# Patient Record
Sex: Male | Born: 1955 | Race: White | Hispanic: No | Marital: Married | Smoking: Current every day smoker
Health system: Southern US, Community
[De-identification: ages and names within clinical notes are randomized; demographics above are authoritative.]

## PROBLEM LIST (undated history)

## (undated) DIAGNOSIS — F418 Other specified anxiety disorders: Secondary | ICD-10-CM

## (undated) DIAGNOSIS — G4730 Sleep apnea, unspecified: Secondary | ICD-10-CM

## (undated) DIAGNOSIS — F410 Panic disorder [episodic paroxysmal anxiety] without agoraphobia: Secondary | ICD-10-CM

## (undated) DIAGNOSIS — Z8669 Personal history of other diseases of the nervous system and sense organs: Secondary | ICD-10-CM

## (undated) DIAGNOSIS — I10 Essential (primary) hypertension: Secondary | ICD-10-CM

## (undated) DIAGNOSIS — E785 Hyperlipidemia, unspecified: Secondary | ICD-10-CM

## (undated) DIAGNOSIS — E6609 Other obesity due to excess calories: Secondary | ICD-10-CM

## (undated) DIAGNOSIS — C801 Malignant (primary) neoplasm, unspecified: Secondary | ICD-10-CM

## (undated) DIAGNOSIS — M1710 Unilateral primary osteoarthritis, unspecified knee: Secondary | ICD-10-CM

## (undated) DIAGNOSIS — I8390 Asymptomatic varicose veins of unspecified lower extremity: Secondary | ICD-10-CM

## (undated) DIAGNOSIS — F17200 Nicotine dependence, unspecified, uncomplicated: Secondary | ICD-10-CM

## (undated) DIAGNOSIS — I2510 Atherosclerotic heart disease of native coronary artery without angina pectoris: Secondary | ICD-10-CM

## (undated) HISTORY — DX: Essential (primary) hypertension: I10

## (undated) HISTORY — PX: TONSILLECTOMY: SUR1361

## (undated) HISTORY — DX: Panic disorder (episodic paroxysmal anxiety): F41.0

## (undated) HISTORY — DX: Nicotine dependence, unspecified, uncomplicated: F17.200

## (undated) HISTORY — DX: Other specified anxiety disorders: F41.8

## (undated) HISTORY — DX: Atherosclerotic heart disease of native coronary artery without angina pectoris: I25.10

## (undated) HISTORY — DX: Other obesity due to excess calories: E66.09

## (undated) HISTORY — DX: Hyperlipidemia, unspecified: E78.5

## (undated) HISTORY — DX: Personal history of other diseases of the nervous system and sense organs: Z86.69

## (undated) HISTORY — DX: Unilateral primary osteoarthritis, unspecified knee: M17.10

## (undated) HISTORY — DX: Asymptomatic varicose veins of unspecified lower extremity: I83.90

---

## 1998-10-11 ENCOUNTER — Ambulatory Visit (HOSPITAL_COMMUNITY): Admission: RE | Admit: 1998-10-11 | Discharge: 1998-10-11 | Payer: Self-pay | Admitting: Family Medicine

## 1998-10-15 ENCOUNTER — Ambulatory Visit: Admission: RE | Admit: 1998-10-15 | Discharge: 1998-10-15 | Payer: Self-pay | Admitting: Family Medicine

## 1998-11-17 ENCOUNTER — Ambulatory Visit: Admission: RE | Admit: 1998-11-17 | Discharge: 1998-11-17 | Payer: Self-pay | Admitting: Family Medicine

## 1999-07-02 ENCOUNTER — Emergency Department (HOSPITAL_COMMUNITY): Admission: EM | Admit: 1999-07-02 | Discharge: 1999-07-02 | Payer: Self-pay | Admitting: Emergency Medicine

## 2002-02-18 ENCOUNTER — Observation Stay (HOSPITAL_COMMUNITY): Admission: EM | Admit: 2002-02-18 | Discharge: 2002-02-19 | Payer: Self-pay | Admitting: *Deleted

## 2002-02-18 ENCOUNTER — Encounter: Payer: Self-pay | Admitting: *Deleted

## 2003-06-03 ENCOUNTER — Encounter: Admission: RE | Admit: 2003-06-03 | Discharge: 2003-06-03 | Payer: Self-pay | Admitting: Family Medicine

## 2003-06-03 ENCOUNTER — Encounter: Payer: Self-pay | Admitting: Family Medicine

## 2004-08-27 HISTORY — PX: EYE SURGERY: SHX253

## 2006-08-27 HISTORY — PX: OTHER SURGICAL HISTORY: SHX169

## 2006-12-08 ENCOUNTER — Inpatient Hospital Stay (HOSPITAL_COMMUNITY): Admission: EM | Admit: 2006-12-08 | Discharge: 2006-12-12 | Payer: Self-pay | Admitting: Emergency Medicine

## 2006-12-08 ENCOUNTER — Ambulatory Visit: Payer: Self-pay | Admitting: Critical Care Medicine

## 2006-12-09 ENCOUNTER — Ambulatory Visit: Payer: Self-pay | Admitting: Thoracic Surgery

## 2006-12-09 ENCOUNTER — Encounter (INDEPENDENT_AMBULATORY_CARE_PROVIDER_SITE_OTHER): Payer: Self-pay | Admitting: *Deleted

## 2006-12-11 ENCOUNTER — Encounter (INDEPENDENT_AMBULATORY_CARE_PROVIDER_SITE_OTHER): Payer: Self-pay | Admitting: Specialist

## 2006-12-19 ENCOUNTER — Ambulatory Visit: Payer: Self-pay | Admitting: Critical Care Medicine

## 2006-12-19 ENCOUNTER — Encounter: Payer: Self-pay | Admitting: Vascular Surgery

## 2006-12-19 ENCOUNTER — Ambulatory Visit (HOSPITAL_COMMUNITY): Admission: RE | Admit: 2006-12-19 | Discharge: 2006-12-19 | Payer: Self-pay | Admitting: Family Medicine

## 2007-12-30 ENCOUNTER — Encounter: Admission: RE | Admit: 2007-12-30 | Discharge: 2007-12-30 | Payer: Self-pay | Admitting: Family Medicine

## 2008-08-24 ENCOUNTER — Inpatient Hospital Stay (HOSPITAL_COMMUNITY): Admission: EM | Admit: 2008-08-24 | Discharge: 2008-08-26 | Payer: Self-pay | Admitting: Emergency Medicine

## 2008-09-06 ENCOUNTER — Observation Stay (HOSPITAL_COMMUNITY): Admission: AD | Admit: 2008-09-06 | Discharge: 2008-09-07 | Payer: Self-pay | Admitting: Cardiology

## 2008-09-06 HISTORY — PX: CARDIAC CATHETERIZATION: SHX172

## 2010-05-25 ENCOUNTER — Ambulatory Visit: Payer: Self-pay | Admitting: Cardiology

## 2010-11-01 ENCOUNTER — Ambulatory Visit (INDEPENDENT_AMBULATORY_CARE_PROVIDER_SITE_OTHER): Payer: 59 | Admitting: Cardiology

## 2010-11-01 DIAGNOSIS — Z9861 Coronary angioplasty status: Secondary | ICD-10-CM

## 2010-11-01 DIAGNOSIS — E785 Hyperlipidemia, unspecified: Secondary | ICD-10-CM

## 2010-11-01 DIAGNOSIS — Z79899 Other long term (current) drug therapy: Secondary | ICD-10-CM

## 2010-12-11 LAB — BASIC METABOLIC PANEL
BUN: 7 mg/dL (ref 6–23)
CO2: 26 mEq/L (ref 19–32)
Calcium: 8.9 mg/dL (ref 8.4–10.5)
Creatinine, Ser: 0.59 mg/dL (ref 0.4–1.5)
Glucose, Bld: 99 mg/dL (ref 70–99)

## 2010-12-11 LAB — CBC
Hemoglobin: 15.4 g/dL (ref 13.0–17.0)
MCHC: 34.5 g/dL (ref 30.0–36.0)
RDW: 12.1 % (ref 11.5–15.5)

## 2011-01-09 NOTE — Consult Note (Signed)
[name], [name] NO.:  [id number]   MEDICAL RECORD NO.:  [id number]          PATIENT TYPE:  INP   LOCATION:  3711                         FACILITY:  [facility]   PHYSICIAN:  Cassell Clement, M.D. DATE OF BIRTH:  05-May-1956   DATE OF CONSULTATION:  08/25/2008  DATE OF DISCHARGE:                                 CONSULTATION   REQUESTING PHYSICIAN:  Eduard Clos, MD, of [facility].   HISTORY:  This is a 55 year old married Caucasian gentleman admitted on  August 24, 2008, with substernal chest pain.  He does not have any  history of prior known coronary artery disease.  He does have a past  history of hyperlipidemia and has been on Lipitor 20 mg daily.  He has  had a past history of low HDL levels.  He also has a past history of  sleep apnea and is using a CPAP machine.  He has had a history of  anxiety and depression and has been on generic Paxil 40 mg a day.  He  also takes daily aspirin.  Eight days ago on Tuesday, he had an episode  of anxiety and chest pain.  They called 911 and the EMTs came to his  house and checked him.  Found that his blood pressure was markedly  elevated at 168/111.  He was given oxygen and the pain resolved after  about 15 minutes and his blood pressure returned to normal and he did  not have to come to the emergency room.  He had another episode of  similar chest discomfort associated with anxiety.  Yesterday, he came to  the emergency room where he was evaluated and admitted on [facility].  His initial cardiac enzymes in the emergency room were normal and  his EKG on admission was normal.  Since admission, the patient has been  pain free and blood pressures have been in the normal range.   The family history reveals that the patient's father had coronary  disease and had bypass surgery, but died of esophageal cancer at age 45.   Social history reveals that the patient is married.  He used to work for  [employer], but  was laid off in July 2009 and has been under increased stress  from being out of work.  He smokes more than a pack of cigarettes a day.  He rarely drinks any beer.  He does not do any regular exercise, which  he attributes to knee pain.  He has had chronic exogenous obesity and  presently weighs in the neighborhood of 320 pounds.   REVIEW OF SYSTEMS:  Reveals that he has sleep apnea, also had a history  of polyps being removed from his lung a year ago by [facility].  All other systems are negative in detail.   The chest pain was described as a pressure, extended across his chest  and into his back.  It extended a little bit down the left arm but  stopped above the elbow.  The patient describes the pain is different  from the previous typical anxiety attack  discomfort that he had  previously on Paxil.   PHYSICAL EXAMINATION:  VITAL SIGNS:  This evening blood pressure is  110/70, pulse is 80 regular, respirations are normal weight is  approximately 320 pounds.  GENERAL APPEARANCE:  A well-developed, well-nourished large gentleman in  no acute distress.  SKIN:  Warm and dry.  HEENT:  Pupils equal and reactive.  Extraocular movements are full.  Sclerae nonicteric.  Mouth and pharynx normal.  Carotids normal.  Jugular venous pressure normal.  Thyroid normal.  No lymphadenopathy.  Chest is clear.  HEART:  A regular rhythm without murmur, gallop, rub, or click.  ABDOMEN:  Obese and nontender.  There is no hepatosplenomegaly.  EXTREMITIES:  Varicose veins in the right.  He does have trace edema on  the right.  The patient has good pedal pulses.   His EKG on admission is within normal limits and shows no ischemic  changes.  Blood work shows normal electrolytes.  LDL was 120, HDL 23,  triglycerides 192, D-dimer is normal.  CBC normal.   IMPRESSION:  1. Hyperlipidemia.  2. Chest pain, myocardial infarction ruled out.  3. Labile hypertension, resolved.  4. History of anxiety and  depression.   RECOMMENDATIONS:  With his large body habitus, he will need a 2-day  protocol for myocardial imaging to get adequate pictures for  interpretation.  With this in mind, we will plan to do this as an  outpatient in our office next week using a 2-day protocol.  He has  problems with his knees and will not be able to use the standard  treadmill, but may be able to use a walking adenosine protocol.  We will  anticipate discharge home on August 26, 2008, with resting over the  weekend and stress testing next week anticipating at home on Crestor,  aspirin, nitrates, and we will add a beta-blocker.   Many thanks for the opportunity to see this pleasant gentleman.           ______________________________  Cassell Clement, M.D.     TB/MEDQ  D:  08/25/2008  T:  08/26/2008  Job:  [id number]   cc:   [facility]

## 2011-01-09 NOTE — Discharge Summary (Signed)
NAMEJADRIAN, [name] NO.:  [id number]   MEDICAL RECORD NO.:  [id number]          PATIENT TYPE:  INP   LOCATION:  2501                         FACILITY:  [facility]   PHYSICIAN:  Peter M. Swaziland, M.D.  DATE OF BIRTH:  11-30-1955   DATE OF ADMISSION:  09/06/2008  DATE OF DISCHARGE:  09/07/2008                               DISCHARGE SUMMARY   HISTORY OF PRESENT ILLNESS:  [name] is a 55 year old male with  history of hypertension, hypercholesterolemia, sleep apnea, and tobacco  abuse who presented with symptoms of increasing angina.  Stress  Cardiolite study was positive showing evidence of anterior septal and  apical ischemia and an ejection fraction of 39%.  The patient was  admitted to undergo cardiac catheterization and potential intervention.  For details of his past medical history, social history, family history,  and physical exam, please see admission history and physical.   HOSPITAL COURSE:  The patient underwent cardiac catheterization on  September 06, 2008.  This demonstrated a focal 90% proximal LAD stenosis  followed by a diffuse 50% disease in the proximal vessel.  There was a  focal 70% stenosis in the midvessel.  The left circumflex artery had 50-  70% stenosis proximally.  The right coronary was a large dominant vessel  without significant obstructive disease.  Left ventricular function  appeared normal.  We proceeded at this time with stenting of the  proximal and mid LAD.  The mid LAD was stented using a 2.5 x 12-mm  Xience stent with an excellent result.  The entire proximal LAD was  stented with 2 overlapping 3.0 x 23 and 3.0 x 18-mm Xience stents.  This  was postdilated up to 3.25 mm distally and 3.5 mm proximally.  This  yielded an excellent angiographic result with no residual stenosis and  TIMI grade 3 flow.  Postprocedure, the patient had no groin  complications.  His groin was sealed with Angio-Seal device.  His CBC  and chemistries  remained normal.  His ECG was normal, and he was  ambulated next morning without difficulty.  He noticed a significant  improvement in his chest symptoms post-procedure.  He was discharged  home on September 07, 2008, in stable condition.   DISCHARGE DIAGNOSIS:  1. New onset of angina.  2. Successful stenting of the proximal and mid left anterior      descending.  3. Hypertension.  4. Hypercholesterolemia.  5. Obstructive sleep apnea.  6. History of tobacco abuse.  7. Family history of coronary artery disease.   DISCHARGE MEDICATIONS:  1. Crestor 10 mg per day.  2. Imdur 60 mg per day.  3. Toprol-XL 25 mg per day.  4. Paxil 40 mg per day.  5. Multivitamin daily.  6. Aspirin 325 mg daily.  7. Plavix 75 mg daily.   The patient is instructed on a heart-healthy diet.  He will increase his  activity slowly.  No lifting for 1 week.  He was given smoking cessation  counseling.  He will follow up with Dr. Patty Sermons in approximately 1  week.   DISCHARGE STATUS:  Improved.  ______________________________  Peter M. Swaziland, M.D.     PMJ/MEDQ  D:  09/07/2008  T:  09/07/2008  Job:  [id number]   cc:   Cassell Clement, M.D.

## 2011-01-09 NOTE — H&P (Signed)
[name], [name] NO.:  [id number]   MEDICAL RECORD NO.:  [id number]          PATIENT TYPE:  EMS   LOCATION:  MAJO                         FACILITY:  [facility]   PHYSICIAN:  Eduard Clos, MDDATE OF BIRTH:  [birth year]   DATE OF ADMISSION:  08/24/2008  DATE OF DISCHARGE:                              HISTORY & PHYSICAL   PRIMARY CARE PHYSICIAN:  Quita Skye. Kindl, M.D.   CHIEF COMPLAINT:  Chest pain.   HISTORY OF PRESENT ILLNESS:  A 55 year old male with known history of  tobacco abuse, hyperlipidemia, depression, who presented with a  complaint of chest pain.  Patient states that he has been having chest  pain over the last one week, which is retrosternal, radiating to back.  Increased with exertion.  Lasts probably around 15-20 minutes.  More  like a pressure-like.  He has been having 3-4 episodes like this over  the week.  He decided to come to the ER today.  Presently is chest  painfree.  Patient also has had some associated shortness of breath.  He  denies any palpitations, nausea, vomiting, diaphoresis, dizziness, loss  of consciousness, weakness of the limbs, or any diarrhea, dysuria, or  discharge.  Patient has had his first set of cardiac enzymes and EKGs,  which were within acceptable limits.  He has been admitted for further  evaluation.   PAST MEDICAL HISTORY:  1. Hyperlipidemia.  2. Depression.   PAST SURGICAL HISTORY:  He has had polyps removed from the right lung,  which the patient stated was benign.   SOCIAL HISTORY:  Patient smoked cigarettes.  He has been advised to quit  smoking.  He denies any alcohol or drug abuse.   FAMILY HISTORY:  Significant for father having CA of the esophagus.   REVIEW OF SYSTEMS:  As per the history of present illness, nothing else  significant.   PHYSICAL EXAMINATION:  Patient examined at bedside.  Not in acute  distress.  Denies any chest pain now.  VITAL SIGNS:  Blood pressure is 119/60, pulse 60 per  minute, temperature  97.2, respirations 18 per minute, O2 sat 96%.  HEENT:  Anicteric.  No pallor.  CHEST:  Bilateral air entry present.  No rhonchi, no crepitation.  HEART:  S1 and S2 heard.  ABDOMEN:  Soft.  Nontender.  Bowel sounds heard.  CNS:  Awake, alert and oriented to time, place, and person.  Moves upper  and lower extremities 5/5.  EXTREMITIES:  Peripheral pulses felt.  No edema.   LABS:  CBC:  WBC 7.2, hemoglobin 17, hematocrit 50, platelets 185,  neutrophils 64%.  Basic metabolic panel:  Sodium 141, potassium 4.3,  chloride 102, glucose 97, BUN 9, creatinine 1.  Creatinine kinase 106,  CK-MB 1.5.  Troponin I is 0.01.  Myoglobin 94.2.  Second set of troponin  is less than 0.05.   Chest x-ray:  Nothing acute.   EKG:  Normal sinus rhythm.  No acute ST-T wave changes.   ASSESSMENT:  1. Chest pain to rule out acute coronary syndrome.  2. Hyperlipidemia.  3. Ongoing tobacco abuse.  4. Depression.   PLAN:  Will admit patient to telemetry.  Check D-dimer.  If D-dimer is  positive, we will need a CT angio to rule out PE.  Will cycle cardiac  markers.  Advise patient to quit smoking.  Patient probably will need a  stress test and further recommendation as condition evolves.      Eduard Clos, MD  Electronically Signed     ANK/MEDQ  D:  08/24/2008  T:  08/24/2008  Job:  [id number]

## 2011-01-09 NOTE — H&P (Signed)
NAME:  TARL, CEPHAS NO.:  [id number]   MEDICAL RECORD NO.:  [id number]          PATIENT TYPE:  OIB   LOCATION:  NA                           FACILITY:  [facility]   PHYSICIAN:  Peter M. Swaziland, M.D.  DATE OF BIRTH:  1955-12-02   DATE OF ADMISSION:  09/06/2008  DATE OF DISCHARGE:                              HISTORY & PHYSICAL   HISTORY OF PRESENT ILLNESS:  Mr. Voorhis is a 55 year old white male who  is admitted to undergo cardiac catheterization.  The patient has a  history of hypertension, hypercholesterolemia, sleep apnea, and tobacco  abuse.  He also has a family history of coronary artery disease.  He  presented recently to the hospital on August 25, 2008, with symptoms  of chest pain.  He states he has been having chest pain off and on for  the past 6 months to 1.  He describes as a mid substernal chest pressure  and he becomes short of breath.  He was admitted on August 25, 2008,  and ruled out for myocardial infarction.  His blood pressure was  treated.  He was on statin therapy.  He subsequently was seen in  followup by Dr. Ronny Flurry on September 02, 2008, where he underwent an  adenosine Cardiolite study.  This showed significant anterior septal and  apical ischemia with ejection fraction of 39%.  This was felt to be a  high-risk and based on these findings, he is now admitted for cardiac  catheterization.   PAST MEDICAL HISTORY:  1. Hypertension.  2. Hypercholesterolemia.  3. Tobacco abuse.  4. Previous removal of a polyp in his bronchus.  5. History of obstructive sleep apnea.  6. Morbid obesity.   CURRENT MEDICATIONS:  1. Nitroglycerin sublingual p.r.n.  2. Aspirin 325 mg per day.  3. Crestor 10 mg per day.  4. Imdur 60 mg per day.  5. Toprol-XL 25 mg one-half tablet daily.  6. Paxil 40 mg per day.  7. Multivitamin daily.   SOCIAL HISTORY:  The patient is married.  He used to work for [employer], but  is currently unemployed.  He has been a  smoker greater than one pack per  day.  He rarely drinks alcohol.  He does not do any regular exercise.   FAMILY HISTORY:  Father had coronary artery disease and bypass surgery  and died of esophageal cancer at age 56.   REVIEW OF SYSTEMS:  He denies any recent hemoptysis, cough, fever, or  chills.  He has had no change in bowel or bladder habits.  Denies any  edema, orthopnea, or PND.  He has had no dizziness or syncope.  Denies  any bleeding disorders, TIA, or stroke.  All other systems are negative  and are reviewed.   PHYSICAL EXAMINATION:  GENERAL:  The patient is a an obese white male in  no apparent distress.  He is well-developed, well-nourished gentleman.  VITAL SIGNS:  Weight is 323-1/2, blood pressures 128/72, pulse 68 and  regular, and respirations were normal.  SKIN:  Warm and dry.  HEENT:  Normocephalic and atraumatic.  Pupils  equal, round, and reactive  to light and accommodation.  Extraocular movements are full.  Sclerae  are clear.  Oropharynx is clear.  NECK:  Supple without JVD, adenopathy, thyromegaly, or bruits.  LUNGS:  Clear to auscultation and percussion.  CARDIAC:  Regular rate and rhythm without gallop, murmur, rub, or click.  ABDOMEN:  Obese, soft, and nontender without mass or hepatosplenomegaly.  EXTREMITIES:  Without edema.  Pedal pulses are 2+ and symmetric.  NEUROLOGIC:  He is alert and oriented x3.  His mood is appropriate.  Cranial nerves II through XII are intact.   LABORATORY DATA:  His resting ECG shows normal sinus rhythm with a  normal ECG.  Chest x-ray showed no active disease.   IMPRESSION:  1. Chest pain with abnormal stress Cardiolite study consistent with      significant atherosclerotic coronary artery disease.  2. Moderate left ventricular dysfunction.  3. Hypertension.  4. Hypercholesterolemia.  5. Obstructive sleep apnea.  6. Morbid obesity.  7. Tobacco abuse.  8. Family history of coronary artery disease.   PLAN:  We will  proceed with diagnostic cardiac catheterization with  potential intervention if indicated.           ______________________________  Peter M. [place], M.D.     PMJ/MEDQ  D:  09/03/2008  T:  09/04/2008  Job:  [id number]   cc:   Cassell Clement, M.D.  Quita Skye Artis Flock, M.D.

## 2011-01-09 NOTE — Cardiovascular Report (Signed)
[name], DUBE NO.:  [id number]   MEDICAL RECORD NO.:  [id number]          PATIENT TYPE:  INP   LOCATION:  2501                         FACILITY:  [facility]   PHYSICIAN:  Peter M. Swaziland, M.D.  DATE OF BIRTH:  02-07-1956   DATE OF PROCEDURE:  DATE OF DISCHARGE:                            CARDIAC CATHETERIZATION   INDICATIONS FOR PROCEDURE:  The patient is a 55 year old white male with  history of hypertension, hypercholesterolemia, morbid obesity, and sleep  apnea who presents with symptoms of increasing chest pain.  He had a  stress Cardiolite study, which was significant for anterior apical  ischemia and depressed ejection fraction of 39%.   PROCEDURES:  Left heart catheterization, coronary and left ventricular  angiography, and intracoronary stenting of the proximal and mid left  anterior descending.  Access via the right femoral artery using the  standard Seldinger technique.   EQUIPMENT:  A 6-French 4-cm right and left Judkins catheter, 6-French  pigtail catheter, 6-French arterial sheath, and 6-French left Voda 4  guide, a Prowater wire, and a 2.5 x 12-mm Voyager balloon, a 2.5 x 12 mm  XIENCE stent, a 3.0 x 23 mm XIENCE stent, a 3.0 x 18 mm  XIENCE stent,  3.25 x 20 mm Delta Voyager balloon, and a 3.5 x 12-mm [place] Voyager balloon.   MEDICATIONS:  Local anesthesia with 1% Xylocaine, nitroglycerin  intracoronary 200 mcg x4, Versed a total of 3 mg IV, fentanyl 50 mcg IV,  Plavix 600 mg p.o., Angiomax bolus at 0.75 mg/kg followed by continuous  use infusion of 1.75 mg/kg per hour.  Subsequent ACT was 350.  Total  contrast 290 mL of Omnipaque.   HEMODYNAMIC DATA:  Aortic pressure was 131/71 with a mean of 95 mmHg,  left ventricular pressures was 129 with EDP of 16 mmHg.   ANGIOGRAPHIC DATA:  The left coronary artery arises and distributes  normally.  The left main coronary artery is normal.   The left anterior descending artery is calcified from the  proximal to  mid vessel.  There is a severe focal 90% stenosis in the very proximal  vessel followed by a long segmental stenosis of 50% throughout the  proximal vessel.  In the midvessel, there is a focal 70% stenosis at an  acute angle.  The mid-to-distal LAD is without significant disease.  The  diagonal branches are very small in caliber.  The left circumflex  coronary is a nondominant vessel giving rise to a single marginal  branch.  There is 50-70% disease in the proximal left circumflex.   The right coronary artery is a large dominant vessel.  Has somewhat  diffuse 10-20% disease in the midvessel and 20% disease involving the  posterolateral branch.   Left ventricular angiography performed in the RAO view demonstrates  normal left ventricular size and contractility with normal systolic  function.  Ejection fraction is estimated at 55%.  There is no  significant mitral insufficiency.   Coronary intervention.  We proceeded at this point with stenting of the  proximal and mid LAD.  After the patient was anticoagulated and guide  shots were obtained, we were able to cross the lesions without  difficulty using a Prowater wire.  We initially predilated the entire  proximal LAD and more focal mid-LAD stenosis.  The mid-LAD was dilated  up to 6 atmospheres and the proximal segment up to 8 atmospheres  diffuse.  We next stented the mid LAD stenosis using 2.5 x 12-mm XIENCE  stent deploying this at 8 and then 12 atmospheres.  This yielded an  excellent angiographic result with 0% residual stenosis and TIMI grade 3  flow.  In the midvessel, there was mild spasm noted proximal to the  stent.  Next, we addressed the entire proximal LAD.  We stented the more  distal segment using a 3.0 x 23-mm XIENCE stent deploying the stent at 9  and then 12 atmospheres with a stent balloon.  The more proximal portion  of the lesion including the more focal 90% stenosis was covered using a  3.0 x 18 mm  XIENCE stent in overlapping fashion with the other stent.  This was also deployed at 9 and then 12 atmospheres with a stent  balloon.  We then postdilated the proximal LAD using a 3.25 x 20 mm [place]  Voyager balloon to 12 atmospheres more distally and 16 atmospheres  proximally.  This still showed underexpansion of the more proximal  segment of the stent, which we then dilated using a 3.5 x 12 mm [place]  Voyager taking this up to 16 atmospheres.  This yielded good stent  expansion throughout with 0% residual stenosis and TIMI grade 3 flow.  The patient was pain free and hemodynamically stable.  At the end of  procedure, his groin was closed using Angio-Seal device with excellent  hemostasis.   FINAL INTERPRETATION:  1. Two-vessel obstructive atherosclerotic coronary artery disease with      severe disease involving the proximal LAD and moderate disease in      the left circumflex coronary artery.  2. Successful intracoronary stenting of the mid-LAD and diffusely the      proximal LAD.  3. Normal left ventricular function.           ______________________________  Peter M. [place], M.D.     PMJ/MEDQ  D:  09/06/2008  T:  09/07/2008  Job:  [id number]   cc:   Cassell Clement, M.D.  Quita Skye Artis Flock, M.D.

## 2011-01-09 NOTE — Discharge Summary (Signed)
NAMEALBIE, ARIZPE NO.:  [id number]   MEDICAL RECORD NO.:  [id number]          PATIENT TYPE:  INP   LOCATION:  3711                         FACILITY:  [facility]   PHYSICIAN:  Richarda Overlie, MD       DATE OF BIRTH:  1956/07/01   DATE OF ADMISSION:  08/24/2008  DATE OF DISCHARGE:  08/26/2008                               DISCHARGE SUMMARY   DISCHARGE DIAGNOSES:  1. Hyperlipidemia.  2. Chest pain, acute coronary syndrome ruled out.  3. Labile hypertension.  4. Resting bradycardia.  5. Anxiety and depression.   SUBJECTIVE:  This is a 55 year old Caucasian male admitted on December  29 with substernal chest pain without any known history of coronary  artery disease.  He does have a history of hyperlipidemia and morbid  obesity.  The patient also has a history of sleep apnea.  He presented  with an episode of acute onset of chest pain in 2 separate episodes  lasting 15-20 minutes, nonexertional in nature.  At presentation his  blood pressure was 160/111, resolved with oxygen and nitro paste.  He  had another episode of chest discomfort which was associated with some  anxiety.  He was admitted to rule out acute coronary syndrome.  Serial  EKGs remained normal.  Serial troponins remained normal.  The patient's  CK was found to be 106 and troponin 0.01.  Chest x-ray remained  negative.  EKG showed normal sinus rhythm.  In the hospital the patient  was found to have sinus bradycardia on telemetry with heart rate in the  40s and 50s.  However, he remained chest pain free.  His D-dimer was  negative.  Cardiology was consulted for risk stratification and to see  if the patient needed a stress test and Dr. Cassell Clement did the  consult.  Because of the patient's big body habitus, the patient would  need an outpatient stress test with a 2-day protocol.  Therefore the  patient is being discharged on cardiology recommendation and cardiology  will be arranging for the stress  test next week.   DISCHARGE MEDICATIONS:  1. Aspirin 325 mg p.o. daily.  2. Imdur 60 mg p.o. daily.  3. Crestor 10 mg p.o. daily.  4. Toprol XL 25 mg half a tablet p.o. daily.  5. Nitroglycerin 0.4 mg sublingual q. 5 minutes x2.  6. Paxil 40 mg p.o. daily.  7. Multivitamin 1 tablet p.o. daily.   FOLLOWUP CONCERNS:  Low sodium diet.  Follow up with primary care  Aubrianna Orchard in 5-7 days.      Richarda Overlie, MD  Electronically Signed    NA/MEDQ  D:  08/26/2008  T:  08/26/2008  Job:  [id number]

## 2011-01-12 NOTE — Op Note (Signed)
NAMEJB, [name] NO.:  [name]   MEDICAL RECORD NO.:  [id number]          PATIENT TYPE:  INP   LOCATION:  5039                         FACILITY:  [facility]   PHYSICIAN:  Charlcie Cradle. Delford Field, MD, FCCPDATE OF BIRTH:  November 06, 1955   DATE OF PROCEDURE:  12/09/2006  DATE OF DISCHARGE:                               OPERATIVE REPORT   SURGEON:  Charlcie Cradle. Delford Field, MD, FCCP.   ANESTHESIA:  1% Xylocaine local.   PREOPERATIVE MEDICATION:  Versed 3 mg, Fentanyl 20 mcg IV push.   INDICATIONS FOR PROCEDURE:  Hemoptysis.  Evaluate for cause.   DESCRIPTION OF PROCEDURE:  The Pentax video bronchoscope was introduced  through the right naris.  The upper airways were visualized and were  unremarkable.  The entire tracheobronchial tree was visualized and was  normal, with the exception of a blood clot seen occluding the right  upper lobe anterior segment.  This was removed; and then beyond this an  adenoid-like polyp seen on the end of a vascular stalk was seen in the  anterior subsegment of the right upper lobe.  This was brushed and bled  quite vigorously.  Therefore no further biopsies or attempts to remove  it were made.   COMPLICATIONS:  Mildly bleeding controlled with topical thrombin and  epinephrine.   IMPRESSION:  Right upper lobe anterior subsegment adenomatous polyp on a  stalk versus carcinoid, with hemoptysis in a smoker.   RECOMMENDATIONS:  Refer to Dr. Karle Plumber and Dr. Danice Goltz for  laser photocoagulation and resection of this lesion.      Charlcie Cradle Delford Field, MD, [facility]  Electronically Signed     PEW/MEDQ  D:  12/09/2006  T:  12/09/2006  Job:  [id number]   cc:   [facility]  Ines Bloomer, M.D.  Gailen Shelter, MD

## 2011-01-12 NOTE — Op Note (Signed)
[name], [name] NO.:  [id number]   MEDICAL RECORD NO.:  [id number]          PATIENT TYPE:  INP   LOCATION:  5039                         FACILITY:  [facility]   PHYSICIAN:  Gailen Shelter, MD  DATE OF BIRTH:  April 02, 1956   DATE OF PROCEDURE:  12/11/2006  DATE OF DISCHARGE:                               OPERATIVE REPORT   PROCEDURE:  Video bronchoscopy.   INDICATIONS FOR PROCEDURE:  Right upper lobe polypoid lesion.  Purpose  of the procedure is for excisional biopsy and potential laser bronch.   SURGEONS:  Dr. Jayme Cloud and Dr. Edwyna Shell.   DESCRIPTION:  The patient is a 55 year old gentleman who has been  admitted on April 13 with a diagnosis of hemoptysis.  He was treated for  pneumonia and bronchitis.  Bronchoscopy by Dr. Shan Levans on April  14 revealed the patient had a polypoid lesion on the right upper lobe  described initially as anterior subsegment.   The patient had options presented to him including potential excision  with electrocautery and following with laser bronch.   DESCRIPTION OF PROCEDURE:  The patient was taken to the operating room  where he was placed on general anesthesia, he underwent diagnostic  bronchoscopy to evaluate the airways.  This showed diffuse chronic  bronchitic changes throughout but no endobronchial lesions on the left.  The right middle lobe and lower lobes were again seen with no  endobronchial lesions noted but diffuse chronic bronchitic changes.  On  the upper lobe all of the subsegments were examined and the right upper  lobe most posterior subsegment a lesion that corresponded to the  previously noted lesion was finally identified.  However it did not  appear to be polypoid but arising actually from the submucosal area.  It  appeared that the lesion had regressed somewhat from the prior  description and prior photos.  At this point, the hot biopsy forceps  were used to take samples as attempts to snare the  lesion failed due to  the large size of the snare compared to the lesion.  The patient had the  biopsies taken x2 with hot biopsy forceps at 20 watts.  The material was  scant.  Examination after the procedure revealed the lesion had totally  been obliterated by the hot forceps.  Laser bronchoscopy at this point  was not indicated.  At this point the bronchoscope was retrieved and the  procedure was terminated.  The patient was taken to the PACU in  satisfactory condition.  Note is made that fluoroscopy was used during  the procedure to ensure that proper positioning of the instruments was  done.  The patient tolerated procedure well.   IMPRESSION:  1. Right upper lobe lesion which could be very well inflammatory.  2. Chronic bronchitic changes.   PLAN:  Will be to follow up pathology.  The patient will continue  therapy with antibiotics for his pneumonia/bronchitis.      Gailen Shelter, MD  Electronically Signed     CLG/MEDQ  D:  12/11/2006  T:  12/11/2006  Job:  [id number]

## 2011-01-12 NOTE — Consult Note (Signed)
NAMECHRSITOPHER, WIK NO.:  [id number]   MEDICAL RECORD NO.:  [id number]          PATIENT TYPE:  INP   LOCATION:  5039                         FACILITY:  [facility]   PHYSICIAN:  Ines Bloomer, M.D. DATE OF BIRTH:  02-Aug-1956   DATE OF CONSULTATION:  DATE OF DISCHARGE:                                 CONSULTATION   CHIEF COMPLAINT:  Hemoptysis x3 days.   HISTORY OF PRESENT ILLNESS:  This 55 year old smoker started having  fairly significant hemoptysis x2 to 3 days.  He was sent to the  emergency room and was admitted by the hospitalist service and had a CT  scan which was negative for pulmonary embolus but showed an infiltrate  in the right upper lobe.  A bronchoscopy done by Dr. Shan Levans  revealed in the right upper lobe anterior segmental polypoid lesion.  There was __________ .  Brushings were taken from this area.  He has a  history of having CPAP for sleep apnea because of his size and is  referred for evaluation.  He smokes one pack per day.  He has had no  history of recent pneumonia, excessive sputum, or wheezing.   PAST MEDICAL HISTORY:  Hypercholesterolemia.   MEDICATIONS:  1. He is on Paxil 40 mg a day.  2. Lipitor 20 mg a day.  3. Sleep apnea with CPAP.   ALLERGIES:  He has no allergies.   FAMILY HISTORY:  Positive for esophageal cancer.   SOCIAL HISTORY:  Is married.  Employed by [employer].  Smoked one  pack a day for 20 years.  He used to drink a lot of alcohol but quit  five or six years ago.   REVIEW OF SYSTEMS:  His weight has been stable.  No angina or atrial  fibrillation.  PULMONARY:  See history of present illness.  GASTROINTESTINAL:  No dysuria, frequent urination, or kidney disease.  VASCULAR:  No claudication, DVT, or TIAs.  NEUROLOGICAL:  no headaches,  blackouts, or seizures.  MUSCULOSKELETAL:  No joint pains or arthritis.  PSYCHIATRIC:  Treated for questionable depression.  HEMATOLOGIC:  No  positive bleeding or  anemia.   PHYSICAL EXAMINATION:  GENERAL:  He is a well-developed, slightly obese  Caucasian male in no acute distress.  HEENT:  Unremarkable.  CHEST:  Clear to auscultation and percussion.  HEART:  Regular sinus rhythm, no murmurs.  ABDOMEN:  Obese.  There is no hepatosplenomegaly.  Bowel sounds are  normal.  EXTREMITIES:  Pulses 2+.  There is no clubbing or edema.  No calf  tenderness.  NEUROLOGIC:  Intact.  He is oriented x3.   I have discussed the situation with him and his family and would  recommend he get a laser bronchoscopy.  We will do this in conjunction  with Dr. Jayme Cloud since this is in the anterior segment of the right  upper lobe.  We hope to remove this with a snare and then do laser  bronchoscopy at the base.  If it is carcinoid, then I recommended that  we have followup.  However, if it turns out to be  a non-small-cell lung  cancer, then I would recommend a right upper lobectomy.      Ines Bloomer, M.D.  Electronically Signed     DPB/MEDQ  D:  12/09/2006  T:  12/09/2006  Job:  [id number]

## 2011-01-12 NOTE — H&P (Signed)
[name], [name] NO.:  [id number]   MEDICAL RECORD NO.:  [id number]          PATIENT TYPE:  EMS   LOCATION:  MAJO                         FACILITY:  [facility]   PHYSICIAN:  Marcellus Scott, MD     DATE OF BIRTH:  Nov 19, 1955   DATE OF ADMISSION:  12/08/2006  DATE OF DISCHARGE:                              HISTORY & PHYSICAL   PRIMARY CARE PHYSICIAN:  Dr. Artis Flock.   CHIEF COMPLAINT:  Hemoptysis since this morning.   HISTORY OF PRESENT ILLNESS:  Mr. [name] is a pleasant young 55 year old  Caucasian male patient with history of dyslipidemia and obstructive  sleep apnea. He was in his usual state of health until approximately 6  a.m. today when after waking up he coughed up a small amount of bright  red liquid blood.  Following this the patient did not have any further  episodes until 9 a.m. when while in the shower he again had episodes of  coughing with hemoptysis which has continued, warranting his arrival to  the emergency room.  He has not coughed up any clots or any sputum.  The  patient said overall he might have coughed up anywhere between 1/2 to  one cup of bright red blood.  Patient denies any preceding complaints  before today.  There is no history of chest pain, dyspnea, wheezing,  chest tightness, fever, chills, rigors or extensive post nasal drip.   The patient gives a history of lifting something very heavy yesterday  with no symptoms after that. There is no history of dizziness, light  headedness, weakness.   PAST MEDICAL HISTORY:  1. Dyslipidemia.  2. Obstructive sleep apnea syndrome.   PSYCHIATRIC HISTORY:  History of panic attacks.   PAST SURGICAL HISTORY:  None.   ALLERGIES:  No known drug allergies.   MEDICATIONS:  1. Lipitor 20 mg p.o. daily.  2. Paxil 40 mg p.o. daily.  3. CPAP.   FAMILY HISTORY:  Patient's father died of metastatic esophageal cancer  in 4.   SOCIAL HISTORY:  The patient is married. 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  He has a 33 pack year smoking history if not more. He  was a heavy drinker, quit 5-10 years ago and drinks socially now.  There  is no history of any drug abuse.   REVIEW OF SYSTEMS:  Over 10 systems reviewed and is noncontributory  except for asymmetrical swelling of the right lower extremity towards  the end of the day for years, and he has had sonography done on them and  DVT ruled out in the past.  Patient does not have any history of new  onset of swelling or pain in that lower extremity. There is no history  of any recent travel either.   PHYSICAL EXAMINATION:  GENERAL:  [name] is a well built and  nourished male patient in no obvious cardiopulmonary or painful  distress.  The patient has a plastic bag next to his bed with tissue  paper and some hemoptysis in it.  VITAL SIGNS:  Blood pressure is 144/85, heart rate  of 60 per minute,  regular. Respirations 16 per minute, saturating at 95% on room air.  Temperature is 98.3.  HEENT EXAM:  Normocephalic, atraumatic. Pupils are equal, round and  reactive to light and accommodation. Oral mucosa with no carious teeth,  no oropharyngeal erythema &no blood at this time.  NECK: No JVD, carotid bruit, lymphadenopathy or goitre. Supple.  RESPIRATORY SYSTEM:  Occasional rhonchi bilaterally, otherwise clear to  auscultation bilaterally.  CARDIOVASCULAR SYSTEM:  First and second heart sounds are heard, no  third or fourth heart sounds, no murmurs, rubs, gallops or clicks.  ABDOMEN:  Obese, there is no tenderness, organomegaly, mass. Bowel  sounds are preserved.  CENTRAL NERVOUS SYSTEM:  The patient is awake, alert, oriented x3 with  no focal deficits.  EXTREMITIES:  With right lower extremity marginally asymmetrically  swollen compared to the left. There is no tenderness, warmth or redness.  Peripheral pulses symmetrtic and normal.  SKIN:  Is without any rashes.   LABORATORY DATA:  CBC with a hemoglobin  of 16.3, hematocrit 47.2, WBC of  6900, platelets 196,000.  PT of 12.9, INR of 1.  D-dimer is negative at  less than 0.22.  Chemistry with sodium of 140, potassium 4.2, chloride 107, BUN 11,  creatinine 1, glucose of 98.   Chest x-ray which has been reported and reviewed by me has no acute  cardiopulmonary findings.   ASSESSMENT AND PLAN:  1. Hemoptysis of unclear etiology, rule out benign causes like an AV      malformation, bronchitis versus malignancy. Will admit patient to      the hospital, type and screen.  Will place two wide bore IV      accesses. Will obtain a CT chest with contrast.  I have consulted      pulmonary.  2. Hyperlipidemia. Will check fasting lipids and continue patient's      home Lipitor.  3. Obstructive sleep apnea. To continue patient's CPAP.  4. Tobacco abuse, for tobacco cessation counseling and nicotine patch.  5. Asymmetrical right lower extremity weakness.  D-dimer is negative.      Will check Doppler though unlikely to be deep venous thrombosis.      Marcellus Scott, MD  Electronically Signed     AH/MEDQ  D:  12/08/2006  T:  12/08/2006  Job:  [id number]   cc:   Quita Skye. Artis Flock, M.D.  Charlcie Cradle Delford Field, MD, FCCP

## 2011-01-12 NOTE — Assessment & Plan Note (Signed)
[facility]                             PULMONARY OFFICE NOTE   IZAAH, WESTMAN                     MRN:          [id number]  DATE:12/19/2006                            DOB:          [birth year]    Mr. Tillett returns today in follow up.  He is a 55 year old male who  had hemoptysis right upper lobe infiltrate, bronchitis. Bronchoscopy  during the hospitalization revealed a polyp type lesion in the right  upper lobe anterior segment with bleeding.  This was biopsied and  removed with laser photocoagulation per Dr. Edwyna Shell and Dr. Jayme Cloud  during this hospitalization and pathology from this just showed atypical  cells.  The patient has had no further hemoptysis since his discharge.  He could not complete the course of Avelox because of a rash.  He  maintains Lipitor 20 mg daily, vitamins daily.  He has held his aspirin.  He is on Paxil 40 mg daily.   EXAMINATION:  Temperature 97, blood pressure 118/80, pulse 65,  saturation 95% room air.  CHEST:  Showed to be clear without evidence of wheeze, rale or rhonchi.  CARDIAC:  Showed a regular rate and rhythm without S3, normal S1, S2.  ABDOMEN:  Soft, nontender.  EXTREMITIES:  Showed no edema or clubbing.  SKIN:  Clear.   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   RECOMMENDATIONS:  Repeat bronchoscopy in 4-6 months for surveillance  purposes.  He is continuing to pursue his smoking cessation and his  currently successful in this regard.  He can resume his aspirin at 81 mg  daily.  No inhaled medicines are necessary and we will return to see  this patient back in follow up in 4 months.     Charlcie Cradle Delford Field, MD, [facility]  Electronically Signed    PEW/MedQ  DD: 12/19/2006  DT: 12/19/2006  Job #: [id number]   cc:   Quita Skye. Artis Flock, M.D.
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
## 2011-01-12 NOTE — H&P (Signed)
[place]  Patient:    [name], [name] Visit Number: [id number] MRN: [id number]          Service Type: MED Location: [phone] Attending Physician:  Junious Silk Dictated by:   Madolyn Frieze. Jens Som, M.D. [facility] Admit Date:  02/18/2002 Discharge Date: 02/19/2002                           History and Physical  The patient is a 55 year old male with a past medical history of anxiety/panic attacks, sleep apnea, and hyperlipidemia who we are asked to evaluate for chest pain and palpitations.  Of note, the patient was scheduled to see Dr. Graciela Husbands in July secondary to palpitations.  He has had chest pain intermittently since age 24.  Over the past five days he has had several episodes.  He describes it as a pressure sensation in his chest or "ton of bricks."  The pain is substernal in location and does not radiate.  There is associated shortness of breath, nausea, vomiting, and mild diaphoresis.  Pain is not pleuritic or positional, not related to food.  It is not related to position. It typically lasts 30 minutes to two hours and resolves spontaneously.  He had a more severe episode this morning and came to the emergency room and is presently pain-free.  We were asked to further evaluate.  Also of note, the patient has had palpitations intermittently for approximately one year.  He describes it as sudden onset with heart "racing."  It lasts for 15-30 seconds and resolves spontaneously.  There is mild dizziness, but no frank syncope.  PRESENT MEDICATIONS:  Lipitor, Paxil, ibuprofen p.r.n.  ALLERGIES:  He has no known drug allergies.  SOCIAL HISTORY:  He is married and lives in [place].  He smokes one and a half packs of cigarettes per day.  He is a former heavy alcohol user.  He has used cocaine previously, but none in the past six years by report.  FAMILY HISTORY:  Positive for coronary artery disease in his father.  PAST MEDICAL  HISTORY:  There is no diabetes mellitus, hypertension.  There is hyperlipidemia.  He has had a tonsillectomy in the past as well as surgery for varicose veins.  He has history of obstructive sleep apnea.  There is no other significant past medical history.  REVIEW OF SYSTEMS:  He denies any headaches, fevers, or chills.  There is no productive cough or hemoptysis.  There is no dysphagia, odynophagia, melena, or hematochezia.  There is no dysuria or hematuria.  There is no rash or seizure activity.  There is no orthopnea, PND, or pedal edema.  There is no claudication noted.  Remaining systems are negative.  PHYSICAL EXAMINATION  VITAL SIGNS:  Blood pressure 147/76, pulse 67.  He is afebrile.  His respiratory rate is 20.  GENERAL:  He is well-developed and well-nourished, in no acute distress.  He is obese.  SKIN:  Warm and dry.  HEENT:  Unremarkable with normal eyelids.  NECK:  Supple with normal upstroke bilaterally and there are no bruits noted. There is no jugular venous distention, no thyromegaly noted.  CHEST:  Clear to auscultation.  Normal expansion.  CARDIOVASCULAR:  Regular rate and rhythm.  Normal S1 and S2.  There were no murmurs, rubs, or gallops noted.  ABDOMEN:  Significant for obesity.  There is no tenderness to palpation or hepatosplenomegaly noted.  I can appreciate no masses  or bruits.  He has 2+ femoral pulses bilaterally and no bruits.  EXTREMITIES:  No edema.  I can palpate no cords.  There is evidence of varicosities.  He has 2+ dorsalis pedis pulses bilaterally.  NEUROLOGIC:  Grossly intact.  LABORATORIES:  His electrocardiogram shows a normal sinus rhythm at a rate of 63.  The axis is normal.  There are no significant ST changes.  There is no delta wave noted and QT is normal.  Chest x-ray shows cardiac enlargement but there is no edema noted.  The remaining laboratories are pending.  DIAGNOSES: 1. Atypical chest pain. 2. Palpitations. 3.  Hyperlipidemia. 4. History of sleep apnea.  PLAN:  Mr. Bruins presents for evaluation of chest pain and palpitations. His symptoms are atypical and his electrocardiogram is normal.  We will admit and rule out myocardial infarction with serial enzymes.  If they are negative then we will plan to risk stratify with an outpatient Cardiolite.  He also has significant palpitations and we will plan an outpatient event monitor for further evaluation and I will see him following this to review the result.  We will treat him with aspirin and add low dose beta blockade until he rules out. Will make further recommendations once we have the above studies. Dictated by:   Madolyn Frieze. Jens Som, M.D. [facility] Attending Physician:  Junious Silk DD:  02/18/02 TD:  02/19/02 Job: [id number]

## 2011-01-12 NOTE — Consult Note (Signed)
[name], [name] NO.:  [id number]   MEDICAL RECORD NO.:  [id number]          PATIENT TYPE:  INP   LOCATION:  1825                         FACILITY:  [facility]   PHYSICIAN:  Charlcie Cradle. Delford Field, MD, FCCPDATE OF BIRTH:  1955/09/25   DATE OF CONSULTATION:  DATE OF DISCHARGE:                                 CONSULTATION   PULMONARY CONSULTATION:   CHIEF COMPLAINT:  Evaluate hemoptysis.   HISTORY OF PRESENT ILLNESS:  A 55 year old white male who awoke this  morning at 6:30 a.m. with coughing up pure blood.  This stopped after a  while and then it started again when he was in the shower at 9:30 then  has been intermittent throughout the day.  He denies any shortness of  breath, chest pain, fever, chills, or sweats.  He had pneumonia years  ago.  He smokes a pack a day for 30 years.  His only medical problems  are hypercholesterolemia, for which he is on Lipitor.  He has also got a  history of depression, on Paxil.  Otherwise, no significant medical  history.  We are asked by the hospitalist to evaluate.  He does have  varicose veins.   PAST MEDICAL HISTORY:  Medical:  1. A history of hypercholesterolemia.  2. Depression.  3. Varicose veins.  4. Smoking.  5. Sleep apnea for which he is on a CPAP.   MEDICATIONS PRIOR TO ADMISSION:  1. Lipitor 20 mg daily.  2. Paxil 40 mg daily.  3. Aspirin 81 mg daily.   SOCIAL HISTORY:  Married, quit heavy drinking many years ago, does a  pack a day for 33 years, works as an Personnel officer, is married.   FAMILY HISTORY:  Noncontributory except for esophageal cancer in a  father.   PAST SURGICAL HISTORY:  None.   MEDICATION/ALLERGIES:  None.   PHYSICAL EXAM:  GENERAL:  A well-developed, well-nourished male in no  acute distress.  VITAL SIGNS:  Stable.  CHEST:  Showed to be clear except for rhonchi in the right upper lung  zone.  CARDIAC EXAM:  Regular rate and rhythm without S3, normal S1 and S2.  ABDOMEN:  Soft,  nontender.  EXTREMITIES:  No edema or clubbing.  SKIN:  Clear.   LABORATORY DATA:  Chest x-ray showed no active infiltrates.  Other lab  data are pending at the time of this dictation.   IMPRESSION:  1. Hemoptysis in a patient with essentially negative x-ray.  2. Varicose veins.  3. Smoking.   RECOMMENDATIONS:  Obtain a CT scan of the chest with pulmonary embolism  protocol and pursue bronchoscopy at 10 a.m. on December 09, 2006.  Further  recommendations will follow.      Charlcie Cradle Delford Field, MD, [facility]  Electronically Signed     PEW/MEDQ  D:  12/08/2006  T:  12/08/2006  Job:  [id number]   cc:   Marcellus Scott, MD

## 2011-01-12 NOTE — Discharge Summary (Signed)
[name], [name] NO.:  [id number]   MEDICAL RECORD NO.:  [id number]          PATIENT TYPE:  INP   LOCATION:  5039                         FACILITY:  [facility]   PHYSICIAN:  Hettie Holstein, D.O.    DATE OF BIRTH:  Dec 29, 1955   DATE OF ADMISSION:  12/08/2006  DATE OF DISCHARGE:  12/12/2006                         DISCHARGE SUMMARY - REFERRING   PRIMARY CARE PHYSICIAN:  Quita Skye. Artis Flock, M.D.   PULMONOLOGIST:  Charlcie Cradle. Delford Field, MD, FCCP.   CVTS SURGEON:  Ines Bloomer, M.D.   FINAL DIAGNOSIS:  Community acquired pneumonia.   SECONDARY DIAGNOSIS:  Hemoptysis with discovery of a right upper lobe  anterior subsegmental polypoid lesion pending further evaluation for  etiology.  He has undergone bronchoscopic sampling.  His hemoptysis has  resolved.  He has, in addition, in evaluation of this lesion, the  patient underwent initial bronchoscopy on December 09, 2006, where he  underwent evaluation as well as removal of blood clot occluding the  right upper lobe anterior segment.  This is where the polypoid lesion  was seen on  the anterior subsegment of the right upper lobe, no further  biopsies or attempts to remove the lesion were made on this first pass.  Subsequently, he underwent consultation with CVTS surgeon, Dr. Edwyna Shell  and underwent a video bronchoscopy on December 11, 2006, with  considerations for laser bronchoscopy, however, this was not indicated  after findings determined in earlier part of the evaluation by Dr.  Jayme Cloud.  Apparently the polypoid lesion had progressed in size and  patient had two biopsies taken with biopsy forceps and these results are  currently pending, to be followed up by Dr. Shan Levans on December 19, 2006.   ADDITIONAL DIAGNOSES:  1. Hyperlipidemia.  2. Obstructive sleep apnea.  3. Tobacco abuse.  4. Venous insufficiency.   DISCHARGE MEDICATIONS:  1. Avelox 400 mg daily x3 more days.  2. He can resume his Paxil 40 mg daily.  3. Lipitor 20 mg daily.  4. Please place aspirin on hold until follow-up with Dr. Delford Field on      December 19, 2006.  5. Multivitamin daily as before.   DISPOSITION:  He should follow with Dr. Shan Levans as recommended as  well as Dr. Artis Flock within one week of discharge.   HISTORY OF PRESENT ILLNESS:  Please refer to the H&P as dictated by Dr.  Marcellus Scott.  However, briefly, [name] is a pleasant 55 year old  male with a history significant for dyslipidemia, obstructive sleep  apnea.  He was in his usual state of health until 6 a.m. on the day of  presentation when he coughed up a small amount of bright red blood.  He  did not have any other episodes until 9 a.m. when, while in the shower  he began to have hemoptysis and subsequently brought himself to the  emergency department.  He denies any preceding complaints, no history of  chest pain, dyspnea, wheezing, shortness of breath.  Patient gives a  history of exerting himself the day previously.   HOSPITAL COURSE:  Patient was admitted.  His chest x-ray showed no acute  pulmonary findings.  He was admitted for evaluation of hemoptysis  without a clear etiology.  His initial CBC revealed wbc of 6900,  hemoglobin 16.3.  CT scan of his chest revealed no evidence of pulmonary  emboli, however, there was a focal right upper lobe bronchitis and early  right upper lobe infiltrate, no other findings were described with the  exception of dense coronary artery calcifications.  Pulmonary was  consulted for further recommendations and evaluation for hemoptysis.  He  underwent bronchoscopy as noted above with findings of a blood clot that  was removed.  No biopsies were performed in that time.  He underwent  subsequent bronchoscopy where biopsies were taken.  He underwent  consultation with Dr. Edwyna Shell.  In addition, he underwent formal  pulmonary function studies, however, the results have not been  confirmed.  His FEV1 was 97% predicted  and his FVC was 90% predicted and  his DLCO was 103% predicted.  His laboratory data and pathology results  are pending at the time of discharge to be followed up by Dr. Shan Levans on December 19, 2006.   He had reported some chronic right lower extremity swelling for many  years now, unchanged from previously.  This can be followed in the  outpatient setting by his primary care physician.      Hettie Holstein, D.O.  Electronically Signed     ESS/MEDQ  D:  12/12/2006  T:  12/12/2006  Job:  [id number]   cc:   Quita Skye. Artis Flock, M.D.  Charlcie Cradle Delford Field, MD, FCCP

## 2011-01-18 ENCOUNTER — Other Ambulatory Visit: Payer: Self-pay | Admitting: *Deleted

## 2011-01-18 DIAGNOSIS — E785 Hyperlipidemia, unspecified: Secondary | ICD-10-CM

## 2011-01-18 MED ORDER — ROSUVASTATIN CALCIUM 20 MG PO TABS: 20.0000 mg | ORAL_TABLET | Freq: Every day | ORAL | Status: DC

## 2011-01-18 NOTE — Telephone Encounter (Signed)
Refilled meds per fax request.  

## 2011-01-31 ENCOUNTER — Telehealth: Payer: Self-pay | Admitting: Cardiology

## 2011-01-31 DIAGNOSIS — R0989 Other specified symptoms and signs involving the circulatory and respiratory systems: Secondary | ICD-10-CM

## 2011-01-31 MED ORDER — AZITHROMYCIN 250 MG PO TABS: 250.0000 mg | ORAL_TABLET | Freq: Every day | ORAL | Status: AC

## 2011-01-31 NOTE — Telephone Encounter (Signed)
Continue Mucinex and start a Z-Pak as directed

## 2011-01-31 NOTE — Telephone Encounter (Signed)
PT SAID CHEST CONGESTED, BEEN TAKING MUSINEX BUT NOT HELPING THIS. CAN SOMETHING BE CALLED IN FOR HIM? USES [facility] [address] CALL PT AND LET HIM KNOW.

## 2011-01-31 NOTE — Telephone Encounter (Signed)
Please advise 

## 2011-01-31 NOTE — Telephone Encounter (Signed)
Advised patient

## 2011-04-06 ENCOUNTER — Other Ambulatory Visit: Payer: Self-pay | Admitting: *Deleted

## 2011-04-06 DIAGNOSIS — I519 Heart disease, unspecified: Secondary | ICD-10-CM

## 2011-04-06 MED ORDER — CLOPIDOGREL BISULFATE 75 MG PO TABS: 75.0000 mg | ORAL_TABLET | Freq: Every day | ORAL | Status: DC

## 2011-04-06 NOTE — Telephone Encounter (Signed)
Refilled generic plavix 

## 2011-05-02 ENCOUNTER — Other Ambulatory Visit: Payer: Self-pay | Admitting: Cardiology

## 2011-05-02 DIAGNOSIS — I10 Essential (primary) hypertension: Secondary | ICD-10-CM

## 2011-05-02 DIAGNOSIS — E785 Hyperlipidemia, unspecified: Secondary | ICD-10-CM

## 2011-05-31 LAB — DIFFERENTIAL
Basophils Relative: 1 % (ref 0–1)
Monocytes Relative: 5 % (ref 3–12)
Neutro Abs: 4.6 10*3/uL (ref 1.7–7.7)
Neutrophils Relative %: 64 % (ref 43–77)

## 2011-05-31 LAB — CBC
MCHC: 33.7 g/dL (ref 30.0–36.0)
RBC: 4.96 MIL/uL (ref 4.22–5.81)
WBC: 7.2 10*3/uL (ref 4.0–10.5)

## 2011-05-31 LAB — LIPID PANEL
HDL: 22 mg/dL — ABNORMAL LOW (ref >39)
HDL: 23 mg/dL — ABNORMAL LOW (ref >39)
LDL Cholesterol: 120 mg/dL — ABNORMAL HIGH (ref 0–99)
Total CHOL/HDL Ratio: 7.9 RATIO
Total CHOL/HDL Ratio: 7.9 RATIO
Triglycerides: 192 mg/dL — ABNORMAL HIGH (ref <150)
VLDL: 38 mg/dL (ref 0–40)

## 2011-05-31 LAB — POCT I-STAT, CHEM 8
Chloride: 102 mEq/L (ref 96–112)
Glucose, Bld: 97 mg/dL (ref 70–99)
HCT: 50 % (ref 39.0–52.0)
Potassium: 4.3 mEq/L (ref 3.5–5.1)

## 2011-05-31 LAB — POCT CARDIAC MARKERS
CKMB, poc: <1 ng/mL — ABNORMAL LOW (ref 1.0–8.0)
Troponin i, poc: <0.05 ng/mL (ref 0.00–0.09)

## 2011-05-31 LAB — CARDIAC PANEL(CRET KIN+CKTOT+MB+TROPI)
CK, MB: 1.8 ng/mL (ref 0.3–4.0)
Relative Index: 1.8 (ref 0.0–2.5)
Troponin I: 0.03 ng/mL (ref 0.00–0.06)

## 2011-05-31 LAB — TSH: TSH: 3.607 u[IU]/mL (ref 0.350–4.500)

## 2011-05-31 LAB — TROPONIN I: Troponin I: 0.02 ng/mL (ref 0.00–0.06)

## 2011-05-31 LAB — D-DIMER, QUANTITATIVE: D-Dimer, Quant: 0.31 ug/mL-FEU (ref 0.00–0.48)

## 2011-06-14 ENCOUNTER — Other Ambulatory Visit: Payer: Self-pay | Admitting: *Deleted

## 2011-06-14 DIAGNOSIS — E785 Hyperlipidemia, unspecified: Secondary | ICD-10-CM

## 2011-06-14 MED ORDER — ROSUVASTATIN CALCIUM 20 MG PO TABS: 20.0000 mg | ORAL_TABLET | Freq: Every day | ORAL | Status: DC

## 2011-06-19 ENCOUNTER — Other Ambulatory Visit: Payer: Self-pay | Admitting: Cardiology

## 2011-06-19 DIAGNOSIS — E785 Hyperlipidemia, unspecified: Secondary | ICD-10-CM

## 2011-06-19 MED ORDER — LOSARTAN POTASSIUM 100 MG PO TABS: 100.0000 mg | ORAL_TABLET | Freq: Every day | ORAL | Status: DC

## 2011-06-19 MED ORDER — ROSUVASTATIN CALCIUM 20 MG PO TABS: 20.0000 mg | ORAL_TABLET | Freq: Every day | ORAL | Status: DC

## 2011-06-20 ENCOUNTER — Encounter: Payer: Self-pay | Admitting: *Deleted

## 2011-06-20 DIAGNOSIS — M1710 Unilateral primary osteoarthritis, unspecified knee: Secondary | ICD-10-CM | POA: Insufficient documentation

## 2011-06-20 DIAGNOSIS — F410 Panic disorder [episodic paroxysmal anxiety] without agoraphobia: Secondary | ICD-10-CM | POA: Insufficient documentation

## 2011-06-20 DIAGNOSIS — F418 Other specified anxiety disorders: Secondary | ICD-10-CM | POA: Insufficient documentation

## 2011-06-20 DIAGNOSIS — Z8669 Personal history of other diseases of the nervous system and sense organs: Secondary | ICD-10-CM | POA: Insufficient documentation

## 2011-06-20 DIAGNOSIS — E6609 Other obesity due to excess calories: Secondary | ICD-10-CM | POA: Insufficient documentation

## 2011-06-22 ENCOUNTER — Telehealth: Payer: Self-pay | Admitting: Cardiology

## 2011-06-22 NOTE — Telephone Encounter (Signed)
Pt wants refill of crestor called to [facility] in [place]. please call when done. His insurance changed in October does this matter.

## 2011-06-25 ENCOUNTER — Ambulatory Visit (INDEPENDENT_AMBULATORY_CARE_PROVIDER_SITE_OTHER): Payer: BC Managed Care – PPO | Admitting: Cardiology

## 2011-06-25 ENCOUNTER — Other Ambulatory Visit: Payer: BC Managed Care – PPO | Admitting: *Deleted

## 2011-06-25 ENCOUNTER — Other Ambulatory Visit: Payer: 59 | Admitting: *Deleted

## 2011-06-25 ENCOUNTER — Encounter: Payer: Self-pay | Admitting: Cardiology

## 2011-06-25 VITALS — BP 124/78 | HR 66 | Ht 73.0 in | Wt 333.0 lb

## 2011-06-25 DIAGNOSIS — I2510 Atherosclerotic heart disease of native coronary artery without angina pectoris: Secondary | ICD-10-CM

## 2011-06-25 DIAGNOSIS — E7800 Pure hypercholesterolemia, unspecified: Secondary | ICD-10-CM

## 2011-06-25 DIAGNOSIS — I119 Hypertensive heart disease without heart failure: Secondary | ICD-10-CM

## 2011-06-25 DIAGNOSIS — Z716 Tobacco abuse counseling: Secondary | ICD-10-CM

## 2011-06-25 DIAGNOSIS — Z7189 Other specified counseling: Secondary | ICD-10-CM

## 2011-06-25 DIAGNOSIS — I259 Chronic ischemic heart disease, unspecified: Secondary | ICD-10-CM | POA: Insufficient documentation

## 2011-06-25 DIAGNOSIS — E785 Hyperlipidemia, unspecified: Secondary | ICD-10-CM

## 2011-06-25 DIAGNOSIS — F410 Panic disorder [episodic paroxysmal anxiety] without agoraphobia: Secondary | ICD-10-CM

## 2011-06-25 DIAGNOSIS — I10 Essential (primary) hypertension: Secondary | ICD-10-CM

## 2011-06-25 LAB — HEPATIC FUNCTION PANEL
ALT: 24 U/L (ref 0–53)
AST: 18 U/L (ref 0–37)
Albumin: 4.4 g/dL (ref 3.5–5.2)
Alkaline Phosphatase: 54 U/L (ref 39–117)
Total Bilirubin: 0.4 mg/dL (ref 0.3–1.2)

## 2011-06-25 LAB — BASIC METABOLIC PANEL
BUN: 13 mg/dL (ref 6–23)
Chloride: 107 mEq/L (ref 96–112)
GFR: 107.47 mL/min (ref >60.00)
Glucose, Bld: 104 mg/dL — ABNORMAL HIGH (ref 70–99)
Potassium: 4.1 mEq/L (ref 3.5–5.1)
Sodium: 139 mEq/L (ref 135–145)

## 2011-06-25 LAB — LIPID PANEL: Cholesterol: 180 mg/dL (ref 0–200)

## 2011-06-25 NOTE — Assessment & Plan Note (Signed)
The patient has not had any recurrent chest pain or shortness of breath.  Is not aware of any palpitations.  He's had no dizziness or syncope.

## 2011-06-25 NOTE — Assessment & Plan Note (Signed)
The patient has a history of hypercholesterolemia.  Unfortunately, he has been out of his Crestor for the past month.  We are checking lab work today.  We filled out papers that his insurance would pay for the Crestor.

## 2011-06-25 NOTE — Progress Notes (Signed)
Addended by: Alma Friendly on: 06/25/2011 10:42 AM   Modules accepted: Orders

## 2011-06-25 NOTE — Patient Instructions (Addendum)
Your physician recommends that you continue on your current medications as directed. Please refer to the Current Medication list given to you today.  Will obtain labs and call you with results.  Your physician encouraged you to lose weight for better health.  Your physician discussed the hazards of tobacco use. Tobacco use cessation is recommended and techniques and options to help you quit were discussed.  Samples of Crestor given until PA from [employer]  Your physician wants you to follow-up in: 6 months You will receive a reminder letter in the mail two months in advance. If you don't receive a letter, please call our office to schedule the follow-up appointment.

## 2011-06-25 NOTE — Progress Notes (Signed)
[name] Date of Birth:  July 31, 1956 [facility] [address]   [address] [place], [place]  [address] [phone]           Fax   [phone]  History of Present Illness: This pleasant 55 year old gentleman is seen for a six-month followup office visit.  Has a history of known ischemic heart disease.  On 09/06/08.  He underwent successful stenting of his mid LAD and proximal LAD by Dr. Swaziland.  He presented with unstable angina.  The ears had no recurrent angina since his successful stenting.  He has risk factors of high cholesterol, family history, and unfortunately, ongoing cigarette abuse.  Continues to be significantly overweight.  Smoking about a half a pack a day.  Patient has a remote history of panic attacks and is on Paxil.  No recent exacerbation.  Current Outpatient Prescriptions  Medication Sig Dispense Refill  . aspirin 325 MG tablet Take 325 mg by mouth daily.        . clopidogrel (PLAVIX) 75 MG tablet Take 1 tablet (75 mg total) by mouth daily.  30 tablet  5  . losartan (COZAAR) 100 MG tablet Take 1 tablet (100 mg total) by mouth daily.  30 tablet  6  . metoprolol succinate (TOPROL-XL) 25 MG 24 hr tablet Take 25 mg by mouth daily. Taking 1/2 daily       . Multiple Vitamin (MULTIVITAMIN) tablet Take 1 tablet by mouth daily.        . nitroGLYCERIN (NITROSTAT) 0.4 MG SL tablet Place 0.4 mg under the tongue every 5 (five) minutes as needed.        Marland Kitchen PARoxetine (PAXIL) 20 MG tablet Take 20 mg by mouth every morning.        . rosuvastatin (CRESTOR) 20 MG tablet Take 1 tablet (20 mg total) by mouth at bedtime.  90 tablet  1    No Known Allergies  Patient Active Problem List  Diagnoses  . Panic attacks  . Arthritis of knee  . Exogenous obesity  . History of sleep apnea  . Depression with anxiety    History  Smoking status  . Current Everyday Smoker -- 0.5 packs/day  . Types: Cigarettes  Smokeless tobacco  . Not on file    History    Alcohol Use     Family History  Problem Relation Age of Onset  . Heart disease Father     had cabg  . Cancer Father 78    esophageal  . Cancer Brother     lung cancer    Review of Systems: Constitutional: no fever chills diaphoresis or fatigue or change in weight.  Head and neck: no hearing loss, no epistaxis, no photophobia or visual disturbance. Respiratory: No cough, shortness of breath or wheezing. Cardiovascular: No chest pain peripheral edema, palpitations. Gastrointestinal: No abdominal distention, no abdominal pain, no change in bowel habits hematochezia or melena. Genitourinary: No dysuria, no frequency, no urgency, no nocturia. Musculoskeletal:No arthralgias, no back pain, no gait disturbance or myalgias. Neurological: No dizziness, no headaches, no numbness, no seizures, no syncope, no weakness, no tremors. Hematologic: No lymphadenopathy, no easy bruising. Psychiatric: No confusion, no hallucinations, no sleep disturbance.    Physical Exam: Filed Vitals:   06/25/11 1006  BP: 124/78  Pulse: 66   Gen. appearance reveals a very large gentleman in no acute distress.Pupils equal and reactive.   Extraocular Movements are full.  There is no scleral icterus.  The mouth and  pharynx are normal.  The neck is supple.  The carotids reveal no bruits.  The jugular venous pressure is normal.  The thyroid is not enlarged.  There is no lymphadenopathy.  The chest is clear to percussion and auscultation. There are no rales or rhonchi. Expansion of the chest is symmetrical.  The precordium is quiet.  The first heart sound is normal.  The second heart sound is physiologically split.  There is no murmur gallop rub or click.  There is no abnormal lift or heave.  The abdomen is obese.  There is no organomegaly or massThe pedal pulses are good.  There is no phlebitis or edema.  There is no cyanosis or clubbing. Strength is normal and symmetrical in all extremities.  There is no  lateralizing weakness.  There are no sensory deficits.  The skin is warm and dry.  There is no rash.   Assessment / Plan: I again counseled on the importance of cigarette cessation.  Patient will remain on same medication and recheck in 6 months.  His lipids may not be as good.  This time because of being off Crestor for the past month

## 2011-06-25 NOTE — Assessment & Plan Note (Signed)
No recent panic attacks.  He remains on Paxil 20 mg daily.

## 2011-06-25 NOTE — Telephone Encounter (Signed)
I was out of the office Friday. Patient here for office visit.  Gathered samples and prior authorization for medication already in place.

## 2011-06-26 ENCOUNTER — Telehealth: Payer: Self-pay | Admitting: *Deleted

## 2011-06-26 NOTE — Progress Notes (Signed)
Advised of labs 

## 2011-06-26 NOTE — Telephone Encounter (Signed)
Advised of lab resultsw

## 2011-06-26 NOTE — Telephone Encounter (Signed)
Message copied by Burnell Blanks on Tue Jun 26, 2011  9:50 AM ------      Message from: Cassell Clement      Created: Mon Jun 25, 2011  8:59 PM       The LFTs are good.  Bl sugar sllightly high. The Lipids are still too high but should improve when he is back on his Crestor.

## 2011-07-13 ENCOUNTER — Other Ambulatory Visit: Payer: Self-pay | Admitting: *Deleted

## 2011-07-13 ENCOUNTER — Telehealth: Payer: Self-pay | Admitting: *Deleted

## 2011-07-13 DIAGNOSIS — E785 Hyperlipidemia, unspecified: Secondary | ICD-10-CM

## 2011-07-13 MED ORDER — ROSUVASTATIN CALCIUM 20 MG PO TABS: 20.0000 mg | ORAL_TABLET | Freq: Every day | ORAL | Status: DC

## 2011-07-13 NOTE — Telephone Encounter (Signed)
Adv. Patient we refilled crestor

## 2011-07-13 NOTE — Telephone Encounter (Signed)
Refilled crestor,received PA from [employer]

## 2011-09-09 ENCOUNTER — Other Ambulatory Visit: Payer: Self-pay | Admitting: Cardiology

## 2011-10-08 ENCOUNTER — Other Ambulatory Visit: Payer: Self-pay | Admitting: Cardiology

## 2011-10-08 NOTE — Telephone Encounter (Signed)
Refilled plavix 

## 2011-10-11 ENCOUNTER — Other Ambulatory Visit: Payer: Self-pay | Admitting: Cardiology

## 2011-10-11 DIAGNOSIS — F329 Major depressive disorder, single episode, unspecified: Secondary | ICD-10-CM

## 2011-10-11 DIAGNOSIS — I259 Chronic ischemic heart disease, unspecified: Secondary | ICD-10-CM

## 2011-10-11 DIAGNOSIS — F32A Depression, unspecified: Secondary | ICD-10-CM

## 2011-10-11 NOTE — Telephone Encounter (Signed)
Refill on paxil and plavix

## 2011-11-15 ENCOUNTER — Other Ambulatory Visit: Payer: Self-pay | Admitting: Cardiology

## 2011-12-06 ENCOUNTER — Telehealth: Payer: Self-pay | Admitting: Cardiology

## 2011-12-06 NOTE — Telephone Encounter (Signed)
Left message

## 2011-12-06 NOTE — Telephone Encounter (Signed)
Please return call to patient at hm# [phone]  Patient has questions about Mobic prescription and possible drug interaction. Patient request return call on hm# [phone]

## 2011-12-14 NOTE — Telephone Encounter (Signed)
Patient never called back.

## 2012-01-03 ENCOUNTER — Other Ambulatory Visit (INDEPENDENT_AMBULATORY_CARE_PROVIDER_SITE_OTHER): Payer: BC Managed Care – PPO

## 2012-01-03 ENCOUNTER — Ambulatory Visit (INDEPENDENT_AMBULATORY_CARE_PROVIDER_SITE_OTHER): Payer: BC Managed Care – PPO | Admitting: Cardiology

## 2012-01-03 ENCOUNTER — Encounter: Payer: Self-pay | Admitting: Cardiology

## 2012-01-03 VITALS — BP 118/80 | HR 66 | Ht 73.0 in | Wt 336.0 lb

## 2012-01-03 DIAGNOSIS — I259 Chronic ischemic heart disease, unspecified: Secondary | ICD-10-CM

## 2012-01-03 DIAGNOSIS — E7800 Pure hypercholesterolemia, unspecified: Secondary | ICD-10-CM

## 2012-01-03 DIAGNOSIS — I2510 Atherosclerotic heart disease of native coronary artery without angina pectoris: Secondary | ICD-10-CM

## 2012-01-03 DIAGNOSIS — R42 Dizziness and giddiness: Secondary | ICD-10-CM

## 2012-01-03 DIAGNOSIS — R5383 Other fatigue: Secondary | ICD-10-CM | POA: Insufficient documentation

## 2012-01-03 DIAGNOSIS — R5381 Other malaise: Secondary | ICD-10-CM

## 2012-01-03 LAB — HEPATIC FUNCTION PANEL
Albumin: 4 g/dL (ref 3.5–5.2)
Alkaline Phosphatase: 56 U/L (ref 39–117)
Total Protein: 6.9 g/dL (ref 6.0–8.3)

## 2012-01-03 LAB — BASIC METABOLIC PANEL
CO2: 26 mEq/L (ref 19–32)
Calcium: 9 mg/dL (ref 8.4–10.5)
Creatinine, Ser: 0.9 mg/dL (ref 0.4–1.5)
GFR: 98.67 mL/min (ref >60.00)
Glucose, Bld: 100 mg/dL — ABNORMAL HIGH (ref 70–99)
Sodium: 141 mEq/L (ref 135–145)

## 2012-01-03 LAB — LIPID PANEL
HDL: 32 mg/dL — ABNORMAL LOW (ref >39.00)
Triglycerides: 116 mg/dL (ref 0.0–149.0)
VLDL: 23.2 mg/dL (ref 0.0–40.0)

## 2012-01-03 MED ORDER — MECLIZINE HCL 25 MG PO TABS: ORAL_TABLET | ORAL | Status: DC

## 2012-01-03 NOTE — Assessment & Plan Note (Signed)
No recurrent angina pectoris. 

## 2012-01-03 NOTE — Patient Instructions (Signed)
Take Meclizine 25 mg every 6 hours if needed for dizziness.   Your physician wants you to follow-up in: 6 months with fasting labs. You will receive a reminder letter in the mail two months in advance. If you don't receive a letter, please call our office to schedule the follow-up appointment.

## 2012-01-03 NOTE — Assessment & Plan Note (Signed)
Patient complains of malaise and fatigue.  He does not have any history of hypothyroidism.  Clinically he is euthyroid.  We will plan to check thyroid function his next visit

## 2012-01-03 NOTE — Progress Notes (Signed)
[name] Date of Birth:  May 30, 1956 [facility] [address] [place], [place]  [address] [phone]  Fax   [phone]  HPI: This pleasant 56 year old gentleman is seen for a six-month followup office visit.  As a history of known ischemic heart disease.  On 09/06/08 he underwent successful stenting of his mid LAD and proximal LAD by Dr. Swaziland.  These were drug-eluting stents.  Patient has not been expressing any recurrent angina pectoris.  He slowly he has had symptoms of acute vertigo associated with some sinus congestion.  He missed several days of work last week because of his unsteadiness.  Current Outpatient Prescriptions  Medication Sig Dispense Refill  . aspirin 325 MG tablet Take 325 mg by mouth daily.        . clopidogrel (PLAVIX) 75 MG tablet take 1 tablet by mouth once daily  30 tablet  5  . losartan (COZAAR) 100 MG tablet Take 1 tablet (100 mg total) by mouth daily.  30 tablet  6  . metoprolol succinate (TOPROL-XL) 25 MG 24 hr tablet TAKE 1/2 TABLET DAILY  30 tablet  6  . Multiple Vitamin (MULTIVITAMIN) tablet Take 1 tablet by mouth daily.        . nitroGLYCERIN (NITROSTAT) 0.4 MG SL tablet Place 0.4 mg under the tongue every 5 (five) minutes as needed.        Marland Kitchen PARoxetine (PAXIL) 40 MG tablet take 1 tablet by mouth once daily  30 tablet  6  . rosuvastatin (CRESTOR) 20 MG tablet Take 1 tablet (20 mg total) by mouth at bedtime.  90 tablet  1  . meclizine (ANTIVERT) 25 MG tablet Take 25 mg every 6 hours if needed  30 tablet  3  . DISCONTD: PARoxetine (PAXIL) 20 MG tablet Take 20 mg by mouth every morning.          No Known Allergies  Patient Active Problem List  Diagnoses  . Panic attacks  . Arthritis of knee  . Exogenous obesity  . History of sleep apnea  . Depression with anxiety  . Ischemic heart disease  . Hypercholesterolemia  . Malaise and fatigue    History  Smoking status  . Current Everyday Smoker -- 0.5 packs/day  .  Types: Cigarettes  Smokeless tobacco  . Not on file    History  Alcohol Use     Family History  Problem Relation Age of Onset  . Heart disease Father     had cabg  . Cancer Father 11    esophageal  . Cancer Brother     lung cancer    Review of Systems: The patient denies any heat or cold intolerance.  No weight gain or weight loss.  The patient denies headaches or blurry vision.  There is no cough or sputum production.  The patient denies dizziness.  There is no hematuria or hematochezia.  The patient denies any muscle aches or arthritis.  The patient denies any rash.  The patient denies frequent falling or instability.  There is no history of depression or anxiety.  All other systems were reviewed and are negative.   Physical Exam: Filed Vitals:   01/03/12 1031  BP: 118/80  Pulse: 66   the general appearance reveals a large gentleman in no acute distress.The head and neck exam reveals pupils equal and reactive.  Extraocular movements are full.  There is nystagmus looking to the right .There is no scleral icterus.  The mouth and pharynx are  normal.  The neck is supple.  The carotids reveal no bruits.  The jugular venous pressure is normal.  The  thyroid is not enlarged.  There is no lymphadenopathy.  The chest is clear to percussion and auscultation.  There are no rales or rhonchi.  Expansion of the chest is symmetrical.  The precordium is quiet.  The first heart sound is normal.  The second heart sound is physiologically split.  There is no murmur gallop rub or click.  There is no abnormal lift or heave.  The abdomen is soft and nontender.  The bowel sounds are normal.  The liver and spleen are not enlarged.  There are no abdominal masses.  There are no abdominal bruits.  Extremities reveal good pedal pulses.  There is no phlebitis or edema.  There is no cyanosis or clubbing.  Strength is normal and symmetrical in all extremities.  There is no lateralizing weakness.  There are no sensory  deficits.  The skin is warm and dry.  There is no rash.      Assessment / Plan: Continue same medication.  Recheck in 6 months for followup office visit EKG and fasting lipid panel hepatic function panel nasal metabolic panel CBC and TSH.  Add meclizine when necessary for vertigo.  The patient was also counseled in cigarette cessation.  He is smoking between a half and a full pack of cigarettes daily

## 2012-01-03 NOTE — Progress Notes (Signed)
Quick Note:  Please report to patient. The recent labs are stable. Continue same medication and careful diet. ______ 

## 2012-01-03 NOTE — Assessment & Plan Note (Signed)
The patient is experiencing benign positional vertigo.  No associated nausea.  Exam does reveal nystagmus looking to the right.  We will treat him with meclizine 25 mg every 6 hours when necessary.

## 2012-01-04 ENCOUNTER — Telehealth: Payer: Self-pay | Admitting: *Deleted

## 2012-01-04 NOTE — Telephone Encounter (Signed)
Advised of labs 

## 2012-01-04 NOTE — Telephone Encounter (Signed)
Message copied by Burnell Blanks on Fri Jan 04, 2012 12:33 PM ------      Message from: Cassell Clement      Created: Thu Jan 03, 2012  2:36 PM       Please report to patient.  The recent labs are stable. Continue same medication and careful diet.

## 2012-01-07 ENCOUNTER — Other Ambulatory Visit: Payer: Self-pay | Admitting: Cardiology

## 2012-01-07 NOTE — Telephone Encounter (Signed)
..   Requested Prescriptions   Pending Prescriptions Disp Refills  . losartan (COZAAR) 100 MG tablet [Pharmacy Med Name: LOSARTAN POTASSIUM 100 MG TAB] 30 tablet 6    Sig: take 1 tablet by mouth once daily

## 2012-01-17 ENCOUNTER — Other Ambulatory Visit: Payer: Self-pay | Admitting: Cardiology

## 2012-02-12 ENCOUNTER — Encounter: Payer: Self-pay | Admitting: Cardiology

## 2012-07-15 ENCOUNTER — Ambulatory Visit (INDEPENDENT_AMBULATORY_CARE_PROVIDER_SITE_OTHER): Payer: 59 | Admitting: Cardiology

## 2012-07-15 ENCOUNTER — Encounter: Payer: Self-pay | Admitting: Cardiology

## 2012-07-15 VITALS — BP 132/72 | HR 61 | Ht 73.0 in | Wt 338.0 lb

## 2012-07-15 DIAGNOSIS — I259 Chronic ischemic heart disease, unspecified: Secondary | ICD-10-CM

## 2012-07-15 DIAGNOSIS — E7800 Pure hypercholesterolemia, unspecified: Secondary | ICD-10-CM

## 2012-07-15 MED ORDER — FUROSEMIDE 20 MG PO TABS: 20.0000 mg | ORAL_TABLET | Freq: Every day | ORAL | Status: DC | PRN

## 2012-07-15 NOTE — Progress Notes (Signed)
[name] Date of Birth:  February 25, 1956 [facility] [address] [place], [place]  [address] [phone]  Fax   [phone]  HPI: This pleasant 56 year old gentleman is seen for a six-month followup office visit. As a history of known ischemic heart disease. On 09/06/08 he underwent successful stenting of his mid LAD and proximal LAD by Dr. Swaziland. These were drug-eluting stents. Patient has not been expressing any recurrent angina pectoris.  Since last visit he has had some problems with swelling of his right calf after a large dog ran into his leg about 6 weeks ago.  He is not having any pain in the leg.  He does have known previous varicose veins in both legs.   Current Outpatient Prescriptions  Medication Sig Dispense Refill  . aspirin 325 MG tablet Take 325 mg by mouth daily.        . clopidogrel (PLAVIX) 75 MG tablet take 1 tablet by mouth once daily  30 tablet  5  . losartan (COZAAR) 100 MG tablet take 1 tablet by mouth once daily  30 tablet  6  . meclizine (ANTIVERT) 25 MG tablet Take 25 mg every 6 hours if needed  30 tablet  3  . metoprolol succinate (TOPROL-XL) 25 MG 24 hr tablet TAKE 1/2 TABLET DAILY  30 tablet  6  . Multiple Vitamin (MULTIVITAMIN) tablet Take 1 tablet by mouth daily.        . nitroGLYCERIN (NITROSTAT) 0.4 MG SL tablet Place 0.4 mg under the tongue every 5 (five) minutes as needed.        Marland Kitchen PARoxetine (PAXIL) 40 MG tablet take 1 tablet by mouth once daily  30 tablet  6  . rosuvastatin (CRESTOR) 20 MG tablet Take 1 tablet (20 mg total) by mouth at bedtime.  90 tablet  1  . furosemide (LASIX) 20 MG tablet Take 1 tablet (20 mg total) by mouth daily as needed.  30 tablet  0    No Known Allergies  Patient Active Problem List  Diagnosis  . Panic attacks  . Arthritis of knee  . Exogenous obesity  . History of sleep apnea  . Depression with anxiety  . Ischemic heart disease  . Hypercholesterolemia  . Malaise and fatigue  . Vertigo     History  Smoking status  . Current Every Day Smoker -- 0.5 packs/day  . Types: Cigarettes  Smokeless tobacco  . Not on file    History  Alcohol Use     Family History  Problem Relation Age of Onset  . Heart disease Father     had cabg  . Cancer Father 76    esophageal  . Cancer Brother     lung cancer    Review of Systems: The patient denies any heat or cold intolerance.  No weight gain or weight loss.  The patient denies headaches or blurry vision.  There is no cough or sputum production.  The patient denies dizziness.  There is no hematuria or hematochezia.  The patient denies any muscle aches or arthritis.  The patient denies any rash.  The patient denies frequent falling or instability.  There is no history of depression or anxiety.  All other systems were reviewed and are negative.   Physical Exam: Filed Vitals:   07/15/12 1031  BP: 132/72  Pulse: 61   general appearance reveals a well-developed well-nourished gentleman in no distress.The head and neck exam reveals pupils equal and reactive.  Extraocular movements are  full.  There is no scleral icterus.  The mouth and pharynx are normal.  The neck is supple.  The carotids reveal no bruits.  The jugular venous pressure is normal.  The  thyroid is not enlarged.  There is no lymphadenopathy.  The chest is clear to percussion and auscultation.  There are no rales or rhonchi.  Expansion of the chest is symmetrical.  The precordium is quiet.  The first heart sound is normal.  The second heart sound is physiologically split.  There is no murmur gallop rub or click.  There is no abnormal lift or heave.  The abdomen is soft and nontender.  The bowel sounds are normal.  The liver and spleen are not enlarged.  There are no abdominal masses.  There are no abdominal bruits.  Extremities reveal good pedal pulses.  There is swelling of his right calf.  There is no tenderness. There is no cyanosis or clubbing.  Strength is normal and  symmetrical in all extremities.  There is no lateralizing weakness.  There are no sensory deficits.  The skin is warm and dry.  There is no rash.  EKG today shows normal sinus rhythm and is within normal limits    Assessment / Plan: We are adding furosemide 20 mg one daily when necessary for fluid retention.  He will try to keep his right leg elevated when possible.  Recheck in one week for fasting lipid panel hepatic function panel and basal metabolic panel.  Recheck in 6 months for Fu followup office visit and fasting lab work.

## 2012-07-15 NOTE — Patient Instructions (Addendum)
Will have you come back next week for fasting labs Tuesday 07/22/12  Elevate leg as much as possible to help with the swelling  Rx sent over for Lasix (furosemide) 20 mg daily as needed for fluid, all other medications remain the same  Your physician wants you to follow-up in: 6 months with fasting labs (lp/bmet/hfp) You will receive a reminder letter in the mail two months in advance. If you don't receive a letter, please call our office to schedule the follow-up appointment.

## 2012-07-15 NOTE — Assessment & Plan Note (Signed)
The patient did not come in fasting today.  He will return in a week for fasting lab work.  He remains on Crestor 20 mg daily.  Is not having any side effects.  His weight is up 2 pounds since last visit.  This could be from the edema in the right leg however.

## 2012-07-15 NOTE — Assessment & Plan Note (Signed)
The patient has not had any recurrent chest pain or angina. 

## 2012-07-22 ENCOUNTER — Telehealth: Payer: Self-pay | Admitting: *Deleted

## 2012-07-22 ENCOUNTER — Other Ambulatory Visit (INDEPENDENT_AMBULATORY_CARE_PROVIDER_SITE_OTHER): Payer: 59

## 2012-07-22 DIAGNOSIS — R42 Dizziness and giddiness: Secondary | ICD-10-CM

## 2012-07-22 DIAGNOSIS — R5383 Other fatigue: Secondary | ICD-10-CM

## 2012-07-22 DIAGNOSIS — E7800 Pure hypercholesterolemia, unspecified: Secondary | ICD-10-CM

## 2012-07-22 DIAGNOSIS — R5381 Other malaise: Secondary | ICD-10-CM

## 2012-07-22 DIAGNOSIS — I2510 Atherosclerotic heart disease of native coronary artery without angina pectoris: Secondary | ICD-10-CM

## 2012-07-22 LAB — CBC WITH DIFFERENTIAL/PLATELET
Basophils Absolute: 0 10*3/uL (ref 0.0–0.1)
Eosinophils Absolute: 0.2 10*3/uL (ref 0.0–0.7)
HCT: 46.1 % (ref 39.0–52.0)
Lymphs Abs: 1.8 10*3/uL (ref 0.7–4.0)
MCHC: 33.2 g/dL (ref 30.0–36.0)
MCV: 97.8 fl (ref 78.0–100.0)
Monocytes Absolute: 0.4 10*3/uL (ref 0.1–1.0)
Neutrophils Relative %: 59 % (ref 43.0–77.0)
Platelets: 165 10*3/uL (ref 150.0–400.0)
RDW: 13.3 % (ref 11.5–14.6)

## 2012-07-22 LAB — BASIC METABOLIC PANEL
CO2: 28 mEq/L (ref 19–32)
Calcium: 9.1 mg/dL (ref 8.4–10.5)
Creatinine, Ser: 0.9 mg/dL (ref 0.4–1.5)
GFR: 95.89 mL/min (ref >60.00)
Sodium: 137 mEq/L (ref 135–145)

## 2012-07-22 LAB — LIPID PANEL
HDL: 29.2 mg/dL — ABNORMAL LOW (ref >39.00)
Total CHOL/HDL Ratio: 5
Triglycerides: 152 mg/dL — ABNORMAL HIGH (ref 0.0–149.0)
VLDL: 30.4 mg/dL (ref 0.0–40.0)

## 2012-07-22 LAB — TSH: TSH: 1.37 u[IU]/mL (ref 0.35–5.50)

## 2012-07-22 LAB — HEPATIC FUNCTION PANEL
Bilirubin, Direct: 0 mg/dL (ref 0.0–0.3)
Total Bilirubin: 0.4 mg/dL (ref 0.3–1.2)
Total Protein: 7.5 g/dL (ref 6.0–8.3)

## 2012-07-22 NOTE — Telephone Encounter (Signed)
Advised patient of lab results  

## 2012-07-22 NOTE — Telephone Encounter (Signed)
Message copied by Burnell Blanks on Tue Jul 22, 2012  4:50 PM ------      Message from: Cassell Clement      Created: Tue Jul 22, 2012  2:19 PM       Please report to patient.  The recent labs are stable. Continue same medication and careful diet.  Hemoglobin is good.  White count normal.  Thyroid function is normal.  The blood sugar is higher at 106.  Watch sweets and carbs.  The triglycerides are also higher.  LDL 74 is satisfactory

## 2012-07-22 NOTE — Telephone Encounter (Signed)
Message copied by Burnell Blanks on Tue Jul 22, 2012  4:49 PM ------      Message from: Cassell Clement      Created: Tue Jul 22, 2012  2:19 PM       Please report to patient.  The recent labs are stable. Continue same medication and careful diet.  Hemoglobin is good.  White count normal.  Thyroid function is normal.  The blood sugar is higher at 106.  Watch sweets and carbs.  The triglycerides are also higher.  LDL 74 is satisfactory

## 2012-07-22 NOTE — Progress Notes (Signed)
Quick Note:  Please report to patient. The recent labs are stable. Continue same medication and careful diet. Hemoglobin is good. White count normal. Thyroid function is normal. The blood sugar is higher at 106. Watch sweets and carbs. The triglycerides are also higher. LDL 74 is satisfactory ______

## 2012-07-28 ENCOUNTER — Other Ambulatory Visit: Payer: Self-pay | Admitting: Cardiology

## 2012-07-28 DIAGNOSIS — E785 Hyperlipidemia, unspecified: Secondary | ICD-10-CM

## 2012-07-28 MED ORDER — ROSUVASTATIN CALCIUM 20 MG PO TABS: 20.0000 mg | ORAL_TABLET | Freq: Every day | ORAL | Status: DC

## 2012-08-21 ENCOUNTER — Other Ambulatory Visit: Payer: Self-pay

## 2012-08-21 MED ORDER — LOSARTAN POTASSIUM 100 MG PO TABS: 100.0000 mg | ORAL_TABLET | Freq: Every day | ORAL | Status: DC

## 2012-10-14 ENCOUNTER — Other Ambulatory Visit: Payer: Self-pay | Admitting: Cardiology

## 2012-10-14 ENCOUNTER — Telehealth: Payer: Self-pay | Admitting: Cardiology

## 2012-10-14 MED ORDER — CLOPIDOGREL BISULFATE 75 MG PO TABS: 75.0000 mg | ORAL_TABLET | Freq: Every day | ORAL | Status: DC

## 2012-10-14 NOTE — Telephone Encounter (Signed)
Per refill line 

## 2012-10-14 NOTE — Telephone Encounter (Signed)
New problem    plavix  75 mg   [facility] in [place] .

## 2012-10-15 ENCOUNTER — Other Ambulatory Visit: Payer: Self-pay | Admitting: *Deleted

## 2012-10-15 MED ORDER — CLOPIDOGREL BISULFATE 75 MG PO TABS: 75.0000 mg | ORAL_TABLET | Freq: Every day | ORAL | Status: DC

## 2012-10-16 ENCOUNTER — Other Ambulatory Visit: Payer: Self-pay | Admitting: *Deleted

## 2012-10-16 DIAGNOSIS — F32A Depression, unspecified: Secondary | ICD-10-CM

## 2012-10-16 MED ORDER — PAROXETINE HCL 40 MG PO TABS: 40.0000 mg | ORAL_TABLET | Freq: Every day | ORAL | Status: DC

## 2012-12-10 ENCOUNTER — Other Ambulatory Visit: Payer: Self-pay | Admitting: *Deleted

## 2012-12-10 MED ORDER — METOPROLOL SUCCINATE ER 25 MG PO TB24: 12.5000 mg | ORAL_TABLET | Freq: Every day | ORAL | Status: DC

## 2013-01-20 ENCOUNTER — Other Ambulatory Visit (INDEPENDENT_AMBULATORY_CARE_PROVIDER_SITE_OTHER): Payer: 59

## 2013-01-20 ENCOUNTER — Encounter: Payer: Self-pay | Admitting: Cardiology

## 2013-01-20 ENCOUNTER — Ambulatory Visit (INDEPENDENT_AMBULATORY_CARE_PROVIDER_SITE_OTHER): Payer: 59 | Admitting: Cardiology

## 2013-01-20 VITALS — BP 124/76 | HR 62 | Ht 73.0 in | Wt 331.8 lb

## 2013-01-20 DIAGNOSIS — I2510 Atherosclerotic heart disease of native coronary artery without angina pectoris: Secondary | ICD-10-CM

## 2013-01-20 DIAGNOSIS — E7800 Pure hypercholesterolemia, unspecified: Secondary | ICD-10-CM

## 2013-01-20 DIAGNOSIS — F410 Panic disorder [episodic paroxysmal anxiety] without agoraphobia: Secondary | ICD-10-CM

## 2013-01-20 DIAGNOSIS — R5381 Other malaise: Secondary | ICD-10-CM

## 2013-01-20 DIAGNOSIS — R42 Dizziness and giddiness: Secondary | ICD-10-CM

## 2013-01-20 DIAGNOSIS — I259 Chronic ischemic heart disease, unspecified: Secondary | ICD-10-CM

## 2013-01-20 DIAGNOSIS — I119 Hypertensive heart disease without heart failure: Secondary | ICD-10-CM

## 2013-01-20 DIAGNOSIS — R5383 Other fatigue: Secondary | ICD-10-CM

## 2013-01-20 LAB — BASIC METABOLIC PANEL
CO2: 25 mEq/L (ref 19–32)
Calcium: 9.1 mg/dL (ref 8.4–10.5)
Creatinine, Ser: 0.8 mg/dL (ref 0.4–1.5)
GFR: 110 mL/min (ref >60.00)
Sodium: 139 mEq/L (ref 135–145)

## 2013-01-20 LAB — HEPATIC FUNCTION PANEL
ALT: 22 U/L (ref 0–53)
AST: 22 U/L (ref 0–37)
Albumin: 4 g/dL (ref 3.5–5.2)
Alkaline Phosphatase: 48 U/L (ref 39–117)
Total Protein: 6.9 g/dL (ref 6.0–8.3)

## 2013-01-20 LAB — LIPID PANEL
Cholesterol: 122 mg/dL (ref 0–200)
HDL: 27.9 mg/dL — ABNORMAL LOW (ref >39.00)
Total CHOL/HDL Ratio: 4
Triglycerides: 137 mg/dL (ref 0.0–149.0)

## 2013-01-20 MED ORDER — MECLIZINE HCL 25 MG PO TABS: 25.0000 mg | ORAL_TABLET | Freq: Two times a day (BID) | ORAL | Status: DC

## 2013-01-20 NOTE — Patient Instructions (Signed)
Will obtain labs today and call you with the results (lp/bmet/hfp)  Your physician recommends that you continue on your current medications as directed. Please refer to the Current Medication list given to you today.  Your physician wants you to follow-up in: 6 months with fasting labs (lp/bmet/hfp)  You will receive a reminder letter in the mail two months in advance. If you don't receive a letter, please call our office to schedule the follow-up appointment.  

## 2013-01-20 NOTE — Assessment & Plan Note (Signed)
The patient has not had any recurrent angina pectoris. 

## 2013-01-20 NOTE — Assessment & Plan Note (Signed)
The patient has a history of hypercholesterolemia.  We are checking lab work today.  He is on a more heart healthy diet now and his weight is down 7 pounds since last visit.

## 2013-01-20 NOTE — Progress Notes (Signed)
[name] Date of Birth:  1955/11/30 [facility] [address] [place], [place]  [address] [phone]  Fax   [phone]  HPI: This pleasant 57 year old gentleman is seen for a six-month followup office visit.  The patient has a history of known ischemic heart disease. On 09/06/08 he underwent successful stenting of his mid LAD and proximal LAD by Dr. Swaziland. These were drug-eluting stents. Patient has not been expressing any recurrent angina pectoris. Since last visit he has had some problems with swelling of his right calf after a large dog ran into his leg about 6 months ago. He is not having any pain in the leg. He does have known previous varicose veins in both legs.  He has occasional mild episodes of vertigo relieved by meclizine.  Current Outpatient Prescriptions  Medication Sig Dispense Refill  . aspirin 325 MG tablet Take 325 mg by mouth daily.        . clopidogrel (PLAVIX) 75 MG tablet Take 1 tablet (75 mg total) by mouth daily.  30 tablet  5  . furosemide (LASIX) 20 MG tablet Take 1 tablet (20 mg total) by mouth daily as needed.  30 tablet  0  . losartan (COZAAR) 100 MG tablet Take 1 tablet (100 mg total) by mouth daily.  30 tablet  6  . meclizine (ANTIVERT) 25 MG tablet Take 1 tablet (25 mg total) by mouth 2 (two) times daily.  60 tablet  5  . metoprolol succinate (TOPROL-XL) 25 MG 24 hr tablet Take 0.5 tablets (12.5 mg total) by mouth daily.  30 tablet  3  . Multiple Vitamin (MULTIVITAMIN) tablet Take 1 tablet by mouth daily.        . nitroGLYCERIN (NITROSTAT) 0.4 MG SL tablet Place 0.4 mg under the tongue every 5 (five) minutes as needed.        Marland Kitchen PARoxetine (PAXIL) 40 MG tablet Take 1 tablet (40 mg total) by mouth daily.  30 tablet  11  . rosuvastatin (CRESTOR) 20 MG tablet Take 1 tablet (20 mg total) by mouth at bedtime.  90 tablet  3   No current facility-administered medications for this visit.    No Known Allergies  Patient Active  Problem List   Diagnosis Date Noted  . Malaise and fatigue 01/03/2012  . Vertigo 01/03/2012  . Ischemic heart disease 06/25/2011  . Hypercholesterolemia 06/25/2011  . Panic attacks   . Arthritis of knee   . Exogenous obesity   . History of sleep apnea   . Depression with anxiety     History  Smoking status  . Current Every Day Smoker -- 0.50 packs/day  . Types: Cigarettes  Smokeless tobacco  . Not on file    History  Alcohol Use     Family History  Problem Relation Age of Onset  . Heart disease Father     had cabg  . Cancer Father 48    esophageal  . Cancer Brother     lung cancer    Review of Systems: The patient denies any heat or cold intolerance.  No weight gain or weight loss.  The patient denies headaches or blurry vision.  There is no cough or sputum production.  The patient denies dizziness.  There is no hematuria or hematochezia.  The patient denies any muscle aches or arthritis.  The patient denies any rash.  The patient denies frequent falling or instability.  There is no history of depression or anxiety.  All other systems  were reviewed and are negative.   Physical Exam: Filed Vitals:   01/20/13 0826  BP: 124/76  Pulse: 62   The general appearance reveals a well-developed well-nourished gentleman in no distress.The head and neck exam reveals pupils equal and reactive.  Extraocular movements are full.  There is no scleral icterus.  The mouth and pharynx are normal.  The neck is supple.  The carotids reveal no bruits.  The jugular venous pressure is normal.  The  thyroid is not enlarged.  There is no lymphadenopathy.  The chest is clear to percussion and auscultation.  There are no rales or rhonchi.  Expansion of the chest is symmetrical.  The precordium is quiet.  The first heart sound is normal.  The second heart sound is physiologically split.  There is no murmur gallop rub or click.  There is no abnormal lift or heave.  The abdomen is soft and nontender.   The bowel sounds are normal.  The liver and spleen are not enlarged.  There are no abdominal masses.  There are no abdominal bruits.  Extremities reveal good pedal pulses.  There is no phlebitis or edema.  The patient has bilateral varicose veins no phlebitis.  There is no cyanosis or clubbing.  Strength is normal and symmetrical in all extremities.  There is no lateralizing weakness.  There are no sensory deficits.  The skin is warm and dry.  There is no rash.     Assessment / Plan: Lab work today is pending.  Continue present meds. Recheck in 6 months for office visit EKG and fasting lab work.

## 2013-01-20 NOTE — Assessment & Plan Note (Signed)
The patient has had less problems with his nerves and panic attacks and has been cutting back on his use of Paxil

## 2013-01-20 NOTE — Progress Notes (Signed)
Quick Note:  Please report to patient. The recent labs are stable. Continue same medication and careful diet. ______ 

## 2013-01-21 ENCOUNTER — Telehealth: Payer: Self-pay | Admitting: *Deleted

## 2013-01-21 NOTE — Telephone Encounter (Signed)
Message copied by Burnell Blanks on Wed Jan 21, 2013  9:05 AM ------      Message from: Cassell Clement      Created: Tue Jan 20, 2013  8:43 PM       Please report to patient.  The recent labs are stable. Continue same medication and careful diet. ------

## 2013-01-21 NOTE — Telephone Encounter (Signed)
Mailed copy of labs and left message to call if any questions  

## 2013-02-03 ENCOUNTER — Telehealth: Payer: Self-pay | Admitting: Cardiology

## 2013-02-03 DIAGNOSIS — J329 Chronic sinusitis, unspecified: Secondary | ICD-10-CM

## 2013-02-03 MED ORDER — AZITHROMYCIN 250 MG PO TABS: ORAL_TABLET | ORAL | Status: DC

## 2013-02-03 NOTE — Telephone Encounter (Signed)
Advised patient

## 2013-02-03 NOTE — Telephone Encounter (Signed)
Pressure in head congestion, sneezing, mostly clear congestion. Has been going on for a couple of weeks and not getting any better. Denies fever. Would like antibiotic called to [facility]. Will forward to  Dr. Patty Sermons for review

## 2013-02-03 NOTE — Telephone Encounter (Signed)
New Problem  Pt wants to know if he can get an antibiotic called in for a sinus infection.  He said he has dry cough.

## 2013-02-03 NOTE — Telephone Encounter (Signed)
Z-Pak and Mucinex 

## 2013-02-25 ENCOUNTER — Encounter: Payer: Self-pay | Admitting: Physician Assistant

## 2013-02-25 ENCOUNTER — Ambulatory Visit (INDEPENDENT_AMBULATORY_CARE_PROVIDER_SITE_OTHER): Payer: 59 | Admitting: Physician Assistant

## 2013-02-25 VITALS — BP 110/78 | HR 62 | Temp 98.3°F | Resp 18 | Ht 73.0 in | Wt 331.0 lb

## 2013-02-25 DIAGNOSIS — F410 Panic disorder [episodic paroxysmal anxiety] without agoraphobia: Secondary | ICD-10-CM

## 2013-02-25 DIAGNOSIS — F418 Other specified anxiety disorders: Secondary | ICD-10-CM

## 2013-02-25 DIAGNOSIS — E7800 Pure hypercholesterolemia, unspecified: Secondary | ICD-10-CM

## 2013-02-25 DIAGNOSIS — F341 Dysthymic disorder: Secondary | ICD-10-CM

## 2013-02-25 DIAGNOSIS — Z87898 Personal history of other specified conditions: Secondary | ICD-10-CM

## 2013-02-25 DIAGNOSIS — I259 Chronic ischemic heart disease, unspecified: Secondary | ICD-10-CM

## 2013-02-25 DIAGNOSIS — E6609 Other obesity due to excess calories: Secondary | ICD-10-CM

## 2013-02-25 DIAGNOSIS — Z8669 Personal history of other diseases of the nervous system and sense organs: Secondary | ICD-10-CM

## 2013-02-25 DIAGNOSIS — F17200 Nicotine dependence, unspecified, uncomplicated: Secondary | ICD-10-CM

## 2013-02-25 DIAGNOSIS — Z23 Encounter for immunization: Secondary | ICD-10-CM

## 2013-02-25 DIAGNOSIS — E669 Obesity, unspecified: Secondary | ICD-10-CM

## 2013-02-25 DIAGNOSIS — M1710 Unilateral primary osteoarthritis, unspecified knee: Secondary | ICD-10-CM

## 2013-02-25 DIAGNOSIS — Z0000 Encounter for general adult medical examination without abnormal findings: Secondary | ICD-10-CM

## 2013-02-25 LAB — CBC WITH DIFFERENTIAL/PLATELET
Eosinophils Absolute: 0.2 10*3/uL (ref 0.0–0.7)
Hemoglobin: 15.3 g/dL (ref 13.0–17.0)
Lymphs Abs: 1.9 10*3/uL (ref 0.7–4.0)
Monocytes Relative: 7 % (ref 3–12)
Neutro Abs: 3 10*3/uL (ref 1.7–7.7)
Neutrophils Relative %: 54 % (ref 43–77)
Platelets: 166 10*3/uL (ref 150–400)
RBC: 4.78 MIL/uL (ref 4.22–5.81)
WBC: 5.5 10*3/uL (ref 4.0–10.5)

## 2013-02-25 NOTE — Progress Notes (Signed)
Patient ID: [name] MRN: [id number], DOB: 1955-11-14 57 y.o. Date of Encounter: 02/25/2013, 9:49 AM    Chief Complaint: Physical (CPE)  HPI: 57 y.o. y/o white male here for CPE and to establish Primary Care here.   He saw Dr. Melrose Nakayama in the past "until he went to that system where you have to pay a bunch of money upfront." LOV there was 07/2008.  He sees Dr. Patty Sermons (Caridology) routinely so he has refilled all his meds etc so far but he has recommended that he needs to establish with a PCP.  He has no complaints today.  Panic/Anxiety: Says he was started on Paxil in 1994 b/c of panic and anxiety. (not depression). Says he was on small dose initially and over time, dose has slowly been increased. Now on 40mg  and this works   well. His mood is stable with no panic or anxiety.  CAD     Per Dr Patty Sermons HTN     Per Dr. Patty Sermons HLD      Per Dr Patty Sermons Obesity; He is well aware of need for diet, exercise, weight loss Smoking: He is well aware of need to stop smoking and risk this has to his health. He refuses med for this and says he has cut back a whole lot. Smoked 2-3 ppd in past. Now 1/2 to 1 ppd.  Review of Systems: Consitutional: No fever, chills, fatigue, night sweats, lymphadenopathy, or weight changes. Eyes: No visual changes, eye redness, or discharge. ENT/Mouth: Ears: No otalgia, tinnitus, hearing loss, discharge. Nose: No congestion, rhinorrhea, sinus pain, or epistaxis. Throat: No sore throat, post nasal drip, or teeth pain. Cardiovascular: No CP, palpitations, diaphoresis, DOE, edema, orthopnea, PND. Respiratory: No cough, hemoptysis, SOB, or wheezing. Gastrointestinal: No anorexia, dysphagia, reflux, pain, nausea, vomiting, hematemesis, diarrhea, constipation, BRBPR, or melena. Genitourinary: No dysuria, frequency, urgency, hematuria, incontinence, nocturia, decreased urinary stream, discharge, impotence, or testicular pain/masses. Musculoskeletal: No decreased  ROM, myalgias, stiffness, joint swelling, or weakness. Skin: No rash, erythema, lesion changes, pain, warmth, jaundice, or pruritis. Neurological: No headache, dizziness, syncope, seizures, tremors, memory loss, coordination problems, or paresthesias. Psychological: No anxiety, depression, hallucinations, SI/HI. Endocrine: No fatigue, polydipsia, polyphagia, polyuria, or known diabetes. All other systems were reviewed and are otherwise negative.  Past Medical History  Diagnosis Date  . Coronary artery disease   . Hyperlipidemia   . Panic attacks     hx of  . Exogenous obesity   . History of sleep apnea   . Depression with anxiety     hx of  . Hypertension   . Arthritis of knee     seeing Dr. Farris Has  . Smoker unmotivated to quit      Past Surgical History  Procedure Laterality Date  . Cardiac catheterization  09/06/2008    stent to mid LAD and proximal LAD  . Polyps of lung  2008    removed by [facility]    Home Meds:  Current Outpatient Prescriptions on File Prior to Visit  Medication Sig Dispense Refill  . aspirin 325 MG tablet Take 325 mg by mouth daily.        Marland Kitchen azithromycin (ZITHROMAX) 250 MG tablet 2 tablets today and then daily until finished  6 each  0  . clopidogrel (PLAVIX) 75 MG tablet Take 1 tablet (75 mg total) by mouth daily.  30 tablet  5  . furosemide (LASIX) 20 MG tablet Take 1 tablet (20 mg total) by mouth daily as needed.  30  tablet  0  . losartan (COZAAR) 100 MG tablet Take 1 tablet (100 mg total) by mouth daily.  30 tablet  6  . meclizine (ANTIVERT) 25 MG tablet Take 1 tablet (25 mg total) by mouth 2 (two) times daily.  60 tablet  5  . metoprolol succinate (TOPROL-XL) 25 MG 24 hr tablet Take 0.5 tablets (12.5 mg total) by mouth daily.  30 tablet  3  . Multiple Vitamin (MULTIVITAMIN) tablet Take 1 tablet by mouth daily.        . nitroGLYCERIN (NITROSTAT) 0.4 MG SL tablet Place 0.4 mg under the tongue every 5 (five) minutes as needed.        Marland Kitchen  PARoxetine (PAXIL) 40 MG tablet Take 1 tablet (40 mg total) by mouth daily.  30 tablet  11  . rosuvastatin (CRESTOR) 20 MG tablet Take 1 tablet (20 mg total) by mouth at bedtime.  90 tablet  3   No current facility-administered medications on file prior to visit.    Allergies: No Known Allergies  History   Social History  . Marital Status: Married    Spouse Name: N/A    Number of Children: N/A  . Years of Education: N/A   Occupational History  . [employer] Work    Social History Main Topics  . Smoking status: Current Every Day Smoker -- 0.50 packs/day    Types: Cigarettes  . Smokeless tobacco: Not on file  . Alcohol Use: Not on file  . Drug Use: Not on file  . Sexually Active: Not on file   Other Topics Concern  . 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    Family History  Problem Relation Age of Onset  . Heart disease Father     had cabg  . Cancer Father 52    esophageal  . Cancer Brother     lung cancer    Physical Exam: Blood pressure 110/78, pulse 62, temperature 98.3 F (36.8 C), temperature source Oral, resp. rate 18, height 6\' 1"  (1.854 m), weight 331 lb (150.141 kg).  General: Obese WM. Appears in no acute distress. HEENT: Normocephalic, atraumatic. Conjunctiva pink, sclera non-icteric. Pupils 2 mm constricting to 1 mm, round, regular, and equally reactive to light and accomodation. EOMI. Internal auditory canal clear. TMs with good cone of light and without pathology. Nasal mucosa pink. Nares are without discharge. No sinus tenderness. Oral mucosa pink.  Pharynx without exudate.   Neck: Supple. Trachea midline. No thyromegaly. Full ROM. No lymphadenopathy. No carotid bruits. Lungs: Clear to auscultation bilaterally without wheezes, rales, or rhonchi. Breathing is of normal effort and unlabored. Cardiovascular: RRR with S1 S2. No murmurs, rubs, or gallops. Distal pulses 2+ symmetrically. No carotid or abdominal  bruits. Abdomen: Soft, non-tender, non-distended with normoactive bowel sounds. No hepatosplenomegaly or masses. No rebound/guarding. No CVA tenderness. No hernias. Rectal: No external hemorrhoids or fissures. Rectal vault without masses. Prostate gland firm and smooth. No nodularity, tenderness, mass, or induration.  Musculoskeletal: Full range of motion and 5/5 strength throughout. His legs are obese. He has varicose veins. There is trace edema. Skin: Warm and moist without erythema, ecchymosis, wounds, or rash. Neuro: A+Ox3. CN II-XII grossly intact. Moves all extremities spontaneously. Full sensation throughout. Normal gait. DTR 2+ throughout upper and lower extremities. Finger to nose intact. Psych:  Responds to questions appropriately with a normal affect.   Assessment/Plan:  57 y.o. y/o  male here for CPE -  1. Visit for preventive health examination A. Screening Labs:  Had CMET, FLP 5/14 by Dr. Patty Sermons. Will not repeat these now. Also, not fasting now. - CBC with Differential - TSH - Vitamin D 25 hydroxy - PSA  B. Screening For Prostate Cancer; Prostate Exam nml. Check PSA.  C. Colorectal Cancer Screening:  He has never had. He is agreeable to have this. I will refer.  - Ambulatory referral to Gastroenterology  D. Immunizations:   Tetanus: Overdue per pt. Give now.   2. Panic attacks Controlled with Paxil . 3. Anxiety Controlled with current dose of Paxil  4. Ischemic heart disease Per Dr. Patty Sermons.  H/O stents x 3 -- 08/2008  5. Hypercholesterolemia Per Cardiology.   6. Smoker unmotivated to quit See HPI. He is well aware of risk that smoking places to his cardiovascular disease as well as cncer and othe rsystemic effects. He refuses medication to healp with this. Will cont to decrese on his own.  7. Exogenous obesity Does no exercise. Obviously is not compliant with diet. Again, he is well aware of effects of this-he has known CAD, HTN, HLD.   8. History of  sleep apnea  9. Arthritis of knee Managed by Dr. Farris Has.   Signed:   [address] [place], [place]  02/25/2013 9:49 AM

## 2013-02-26 LAB — VITAMIN D 25 HYDROXY (VIT D DEFICIENCY, FRACTURES): Vit D, 25-Hydroxy: 37 ng/mL (ref 30–89)

## 2013-03-24 ENCOUNTER — Telehealth: Payer: Self-pay

## 2013-03-24 ENCOUNTER — Other Ambulatory Visit: Payer: Self-pay

## 2013-03-24 DIAGNOSIS — Z1211 Encounter for screening for malignant neoplasm of colon: Secondary | ICD-10-CM

## 2013-03-25 MED ORDER — PEG-KCL-NACL-NASULF-NA ASC-C 100 G PO SOLR: 1.0000 | ORAL | Status: DC

## 2013-03-25 NOTE — Telephone Encounter (Signed)
MOVI PREP SPLIT DOSING- CLEAR LIQUIDS WITH BREAKFAST.  

## 2013-03-25 NOTE — Telephone Encounter (Signed)
Gastroenterology Pre-Procedure Review  Request Date:03/24/2013 Requesting Physician: [facility]. PA-C  PATIENT REVIEW QUESTIONS: The patient responded to the following health history questions as indicated:    1. Diabetes Melitis: no 2. Joint replacements in the past 12 months: no 3. Major health problems in the past 3 months: no 4. Has an artificial valve or MVP: no 5. Has a defibrillator: no 6. Has been advised in past to take antibiotics in advance of a procedure like teeth cleaning: no    MEDICATIONS & ALLERGIES:    Patient reports the following regarding taking any blood thinners:   Plavix? YES Aspirin? YES Coumadin? no  Patient confirms/reports the following medications:  Current Outpatient Prescriptions  Medication Sig Dispense Refill  . aspirin 325 MG tablet Take 325 mg by mouth daily.        . clopidogrel (PLAVIX) 75 MG tablet Take 1 tablet (75 mg total) by mouth daily.  30 tablet  5  . furosemide (LASIX) 20 MG tablet Take 1 tablet (20 mg total) by mouth daily as needed.  30 tablet  0  . losartan (COZAAR) 100 MG tablet Take 1 tablet (100 mg total) by mouth daily.  30 tablet  6  . meclizine (ANTIVERT) 25 MG tablet Take 1 tablet (25 mg total) by mouth 2 (two) times daily.  60 tablet  5  . metoprolol succinate (TOPROL-XL) 25 MG 24 hr tablet Take 0.5 tablets (12.5 mg total) by mouth daily.  30 tablet  3  . PARoxetine (PAXIL) 40 MG tablet Take 1 tablet (40 mg total) by mouth daily.  30 tablet  11  . rosuvastatin (CRESTOR) 20 MG tablet Take 1 tablet (20 mg total) by mouth at bedtime.  90 tablet  3  . Multiple Vitamin (MULTIVITAMIN) tablet Take 1 tablet by mouth daily.        . nitroGLYCERIN (NITROSTAT) 0.4 MG SL tablet Place 0.4 mg under the tongue every 5 (five) minutes as needed.         No current facility-administered medications for this visit.    Patient confirms/reports the following allergies:  No Known Allergies  No orders of the defined types were placed in  this encounter.    AUTHORIZATION INFORMATION Primary Insurance:   ID #:   Group #:  Pre-Cert / Auth required: Pre-Cert / Auth #:   Secondary Insurance:   ID #:   Group #:  Pre-Cert / Auth required:  Pre-Cert / Auth #:   SCHEDULE INFORMATION: Procedure has been scheduled as follows:  Date:  05/01/2013       Time:  8:30 AM Location: [facility] Short Stay  This Gastroenterology Pre-Precedure Review Form is being routed to the following provider(s): Dr. Darrick Penna

## 2013-03-25 NOTE — Telephone Encounter (Signed)
Rx sent to the pharmacy and instructions mailed to pt.  

## 2013-03-30 ENCOUNTER — Other Ambulatory Visit: Payer: Self-pay | Admitting: *Deleted

## 2013-03-30 MED ORDER — LOSARTAN POTASSIUM 100 MG PO TABS: 100.0000 mg | ORAL_TABLET | Freq: Every day | ORAL | Status: DC

## 2013-04-06 ENCOUNTER — Telehealth: Payer: Self-pay

## 2013-04-06 NOTE — Telephone Encounter (Signed)
I called [employer] and was told that the pat is part of [facility] Coverage. Called that number and was told that it is not correct place to call. Called pt and LMOM to call with number on the back of his insurance card for me to check on PA for TCS.

## 2013-04-07 ENCOUNTER — Telehealth: Payer: Self-pay

## 2013-04-07 NOTE — Telephone Encounter (Signed)
I called [employer] at [phone] and spoke to Kynthia W.. PA is not required for screening colonoscopy.

## 2013-04-07 NOTE — Telephone Encounter (Signed)
Pt called back. See separate phone note for the PA info.

## 2013-04-09 ENCOUNTER — Other Ambulatory Visit: Payer: Self-pay | Admitting: *Deleted

## 2013-04-21 ENCOUNTER — Encounter (HOSPITAL_COMMUNITY): Payer: Self-pay | Admitting: Pharmacy Technician

## 2013-04-22 ENCOUNTER — Telehealth: Payer: Self-pay

## 2013-04-22 NOTE — Telephone Encounter (Signed)
I called pt to update triage prior to colonoscopy scheduled for 05/01/2013. He said he has not had any new medical problems and no change in his medications.

## 2013-04-22 NOTE — Telephone Encounter (Signed)
REVIEWED.  

## 2013-05-01 ENCOUNTER — Encounter (HOSPITAL_COMMUNITY): Payer: Self-pay | Admitting: *Deleted

## 2013-05-01 ENCOUNTER — Encounter (HOSPITAL_COMMUNITY)
Admission: RE | Disposition: A | Discharge status: Home / Self Care | Payer: Self-pay | Source: Ambulatory Visit | Attending: Gastroenterology

## 2013-05-01 ENCOUNTER — Ambulatory Visit (HOSPITAL_COMMUNITY)
Admission: RE | Admit: 2013-05-01 | Discharge: 2013-05-01 | Disposition: A | Discharge status: Home / Self Care | Payer: 59 | Source: Ambulatory Visit | Attending: Gastroenterology | Admitting: Gastroenterology

## 2013-05-01 DIAGNOSIS — K5730 Diverticulosis of large intestine without perforation or abscess without bleeding: Secondary | ICD-10-CM | POA: Insufficient documentation

## 2013-05-01 DIAGNOSIS — K648 Other hemorrhoids: Secondary | ICD-10-CM | POA: Insufficient documentation

## 2013-05-01 DIAGNOSIS — Z1211 Encounter for screening for malignant neoplasm of colon: Secondary | ICD-10-CM

## 2013-05-01 DIAGNOSIS — I10 Essential (primary) hypertension: Secondary | ICD-10-CM | POA: Insufficient documentation

## 2013-05-01 HISTORY — PX: COLONOSCOPY: SHX5424

## 2013-05-01 SURGERY — COLONOSCOPY
Anesthesia: Moderate Sedation

## 2013-05-01 MED ORDER — MIDAZOLAM HCL 5 MG/5ML IJ SOLN
INTRAMUSCULAR | Status: AC
  Filled 2013-05-01: qty 10

## 2013-05-01 MED ORDER — SODIUM CHLORIDE 0.9 % IV SOLN: INTRAVENOUS | Status: DC

## 2013-05-01 MED ORDER — STERILE WATER FOR IRRIGATION IR SOLN
Status: DC | PRN
  Administered 2013-05-01: 09:00:00

## 2013-05-01 MED ORDER — MEPERIDINE HCL 100 MG/ML IJ SOLN
INTRAMUSCULAR | Status: AC
  Filled 2013-05-01: qty 2

## 2013-05-01 MED ORDER — MIDAZOLAM HCL 5 MG/5ML IJ SOLN
INTRAMUSCULAR | Status: DC | PRN
  Administered 2013-05-01 (×3): 2 mg via INTRAVENOUS

## 2013-05-01 MED ORDER — MEPERIDINE HCL 100 MG/ML IJ SOLN
INTRAMUSCULAR | Status: DC | PRN
  Administered 2013-05-01 (×2): 25 mg via INTRAVENOUS
  Administered 2013-05-01: 50 mg via INTRAVENOUS

## 2013-05-01 NOTE — Op Note (Signed)
[facility] [address] [place], [address]   COLONOSCOPY PROCEDURE REPORT  PATIENT: [name], [name]  MR#: [id number] BIRTHDATE: [birth year] , 56  yrs. old GENDER: Male ENDOSCOPIST: Jonette Eva, MD REFERRED XB:JYNW Dionicio Stall, PA-C PROCEDURE DATE:  05/01/2013 PROCEDURE:   Colonoscopy, screening INDICATIONS:Average risk patient for colon cancer. MEDICATIONS: Demerol 100 mg IV and Versed 6 mg IV  DESCRIPTION OF PROCEDURE:    Physical exam was performed.  Informed consent was obtained from the patient after explaining the benefits, risks, and alternatives to procedure.  The patient was connected to monitor and placed in left lateral position. Continuous oxygen was provided by nasal cannula and IV medicine administered through an indwelling cannula.  After administration of sedation and rectal exam, the patients rectum was intubated and the EC-3890Li ([id number])  colonoscope was advanced under direct visualization to the ileum.  The scope was removed slowly by carefully examining the color, texture, anatomy, and integrity mucosa on the way out.  The patient was recovered in endoscopy and discharged home in satisfactory condition.       COLON FINDINGS: The mucosa appeared normal in the terminal ileum.  , Mild diverticulosis was noted in the sigmoid colon.  , and Moderate sized internal hemorrhoids were found.  PREP QUALITY: good. CECAL W/D TIME: 15 minutes  COMPLICATIONS: None  ENDOSCOPIC IMPRESSION: 1.   Normal mucosa in the terminal ileum 2.   Mild diverticulosis was noted in the sigmoid colon 3.   Moderate sized internal hemorrhoids   RECOMMENDATIONS: HIGH FIBER DIET TCS IN 10 YEARS       _______________________________ eSignedJonette Eva, MD 05/01/2013 5:14 PM

## 2013-05-01 NOTE — H&P (Signed)
Primary Care Physician:  Frazier Richards, PA-C Primary Gastroenterologist:  Dr. Darrick Penna  Pre-Procedure History & Physical: HPI:  [name] is a 57 y.o. male here for COLON CANCER SCREENING.  Past Medical History  Diagnosis Date  . Coronary artery disease   . Hyperlipidemia   . Panic attacks     hx of  . Exogenous obesity   . History of sleep apnea   . Depression with anxiety     hx of  . Hypertension   . Arthritis of knee     seeing Dr. Farris Has  . Smoker unmotivated to quit     Past Surgical History  Procedure Laterality Date  . Cardiac catheterization  09/06/2008    stent to mid LAD and proximal LAD  . Polyps of lung  2008    removed by [facility]    Prior to Admission medications   Medication Sig Start Date End Date Taking? Authorizing Provider  aspirin 325 MG tablet Take 325 mg by mouth daily.     Yes Historical Provider, MD  clopidogrel (PLAVIX) 75 MG tablet Take 1 tablet (75 mg total) by mouth daily. 10/15/12  Yes Cassell Clement, MD  losartan (COZAAR) 100 MG tablet Take 1 tablet (100 mg total) by mouth daily. 03/30/13  Yes Cassell Clement, MD  metoprolol succinate (TOPROL-XL) 25 MG 24 hr tablet Take 0.5 tablets (12.5 mg total) by mouth daily. 12/10/12  Yes Cassell Clement, MD  PARoxetine (PAXIL) 40 MG tablet Take 1 tablet (40 mg total) by mouth daily. 10/16/12  Yes Cassell Clement, MD  peg 3350 powder (MOVIPREP) 100 G SOLR Take 1 kit (200 g total) by mouth as directed. 03/25/13  Yes West Bali, MD  rosuvastatin (CRESTOR) 20 MG tablet Take 1 tablet (20 mg total) by mouth at bedtime. 07/28/12 08/30/13 Yes Cassell Clement, MD  furosemide (LASIX) 20 MG tablet Take 1 tablet (20 mg total) by mouth daily as needed. 07/15/12   Cassell Clement, MD  meclizine (ANTIVERT) 25 MG tablet Take 1 tablet (25 mg total) by mouth 2 (two) times daily. 01/20/13   Cassell Clement, MD  nitroGLYCERIN (NITROSTAT) 0.4 MG SL tablet Place 0.4 mg under the tongue every 5 (five) minutes  as needed for chest pain.     Historical Provider, MD    Allergies as of 03/24/2013  . (No Known Allergies)    Family History  Problem Relation Age of Onset  . Heart disease Father     had cabg  . Cancer Father 51    esophageal  . Cancer Brother     lung cancer  . Colon cancer Neg Hx     History   Social History  . Marital Status: Married    Spouse Name: N/A    Number of Children: N/A  . Years of Education: N/A   Occupational History  . [employer] Work    Social History Main Topics  . Smoking status: Current Every Day Smoker -- 1.00 packs/day for 40 years    Types: Cigarettes  . Smokeless tobacco: Not on file  . Alcohol Use: Yes     Comment: occasionally  . Drug Use: No  . Sexual Activity: Not on file   Other Topics Concern  . 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    Review of Systems: See HPI, otherwise negative ROS   Physical Exam: BP 135/70  Pulse 20  Temp(Src) 98.4 F (36.9  C) (Oral)  Resp 20  Ht 6\' 1"  (1.854 m)  Wt 330 lb (149.687 kg)  BMI 43.55 kg/m2  SpO2 94% General:   Alert,  pleasant and cooperative in NAD Head:  Normocephalic and atraumatic. Neck:  Supple; Lungs:  Clear throughout to auscultation.    Heart:  Regular rate and rhythm. Abdomen:  Soft, nontender and nondistended. Normal bowel sounds, without guarding, and without rebound.   Neurologic:  Alert and  oriented x4;  grossly normal neurologically.  Impression/Plan:     SCREENING  Plan:  1. TCS TODAY

## 2013-05-04 ENCOUNTER — Encounter (HOSPITAL_COMMUNITY): Payer: Self-pay | Admitting: Gastroenterology

## 2013-07-21 ENCOUNTER — Telehealth: Payer: Self-pay | Admitting: *Deleted

## 2013-07-21 ENCOUNTER — Other Ambulatory Visit (INDEPENDENT_AMBULATORY_CARE_PROVIDER_SITE_OTHER): Payer: Commercial Indemnity

## 2013-07-21 ENCOUNTER — Encounter: Payer: Self-pay | Admitting: Cardiology

## 2013-07-21 ENCOUNTER — Ambulatory Visit (INDEPENDENT_AMBULATORY_CARE_PROVIDER_SITE_OTHER): Payer: Commercial Indemnity | Admitting: Cardiology

## 2013-07-21 VITALS — BP 144/78 | HR 62 | Ht 73.0 in | Wt 333.4 lb

## 2013-07-21 DIAGNOSIS — I119 Hypertensive heart disease without heart failure: Secondary | ICD-10-CM

## 2013-07-21 DIAGNOSIS — E7800 Pure hypercholesterolemia, unspecified: Secondary | ICD-10-CM

## 2013-07-21 DIAGNOSIS — I2510 Atherosclerotic heart disease of native coronary artery without angina pectoris: Secondary | ICD-10-CM

## 2013-07-21 DIAGNOSIS — I259 Chronic ischemic heart disease, unspecified: Secondary | ICD-10-CM

## 2013-07-21 DIAGNOSIS — R42 Dizziness and giddiness: Secondary | ICD-10-CM

## 2013-07-21 LAB — BASIC METABOLIC PANEL
BUN: 12 mg/dL (ref 6–23)
Creatinine, Ser: 0.8 mg/dL (ref 0.4–1.5)
GFR: 111.45 mL/min (ref >60.00)
Potassium: 4.3 mEq/L (ref 3.5–5.1)

## 2013-07-21 LAB — HEPATIC FUNCTION PANEL
ALT: 24 U/L (ref 0–53)
Albumin: 4.1 g/dL (ref 3.5–5.2)
Alkaline Phosphatase: 50 U/L (ref 39–117)
Bilirubin, Direct: 0.1 mg/dL (ref 0.0–0.3)
Total Protein: 7.1 g/dL (ref 6.0–8.3)

## 2013-07-21 LAB — LIPID PANEL
Cholesterol: 120 mg/dL (ref 0–200)
LDL Cholesterol: 72 mg/dL (ref 0–99)
Triglycerides: 98 mg/dL (ref 0.0–149.0)
VLDL: 19.6 mg/dL (ref 0.0–40.0)

## 2013-07-21 NOTE — Progress Notes (Signed)
Quick Note:  Please report to patient. The recent labs are stable. Continue same medication and careful diet. ______ 

## 2013-07-21 NOTE — Assessment & Plan Note (Signed)
The weight is up 2 pounds since last visit.  We're checking fasting labs today results pending.  Continue same meds and work harder on weight reduction and caloric restriction

## 2013-07-21 NOTE — Assessment & Plan Note (Signed)
The patient has not had recent episodes of vertigo.

## 2013-07-21 NOTE — Progress Notes (Signed)
[name] Date of Birth:  [birth year] [address] [place], [place]  [address] [phone]  Fax   [phone]  HPI: This pleasant 57 year old gentleman is seen for a six-month followup office visit.  The patient has a history of known ischemic heart disease. On 09/06/08 he underwent successful stenting of his mid LAD and proximal LAD by Dr. [place]. These were drug-eluting stents. Patient has not been expressing any recurrent angina pectoris.  He does have known previous varicose veins in both legs.  He has occasional mild episodes of vertigo relieved by meclizine.  Since last visit he's had no new cardiac symptoms.  He has had a difficult time losing weight. Current Outpatient Prescriptions  Medication Sig Dispense Refill  . aspirin 325 MG tablet Take 325 mg by mouth daily.        . clopidogrel (PLAVIX) 75 MG tablet Take 1 tablet (75 mg total) by mouth daily.  30 tablet  5  . furosemide (LASIX) 20 MG tablet Take 1 tablet (20 mg total) by mouth daily as needed.  30 tablet  0  . losartan (COZAAR) 100 MG tablet Take 1 tablet (100 mg total) by mouth daily.  30 tablet  6  . meclizine (ANTIVERT) 25 MG tablet Take 1 tablet (25 mg total) by mouth 2 (two) times daily.  60 tablet  5  . metoprolol succinate (TOPROL-XL) 25 MG 24 hr tablet Take 0.5 tablets (12.5 mg total) by mouth daily.  30 tablet  3  . nitroGLYCERIN (NITROSTAT) 0.4 MG SL tablet Place 0.4 mg under the tongue every 5 (five) minutes as needed for chest pain.       Marland Kitchen PARoxetine (PAXIL) 40 MG tablet Take 1 tablet (40 mg total) by mouth daily.  30 tablet  11  . rosuvastatin (CRESTOR) 20 MG tablet Take 1 tablet (20 mg total) by mouth at bedtime.  90 tablet  3   No current facility-administered medications for this visit.    No Known Allergies  Patient Active Problem List   Diagnosis Date Noted  . Smoker unmotivated to quit   . Malaise and fatigue 01/03/2012  . Vertigo 01/03/2012  . Ischemic heart disease  06/25/2011  . Hypercholesterolemia 06/25/2011  . Panic attacks   . Arthritis of knee   . Exogenous obesity   . History of sleep apnea   . Depression with anxiety     History  Smoking status  . Current Every Day Smoker -- 1.00 packs/day for 40 years  . Types: Cigarettes  Smokeless tobacco  . Not on file    History  Alcohol Use  . Yes    Comment: occasionally    Family History  Problem Relation Age of Onset  . Heart disease Father     had cabg  . Cancer Father 19    esophageal  . Cancer Brother     lung cancer  . Colon cancer Neg Hx     Review of Systems: The patient denies any heat or cold intolerance.  No weight gain or weight loss.  The patient denies headaches or blurry vision.  There is no cough or sputum production.  The patient denies dizziness.  There is no hematuria or hematochezia.  The patient denies any muscle aches or arthritis.  The patient denies any rash.  The patient denies frequent falling or instability.  There is no history of depression or anxiety.  All other systems were reviewed and are negative.   Physical  Exam: Filed Vitals:   07/21/13 0958  BP: 144/78  Pulse: 62   The general appearance reveals a well-developed well-nourished gentleman in no distress.The head and neck exam reveals pupils equal and reactive.  Extraocular movements are full.  There is no scleral icterus.  The mouth and pharynx are normal.  The neck is supple.  The carotids reveal no bruits.  The jugular venous pressure is normal.  The  thyroid is not enlarged.  There is no lymphadenopathy.  The chest is clear to percussion and auscultation.  There are no rales or rhonchi.  Expansion of the chest is symmetrical.  The precordium is quiet.  The first heart sound is normal.  The second heart sound is physiologically split.  There is no murmur gallop rub or click.  There is no abnormal lift or heave.  The abdomen is soft and nontender.  The bowel sounds are normal.  The liver and spleen  are not enlarged.  There are no abdominal masses.  There are no abdominal bruits.  Extremities reveal good pedal pulses.  There is no phlebitis or edema.  The patient has bilateral varicose veins no phlebitis.  There is no cyanosis or clubbing.  Strength is normal and symmetrical in all extremities.  There is no lateralizing weakness.  There are no sensory deficits.  The skin is warm and dry.  There is no rash.  EKG shows normal sinus rhythm and is within normal   Assessment / Plan: Lab work today is pending.  Continue present meds.  Work harder on weight loss .  Recheck in 6 months for office visit EKG and fasting lab work.

## 2013-07-21 NOTE — Patient Instructions (Signed)
Your physician recommends that you continue on your current medications as directed. Please refer to the Current Medication list given to you today.  Your physician wants you to follow-up in: 6 months with fasting labs (lp/bmet/hfp)  You will receive a reminder letter in the mail two months in advance. If you don't receive a letter, please call our office to schedule the follow-up appointment.  

## 2013-07-21 NOTE — Assessment & Plan Note (Signed)
The patient has not had any chest pain.  He has not had to use any sublingual nitroglycerin.

## 2013-07-21 NOTE — Telephone Encounter (Signed)
Mailed copy of labs and left message to call if any questions  

## 2013-07-21 NOTE — Telephone Encounter (Signed)
Message copied by Burnell Blanks on Tue Jul 21, 2013  3:50 PM ------      Message from: Cassell Clement      Created: Tue Jul 21, 2013  2:18 PM       Please report to patient.  The recent labs are stable. Continue same medication and careful diet. ------

## 2013-08-11 ENCOUNTER — Other Ambulatory Visit: Payer: Self-pay | Admitting: Cardiology

## 2013-08-19 ENCOUNTER — Other Ambulatory Visit: Payer: Self-pay | Admitting: Cardiology

## 2013-09-09 ENCOUNTER — Other Ambulatory Visit: Payer: Self-pay | Admitting: Cardiology

## 2013-10-29 ENCOUNTER — Other Ambulatory Visit: Payer: Self-pay | Admitting: Cardiology

## 2013-11-02 ENCOUNTER — Other Ambulatory Visit: Payer: Self-pay | Admitting: Cardiology

## 2013-11-26 ENCOUNTER — Other Ambulatory Visit: Payer: Self-pay | Admitting: Cardiology

## 2014-01-01 ENCOUNTER — Telehealth: Payer: Self-pay | Admitting: Cardiology

## 2014-01-01 NOTE — Telephone Encounter (Signed)
Advised to contact PCP per  Dr. Mare Ferrari

## 2014-01-01 NOTE — Telephone Encounter (Signed)
Patient is returning your call. Please call back.  °

## 2014-01-01 NOTE — Telephone Encounter (Signed)
New Message  Pt states that he has been sick with a cold for the past week. He is requesting the nurse to call in an antibiotic.

## 2014-01-01 NOTE — Telephone Encounter (Signed)
Left message to call back  

## 2014-01-04 ENCOUNTER — Ambulatory Visit (INDEPENDENT_AMBULATORY_CARE_PROVIDER_SITE_OTHER): Payer: Commercial Indemnity | Admitting: Physician Assistant

## 2014-01-04 ENCOUNTER — Encounter: Payer: Self-pay | Admitting: Physician Assistant

## 2014-01-04 VITALS — BP 116/60 | HR 68 | Temp 98.6°F | Resp 18 | Wt 322.0 lb

## 2014-01-04 DIAGNOSIS — B9689 Other specified bacterial agents as the cause of diseases classified elsewhere: Principal | ICD-10-CM

## 2014-01-04 DIAGNOSIS — J988 Other specified respiratory disorders: Secondary | ICD-10-CM

## 2014-01-04 DIAGNOSIS — A499 Bacterial infection, unspecified: Secondary | ICD-10-CM

## 2014-01-04 DIAGNOSIS — F17200 Nicotine dependence, unspecified, uncomplicated: Secondary | ICD-10-CM

## 2014-01-04 MED ORDER — AZITHROMYCIN 250 MG PO TABS: ORAL_TABLET | ORAL | Status: DC

## 2014-01-04 NOTE — Progress Notes (Signed)
Patient ID: [name] MRN: [id number], DOB: 23-Feb-1956, 58 y.o. Date of Encounter: 01/04/2014, 10:19 AM    Chief Complaint:  Chief Complaint  Patient presents with  . chest congestion/cough    sneezing/sinus headache     HPI: 58 y.o. year old male reports the symptoms started 5 days ago. Says that initially that first night he woke up during the night sweating. On next day he " just felt terrible."   since then he has had a bad cough productive of thick dark phlegm. Also has what he calls a "sinus headache."  Says when he blows his nose that he's also getting thick dark mucus from his nose. He does smoke. He has been using Mucinex DM.     Home Meds: See attached medication section for any medications that were entered at today's visit. The computer does not put those onto this list.The following list is a list of meds entered prior to today's visit.   Current Outpatient Prescriptions on File Prior to Visit  Medication Sig Dispense Refill  . aspirin 325 MG tablet Take 325 mg by mouth daily.        . clopidogrel (PLAVIX) 75 MG tablet take 1 tablet by mouth once daily  30 tablet  2  . CRESTOR 20 MG tablet take 1 tablet by mouth at bedtime  90 tablet  1  . furosemide (LASIX) 20 MG tablet Take 1 tablet (20 mg total) by mouth daily as needed.  30 tablet  0  . losartan (COZAAR) 100 MG tablet take 1 tablet once daily  30 tablet  2  . meclizine (ANTIVERT) 25 MG tablet Take 1 tablet (25 mg total) by mouth 2 (two) times daily.  60 tablet  5  . metoprolol succinate (TOPROL-XL) 25 MG 24 hr tablet take 1/2 tablet once daily  30 tablet  2  . nitroGLYCERIN (NITROSTAT) 0.4 MG SL tablet Place 0.4 mg under the tongue every 5 (five) minutes as needed for chest pain.       Marland Kitchen PARoxetine (PAXIL) 40 MG tablet take 1 tablet by mouth once daily  30 tablet  5   No current facility-administered medications on file prior to visit.    Allergies: No Known Allergies    Review of Systems: See HPI for  pertinent ROS. All other ROS negative.    Physical Exam: Blood pressure 116/60, pulse 68, temperature 98.6 F (37 C), temperature source Oral, resp. rate 18, weight 322 lb (146.058 kg)., Body mass index is 42.49 kg/(m^2). General:  Obese WM. Appears in no acute distress. HEENT: Normocephalic, atraumatic, eyes without discharge, sclera non-icteric, nares are without discharge. Bilateral auditory canals clear, TM's are without perforation, pearly grey and translucent with reflective cone of light bilaterally. Oral cavity moist, posterior pharynx without exudate, erythema, peritonsillar abscess. No tenderness with  percussion of bilateral maxillary sinuses or frontal sinuses.  Neck: Supple. No thyromegaly. No lymphadenopathy. Lungs: Clear bilaterally to auscultation without wheezes, rales, or rhonchi. Breathing is unlabored. Lungs are clear throughout. I hear no wheezes. Heart: Regular rhythm. No murmurs, rubs, or gallops. Msk:  Strength and tone normal for age. Extremities/Skin: Warm and dry. Neuro: Alert and oriented X 3. Moves all extremities spontaneously. Gait is normal. CNII-XII grossly in tact. Psych:  Responds to questions appropriately with a normal affect.     ASSESSMENT AND PLAN:  58 y.o. year old male with  1. Bacterial respiratory infection - azithromycin (ZITHROMAX) 250 MG tablet; Day 1: Take 2 daily.  Days 2-5: Take 1 daily.  Dispense: 6 tablet; Refill: 0  2. Smoker unmotivated to quit  F/U if symptoms worsen significantly or if symptoms do not resolve within one week after completion of antibiotic.  Signed, [address] [place], [place], [facility] 01/04/2014 10:19 AM

## 2014-02-01 ENCOUNTER — Encounter: Payer: Self-pay | Admitting: Cardiology

## 2014-02-01 ENCOUNTER — Other Ambulatory Visit: Payer: Self-pay | Admitting: Cardiology

## 2014-02-01 ENCOUNTER — Ambulatory Visit (INDEPENDENT_AMBULATORY_CARE_PROVIDER_SITE_OTHER): Payer: Commercial Indemnity | Admitting: Cardiology

## 2014-02-01 ENCOUNTER — Other Ambulatory Visit: Payer: Commercial Indemnity

## 2014-02-01 VITALS — BP 131/72 | HR 58 | Ht 73.0 in | Wt 317.0 lb

## 2014-02-01 DIAGNOSIS — E669 Obesity, unspecified: Secondary | ICD-10-CM

## 2014-02-01 DIAGNOSIS — E7800 Pure hypercholesterolemia, unspecified: Secondary | ICD-10-CM

## 2014-02-01 DIAGNOSIS — I259 Chronic ischemic heart disease, unspecified: Secondary | ICD-10-CM

## 2014-02-01 DIAGNOSIS — E6609 Other obesity due to excess calories: Secondary | ICD-10-CM

## 2014-02-01 DIAGNOSIS — I2510 Atherosclerotic heart disease of native coronary artery without angina pectoris: Secondary | ICD-10-CM

## 2014-02-01 DIAGNOSIS — I119 Hypertensive heart disease without heart failure: Secondary | ICD-10-CM

## 2014-02-01 LAB — HEPATIC FUNCTION PANEL
ALBUMIN: 4.1 g/dL (ref 3.5–5.2)
ALT: 20 U/L (ref 0–53)
AST: 18 U/L (ref 0–37)
Alkaline Phosphatase: 53 U/L (ref 39–117)
Bilirubin, Direct: 0.1 mg/dL (ref 0.0–0.3)
Total Bilirubin: 0.6 mg/dL (ref 0.2–1.2)
Total Protein: 6.5 g/dL (ref 6.0–8.3)

## 2014-02-01 LAB — LIPID PANEL
CHOL/HDL RATIO: 4
Cholesterol: 126 mg/dL (ref 0–200)
HDL: 31.4 mg/dL — AB (ref >39.00)
LDL CALC: 80 mg/dL (ref 0–99)
NONHDL: 94.6
Triglycerides: 73 mg/dL (ref 0.0–149.0)
VLDL: 14.6 mg/dL (ref 0.0–40.0)

## 2014-02-01 LAB — BASIC METABOLIC PANEL
BUN: 16 mg/dL (ref 6–23)
CHLORIDE: 104 meq/L (ref 96–112)
CO2: 31 meq/L (ref 19–32)
Calcium: 9.2 mg/dL (ref 8.4–10.5)
Creatinine, Ser: 0.7 mg/dL (ref 0.4–1.5)
GFR: 118.29 mL/min (ref >60.00)
Glucose, Bld: 94 mg/dL (ref 70–99)
POTASSIUM: 4.3 meq/L (ref 3.5–5.1)
SODIUM: 139 meq/L (ref 135–145)

## 2014-02-01 NOTE — Progress Notes (Signed)
Quick Note:  Please report to patient. The recent labs are stable. Continue same medication and careful diet. ______ 

## 2014-02-01 NOTE — Assessment & Plan Note (Signed)
The patient is on a careful diet.  He hopes to get down below 300 pounds.  Since last visit 6 months ago he has lost 16 pounds.

## 2014-02-01 NOTE — Assessment & Plan Note (Signed)
Exercise tolerance is normal.  Is not having any chest pain or shortness of breath.

## 2014-02-01 NOTE — Progress Notes (Signed)
[name] Date of Birth:  10/28/1955 [place], [place]  [address] [phone]  Fax   [phone]  HPI: This pleasant 58 year old gentleman is seen for a six-month followup office visit.  He has a history of known ischemic heart disease.  On 09/06/08 he underwent successful stenting of his mid LAD and proximal LAD by Dr. [name] with drug-eluting stents.  He has done well since that time.  He remains on dual antiplatelet therapy.  He has not been having any bleeding problems.  Current Outpatient Prescriptions  Medication Sig Dispense Refill  . aspirin 325 MG tablet Take 325 mg by mouth daily.        Marland Kitchen azithromycin (ZITHROMAX) 250 MG tablet Day 1: Take 2 daily. Days 2-5: Take 1 daily.  6 tablet  0  . clopidogrel (PLAVIX) 75 MG tablet take 1 tablet by mouth once daily  30 tablet  2  . CRESTOR 20 MG tablet take 1 tablet by mouth at bedtime  90 tablet  1  . furosemide (LASIX) 20 MG tablet Take 1 tablet (20 mg total) by mouth daily as needed.  30 tablet  0  . losartan (COZAAR) 100 MG tablet take 1 tablet once daily  30 tablet  2  . meclizine (ANTIVERT) 25 MG tablet Take 1 tablet (25 mg total) by mouth 2 (two) times daily.  60 tablet  5  . metoprolol succinate (TOPROL-XL) 25 MG 24 hr tablet take 1/2 tablet once daily  30 tablet  2  . nitroGLYCERIN (NITROSTAT) 0.4 MG SL tablet Place 0.4 mg under the tongue every 5 (five) minutes as needed for chest pain.       Marland Kitchen PARoxetine (PAXIL) 40 MG tablet take 1 tablet by mouth once daily  30 tablet  5   No current facility-administered medications for this visit.    No Known Allergies  Patient Active Problem List   Diagnosis Date Noted  . Smoker unmotivated to quit   . Malaise and fatigue 01/03/2012  . Vertigo 01/03/2012  . Ischemic heart disease 06/25/2011  . Hypercholesterolemia 06/25/2011  . Panic attacks   . Arthritis of knee   . Exogenous obesity   . History of sleep apnea   .  Depression with anxiety     History  Smoking status  . Current Every Day Smoker -- 1.00 packs/day for 40 years  . Types: Cigarettes  Smokeless tobacco  . Never Used    History  Alcohol Use  . Yes    Comment: occasionally    Family History  Problem Relation Age of Onset  . Heart disease Father     had cabg  . Cancer Father 48    esophageal  . Cancer Brother     lung cancer  . Colon cancer Neg Hx     Review of Systems: The patient denies any heat or cold intolerance.  No weight gain or weight loss.  The patient denies headaches or blurry vision.  There is no cough or sputum production.  The patient denies dizziness.  There is no hematuria or hematochezia.  The patient denies any muscle aches or arthritis.  The patient denies any rash.  The patient denies frequent falling or instability.  There is no history of depression or anxiety.  All other systems were reviewed and are negative.   Physical Exam: Filed Vitals:   02/01/14 0916  BP: 131/72  Pulse: 58   the general appearance  reveals a large gentleman in no distress.The head and neck exam reveals pupils equal and reactive.  Extraocular movements are full.  There is no scleral icterus.  The mouth and pharynx are normal.  The neck is supple.  The carotids reveal no bruits.  The jugular venous pressure is normal.  The  thyroid is not enlarged.  There is no lymphadenopathy.  The chest is clear to percussion and auscultation.  There are no rales or rhonchi.  Expansion of the chest is symmetrical.  The precordium is quiet.  The first heart sound is normal.  The second heart sound is physiologically split.  There is no murmur gallop rub or click.  There is no abnormal lift or heave.  The abdomen is soft and nontender.  The bowel sounds are normal.  The liver and spleen are not enlarged.  There are no abdominal masses.  There are no abdominal bruits.  Extremities reveal good pedal pulses.  There is no phlebitis or edema.  There is no  cyanosis or clubbing.  Strength is normal and symmetrical in all extremities.  There is no lateralizing weakness.  There are no sensory deficits.  The skin is warm and dry.  There is no rash.  EKG today shows that his bradycardia and is otherwise within normal limits   Assessment / Plan: 1. ischemic heart disease status post drug-eluting stent to mid LAD and proximal LAD on 09/06/08 for crescendo angina pectoris. 2. Hypercholesterolemia 3. exogenous obesity  Plan: Continue on same medication.  Recheck in 6 months for followup office visit and fasting lab work.

## 2014-02-01 NOTE — Patient Instructions (Signed)
Will obtain labs today and call you with the results (LP/BMET/HFP)  Your physician recommends that you continue on your current medications as directed. Please refer to the Current Medication list given to you today.  Your physician wants you to follow-up in: 6 [facility] You will receive a reminder letter in the mail two months in advance. If you don't receive a letter, please call our office to schedule the follow-up appointment.

## 2014-02-01 NOTE — Assessment & Plan Note (Signed)
Patient has a history of hypercholesterolemia and is on Crestor.  He is not having any myalgias.  Blood work is pending

## 2014-02-04 ENCOUNTER — Telehealth: Payer: Self-pay | Admitting: *Deleted

## 2014-02-04 MED ORDER — NITROGLYCERIN 0.4 MG SL SUBL: 0.4000 mg | SUBLINGUAL_TABLET | SUBLINGUAL | Status: DC | PRN

## 2014-02-04 MED ORDER — FUROSEMIDE 20 MG PO TABS: 20.0000 mg | ORAL_TABLET | Freq: Every day | ORAL | Status: DC | PRN

## 2014-02-04 NOTE — Telephone Encounter (Signed)
Message copied by Earvin Hansen on Thu Feb 04, 2014  9:27 AM ------      Message from: Darlin Coco      Created: Mon Feb 01, 2014  8:46 PM       Please report to patient.  The recent labs are stable. Continue same medication and careful diet. ------

## 2014-02-04 NOTE — Telephone Encounter (Signed)
Advised patient of lab results  

## 2014-02-15 ENCOUNTER — Other Ambulatory Visit: Payer: Self-pay | Admitting: Cardiology

## 2014-02-24 ENCOUNTER — Other Ambulatory Visit: Payer: Self-pay | Admitting: Cardiology

## 2014-04-01 ENCOUNTER — Other Ambulatory Visit: Payer: Self-pay | Admitting: Cardiology

## 2014-05-24 ENCOUNTER — Other Ambulatory Visit: Payer: Self-pay | Admitting: Cardiology

## 2014-08-23 ENCOUNTER — Other Ambulatory Visit: Payer: Self-pay | Admitting: Cardiology

## 2014-08-30 ENCOUNTER — Other Ambulatory Visit: Payer: Self-pay | Admitting: Cardiology

## 2014-09-01 ENCOUNTER — Encounter: Payer: Self-pay | Admitting: Cardiology

## 2014-09-01 ENCOUNTER — Ambulatory Visit (INDEPENDENT_AMBULATORY_CARE_PROVIDER_SITE_OTHER): Payer: Commercial Indemnity | Admitting: Cardiology

## 2014-09-01 VITALS — BP 124/76 | HR 65 | Ht 73.0 in | Wt 328.0 lb

## 2014-09-01 DIAGNOSIS — E7800 Pure hypercholesterolemia, unspecified: Secondary | ICD-10-CM

## 2014-09-01 DIAGNOSIS — I259 Chronic ischemic heart disease, unspecified: Secondary | ICD-10-CM

## 2014-09-01 DIAGNOSIS — I119 Hypertensive heart disease without heart failure: Secondary | ICD-10-CM

## 2014-09-01 LAB — LIPID PANEL
CHOL/HDL RATIO: 5
Cholesterol: 153 mg/dL (ref 0–200)
HDL: 28.1 mg/dL — ABNORMAL LOW (ref >39.00)
LDL CALC: 91 mg/dL (ref 0–99)
NonHDL: 124.9
Triglycerides: 168 mg/dL — ABNORMAL HIGH (ref 0.0–149.0)
VLDL: 33.6 mg/dL (ref 0.0–40.0)

## 2014-09-01 LAB — BASIC METABOLIC PANEL
BUN: 12 mg/dL (ref 6–23)
CO2: 27 mEq/L (ref 19–32)
CREATININE: 0.7 mg/dL (ref 0.4–1.5)
Calcium: 9.2 mg/dL (ref 8.4–10.5)
Chloride: 105 mEq/L (ref 96–112)
GFR: 125.97 mL/min (ref >60.00)
GLUCOSE: 104 mg/dL — AB (ref 70–99)
POTASSIUM: 4.1 meq/L (ref 3.5–5.1)
Sodium: 136 mEq/L (ref 135–145)

## 2014-09-01 LAB — HEPATIC FUNCTION PANEL
ALT: 27 U/L (ref 0–53)
AST: 20 U/L (ref 0–37)
Albumin: 4.4 g/dL (ref 3.5–5.2)
Alkaline Phosphatase: 58 U/L (ref 39–117)
BILIRUBIN TOTAL: 0.8 mg/dL (ref 0.2–1.2)
Bilirubin, Direct: 0 mg/dL (ref 0.0–0.3)
TOTAL PROTEIN: 7.4 g/dL (ref 6.0–8.3)

## 2014-09-01 NOTE — Progress Notes (Signed)
[name] Date of Birth:  1956-04-10 [address] [place] [address] [place], [place]  [address] [phone]  Fax   [phone]  HPI: This pleasant 60 year old gentleman is seen for a six-month followup office visit.  He has a history of known ischemic heart disease.  On 09/06/08 he underwent successful stenting of his mid LAD and proximal LAD by Dr. [name] with drug-eluting stents.  He has done well since that time.  He remains on dual antiplatelet therapy.  He has not been having any bleeding problems. The patient has not been experiencing any exertional chest discomfort.  He had some atypical chest discomfort on New Year's Day which he attributed to overeating.  He has gained 11 pounds since his last visit. Blood pressure has been remaining stable on current medication.  No headaches or dizziness. For his cholesterol the patient takes Crestor 20 mg daily.  He is not having any myalgias.  We are checking lab work today.  Current Outpatient Prescriptions  Medication Sig Dispense Refill  . aspirin 325 MG tablet Take 325 mg by mouth daily.      Marland Kitchen azithromycin (ZITHROMAX) 250 MG tablet Day 1: Take 2 daily. Days 2-5: Take 1 daily. 6 tablet 0  . clopidogrel (PLAVIX) 75 MG tablet take 1 tablet by mouth once daily 30 tablet 3  . CRESTOR 20 MG tablet take 1 tablet by mouth at bedtime 90 tablet 0  . furosemide (LASIX) 20 MG tablet Take 1 tablet (20 mg total) by mouth daily as needed. 30 tablet 5  . losartan (COZAAR) 100 MG tablet take 1 tablet by mouth once daily 30 tablet 3  . meclizine (ANTIVERT) 25 MG tablet Take 1 tablet (25 mg total) by mouth 2 (two) times daily. 60 tablet 5  . metoprolol succinate (TOPROL-XL) 25 MG 24 hr tablet TAKE 1/2 TABLET BY MOUTH ONCE DAILY 30 tablet 3  . nitroGLYCERIN (NITROSTAT) 0.4 MG SL tablet Place 1 tablet (0.4 mg total) under the tongue every 5 (five) minutes as needed for chest pain. 25 tablet prn  . PARoxetine (PAXIL) 40 MG tablet take  1 tablet by mouth once daily 30 tablet 11   No current facility-administered medications for this visit.    No Known Allergies  Patient Active Problem List   Diagnosis Date Noted  . Smoker unmotivated to quit   . Malaise and fatigue 01/03/2012  . Vertigo 01/03/2012  . Ischemic heart disease 06/25/2011  . Hypercholesterolemia 06/25/2011  . Panic attacks   . Arthritis of knee   . Exogenous obesity   . History of sleep apnea   . Depression with anxiety     History  Smoking status  . Current Every Day Smoker -- 1.00 packs/day for 40 years  . Types: Cigarettes  Smokeless tobacco  . Never Used    History  Alcohol Use  . Yes    Comment: occasionally    Family History  Problem Relation Age of Onset  . Heart disease Father     had cabg  . Cancer Father 73    esophageal  . Cancer Brother     lung cancer  . Colon cancer Neg Hx     Review of Systems: The patient denies any heat or cold intolerance.  No weight gain or weight loss.  The patient denies headaches or blurry vision.  There is no cough or sputum production.  The patient denies dizziness.  There is no hematuria or hematochezia.  The patient denies any muscle aches or arthritis.  The patient denies any rash.  The patient denies frequent falling or instability.  There is no history of depression or anxiety.  All other systems were reviewed and are negative.   Physical Exam: Filed Vitals:   09/01/14 0812  BP: 124/76  Pulse: 65   the general appearance reveals a large gentleman in no distress.The head and neck exam reveals pupils equal and reactive.  Extraocular movements are full.  There is no scleral icterus.  The mouth and pharynx are normal.  The neck is supple.  The carotids reveal no bruits.  The jugular venous pressure is normal.  The  thyroid is not enlarged.  There is no lymphadenopathy.  The chest is clear to percussion and auscultation.  There are no rales or rhonchi.  Expansion of the chest is symmetrical.   The precordium is quiet.  The first heart sound is normal.  The second heart sound is physiologically split.  There is no murmur gallop rub or click.  There is no abnormal lift or heave.  The abdomen is soft and nontender.  The bowel sounds are normal.  The liver and spleen are not enlarged.  There are no abdominal masses.  There are no abdominal bruits.  Extremities reveal good pedal pulses.  There is no phlebitis or edema.  There is no cyanosis or clubbing.  Strength is normal and symmetrical in all extremities.  There is no lateralizing weakness.  There are no sensory deficits.  The skin is warm and dry.  There is no rash.     Assessment / Plan: 1. ischemic heart disease status post drug-eluting stent to mid LAD and proximal LAD on 09/06/08 for crescendo angina pectoris. 2. Hypercholesterolemia 3. exogenous obesity.  His weight has gone up 11 pounds since last visit.  Plan: Continue on same medication.  Recheck in 6 months for followup office visit and fasting lab work and EKG.  Work harder on diet and weight loss.  Lab work today is pending.

## 2014-09-01 NOTE — Progress Notes (Signed)
Quick Note:  Please report to patient. The recent labs are stable. Continue same medication and careful diet. The TGs and BS are higher. Work harder on diet and weight loss. ______

## 2014-09-01 NOTE — Patient Instructions (Signed)
Will obtain labs today and call you with the results (lp/bmet/hfp)  Your physician recommends that you continue on your current medications as directed. Please refer to the Current Medication list given to you today.  Your physician wants you to follow-up in: 6 months with fasting labs (lp/bmet/hfp) and ekg  You will receive a reminder letter in the mail two months in advance. If you don't receive a letter, please call our office to schedule the follow-up appointment.   Work harder on diet and weight loss

## 2014-09-02 ENCOUNTER — Other Ambulatory Visit: Payer: Self-pay | Admitting: Cardiology

## 2014-12-02 ENCOUNTER — Other Ambulatory Visit: Payer: Self-pay | Admitting: Cardiology

## 2014-12-02 NOTE — Telephone Encounter (Signed)
Darlin Coco, MD at 09/01/2014 8:19 AM  metoprolol succinate (TOPROL-XL) 25 MG 24 hr tabletTAKE 1/2 TABLET BY MOUTH ONCE DAILY Plan: Continue on same medication. Recheck in 6 months for followup office visit and fasting lab work and EKG. Work harder on diet and weight loss. Lab work today is pending Notes Recorded by Darlin Coco, MD on 09/01/2014 at 8:28 PM Please report to patient. The recent labs are stable. Continue same medication and careful diet. The TGs and BS are higher. Work harder on diet and weight loss.

## 2014-12-27 ENCOUNTER — Other Ambulatory Visit: Payer: Self-pay | Admitting: Cardiology

## 2015-01-05 ENCOUNTER — Other Ambulatory Visit: Payer: Self-pay | Admitting: Cardiology

## 2015-03-03 ENCOUNTER — Other Ambulatory Visit (INDEPENDENT_AMBULATORY_CARE_PROVIDER_SITE_OTHER): Payer: Commercial Indemnity | Admitting: *Deleted

## 2015-03-03 ENCOUNTER — Ambulatory Visit (INDEPENDENT_AMBULATORY_CARE_PROVIDER_SITE_OTHER): Payer: Commercial Indemnity | Admitting: Cardiology

## 2015-03-03 ENCOUNTER — Encounter: Payer: Self-pay | Admitting: Cardiology

## 2015-03-03 VITALS — BP 134/68 | HR 63 | Ht 73.0 in | Wt 329.0 lb

## 2015-03-03 DIAGNOSIS — E7800 Pure hypercholesterolemia, unspecified: Secondary | ICD-10-CM

## 2015-03-03 DIAGNOSIS — I119 Hypertensive heart disease without heart failure: Secondary | ICD-10-CM | POA: Diagnosis not present

## 2015-03-03 DIAGNOSIS — I8393 Asymptomatic varicose veins of bilateral lower extremities: Secondary | ICD-10-CM | POA: Diagnosis not present

## 2015-03-03 DIAGNOSIS — I259 Chronic ischemic heart disease, unspecified: Secondary | ICD-10-CM | POA: Diagnosis not present

## 2015-03-03 LAB — BASIC METABOLIC PANEL
BUN: 11 mg/dL (ref 6–23)
CO2: 26 mEq/L (ref 19–32)
Calcium: 9.3 mg/dL (ref 8.4–10.5)
Chloride: 106 mEq/L (ref 96–112)
Creatinine, Ser: 0.79 mg/dL (ref 0.40–1.50)
GFR: 107.56 mL/min (ref >60.00)
Glucose, Bld: 97 mg/dL (ref 70–99)
POTASSIUM: 3.9 meq/L (ref 3.5–5.1)
Sodium: 140 mEq/L (ref 135–145)

## 2015-03-03 LAB — HEPATIC FUNCTION PANEL
ALT: 20 U/L (ref 0–53)
AST: 16 U/L (ref 0–37)
Albumin: 4 g/dL (ref 3.5–5.2)
Alkaline Phosphatase: 51 U/L (ref 39–117)
BILIRUBIN DIRECT: 0.1 mg/dL (ref 0.0–0.3)
BILIRUBIN TOTAL: 0.3 mg/dL (ref 0.2–1.2)
TOTAL PROTEIN: 6.7 g/dL (ref 6.0–8.3)

## 2015-03-03 LAB — LIPID PANEL
Cholesterol: 138 mg/dL (ref 0–200)
HDL: 26.5 mg/dL — ABNORMAL LOW (ref >39.00)
LDL Cholesterol: 77 mg/dL (ref 0–99)
NonHDL: 111.5
TRIGLYCERIDES: 172 mg/dL — AB (ref 0.0–149.0)
Total CHOL/HDL Ratio: 5
VLDL: 34.4 mg/dL (ref 0.0–40.0)

## 2015-03-03 NOTE — Progress Notes (Signed)
Quick Note:  Please report to patient. The recent labs are stable. Continue same medication and careful diet. ______ 

## 2015-03-03 NOTE — Patient Instructions (Signed)
Medication Instructions:  Your physician recommends that you continue on your current medications as directed. Please refer to the Current Medication list given to you today.  Labwork: none  Testing/Procedures: none  Follow-Up: Your physician wants you to follow-up in: 6 months with fasting labs (lp/bmet/hfp)  You will receive a reminder letter in the mail two months in advance. If you don't receive a letter, please call our office to schedule the follow-up appointment.  Any Other Special Instructions Will Be Listed Below (If Applicable). Work harder on diet and weight loss  [facility] SHOULD BE IN TOUCH WITH YOU SOON ABOUT AN APPOINTMENT WITH A VASCULAR DOCTOR IF YOU DO NOT HEAR FROM THEM BY MIDDLE OF NEXT WEEK CALL THEIR OFFICE AT [phone]

## 2015-03-03 NOTE — Addendum Note (Signed)
Addended by: Eulis Foster on: 03/03/2015 07:57 AM   Modules accepted: Orders

## 2015-03-03 NOTE — Progress Notes (Signed)
Cardiology Office Note   Date:  03/03/2015   ID:  Tamarcus, Condie [birth year], MRN [id number]  PCP:  Karis Juba, PA-C  Cardiologist: Darlin Coco MD  No chief complaint on file.     History of Present Illness: [name] is a 59 y.o. male who presents for a six-month follow-up office visit.  This pleasant 59 year old gentleman is seen for a six-month followup office visit. He has a history of known ischemic heart disease. On 09/06/08 he underwent successful stenting of his mid LAD and proximal LAD by Dr. [name] with drug-eluting stents. He has done well since that time. He remains on dual antiplatelet therapy. He has not been having any bleeding problems. The patient has not been experiencing any exertional chest discomfort. Patient denies any dizzy spells or syncope.  No palpitations. Blood pressure has been remaining stable on current medication. No headaches or dizziness. For his cholesterol the patient takes Crestor 20 mg daily. He is not having any myalgias. We are checking lab work today. The patient is concerned about his chronic varicose veins.  He would like to see someone about these.  We will refer him to [facility]. The patient has osteoarthritis in his knees and has to have periodic cortisone shots from his orthopedist.  Past Medical History  Diagnosis Date  . Coronary artery disease   . Hyperlipidemia   . Panic attacks     hx of  . Exogenous obesity   . History of sleep apnea   . Depression with anxiety     hx of  . Hypertension   . Arthritis of knee     seeing Dr. Alfonso Ramus  . Smoker unmotivated to quit     Past Surgical History  Procedure Laterality Date  . Cardiac catheterization  09/06/2008    stent to mid LAD and proximal LAD  . Polyps of lung  2008    removed by [facility]  . Colonoscopy N/A 05/01/2013    Procedure: COLONOSCOPY;  Surgeon: [name], MD;  [facility];  Service: Endoscopy;  Laterality: N/A;   8:30 AM     Current Outpatient Prescriptions  Medication Sig Dispense Refill  . aspirin 325 MG tablet Take 325 mg by mouth daily.      Marland Kitchen azithromycin (ZITHROMAX) 250 MG tablet Day 1: Take 2 daily. Days 2-5: Take 1 daily. 6 tablet 0  . clopidogrel (PLAVIX) 75 MG tablet take 1 tablet by mouth once daily 30 tablet 3  . CRESTOR 20 MG tablet take 1 tablet by mouth at bedtime 90 tablet 0  . CRESTOR 20 MG tablet take 1 tablet by mouth at bedtime 90 tablet 1  . furosemide (LASIX) 20 MG tablet Take 1 tablet (20 mg total) by mouth daily as needed. 30 tablet 5  . losartan (COZAAR) 100 MG tablet take 1 tablet by mouth once daily 30 tablet 3  . meclizine (ANTIVERT) 25 MG tablet Take 1 tablet (25 mg total) by mouth 2 (two) times daily. 60 tablet 5  . metoprolol succinate (TOPROL-XL) 25 MG 24 hr tablet take 1/2 tablet by mouth once daily 45 tablet 3  . nitroGLYCERIN (NITROSTAT) 0.4 MG SL tablet Place 1 tablet (0.4 mg total) under the tongue every 5 (five) minutes as needed for chest pain. 25 tablet prn  . PARoxetine (PAXIL) 40 MG tablet take 1 tablet by mouth once daily 30 tablet 11   No current facility-administered medications for this visit.  Allergies:   Review of patient's allergies indicates no known allergies.    Social History:  The patient  reports that he has been smoking Cigarettes.  He has a 40 pack-year smoking history. He has never used smokeless tobacco. He reports that he drinks alcohol. He reports that he does not use illicit drugs.   Family History:  The patient's family history includes Cancer in his brother; Cancer (age of onset: 84) in his father; Heart disease in his father. There is no history of Colon cancer.    ROS:  Please see the history of present illness.   Otherwise, review of systems are positive for none.   All other systems are reviewed and negative.    PHYSICAL EXAM: VS:  BP 134/68 mmHg  Pulse 63  Ht '6\' 1"'$  (1.854 m)  Wt 329 lb (149.233 kg)  BMI 43.42 kg/m2 ,  BMI Body mass index is 43.42 kg/(m^2). GEN: Well nourished, well developed, in no acute distress HEENT: normal Neck: no JVD, carotid bruits, or masses Cardiac: RRR; no murmurs, rubs, or gallops,no edema.  There are bilateral varicose veins.  Respiratory:  clear to auscultation bilaterally, normal work of breathing GI: soft, nontender, nondistended, + BS MS: no deformity or atrophy Skin: warm and dry, no rash Neuro:  Strength and sensation are intact Psych: euthymic mood, full affect   EKG:  EKG is ordered today. The ekg ordered today demonstrates sinus bradycardia.  Otherwise within normal limits.   Recent Labs: 09/01/2014: ALT 27; BUN 12; Creatinine, Ser 0.7; Potassium 4.1; Sodium 136    Lipid Panel    Component Value Date/Time   CHOL 153 09/01/2014 0833   TRIG 168.0* 09/01/2014 0833   HDL 28.10* 09/01/2014 0833   CHOLHDL 5 09/01/2014 0833   VLDL 33.6 09/01/2014 0833   LDLCALC 91 09/01/2014 0833      Wt Readings from Last 3 Encounters:  03/03/15 329 lb (149.233 kg)  09/01/14 328 lb (148.78 kg)  02/01/14 317 lb (143.79 kg)       ASSESSMENT AND PLAN:  1. ischemic heart disease status post drug-eluting stent to mid LAD and proximal LAD on 09/06/08 for crescendo angina pectoris. 2. Hypercholesterolemia 3. exogenous obesity. 4.  Bilateral varicose veins   Plan: Continue on same medication. Recheck in 6 months for followup office visit and fasting lab work and EKG. Work harder on diet and weight loss. Lab work today is pending.  Referral to the [facility] regarding varicose vein options.   Current medicines are reviewed at length with the patient today.  The patient does not have concerns regarding medicines.  The following changes have been made:  no change  Labs/ tests ordered today include:   Orders Placed This Encounter  Procedures  . Ambulatory referral to Vascular Surgery  . EKG 12-Lead     Signed, Darlin Coco MD 03/03/2015 8:40 AM    [address], [place], [place]  [address] Phone: [phone]; Fax: [phone]

## 2015-03-09 ENCOUNTER — Other Ambulatory Visit: Payer: Self-pay | Admitting: *Deleted

## 2015-03-09 DIAGNOSIS — I83893 Varicose veins of bilateral lower extremities with other complications: Secondary | ICD-10-CM

## 2015-03-14 ENCOUNTER — Telehealth: Payer: Self-pay

## 2015-03-14 NOTE — Telephone Encounter (Signed)
Prior auth sent to [facility] for Crestor via Cover My Meds.

## 2015-04-05 ENCOUNTER — Telehealth: Payer: Self-pay

## 2015-04-05 NOTE — Telephone Encounter (Signed)
PA for Crestor denied, however pharmacy switched it to Rosuvastatin '20mg'$  and it went through without a problem.

## 2015-04-07 ENCOUNTER — Other Ambulatory Visit: Payer: Self-pay | Admitting: Cardiology

## 2015-04-15 ENCOUNTER — Other Ambulatory Visit: Payer: Self-pay

## 2015-04-15 MED ORDER — ROSUVASTATIN CALCIUM 20 MG PO TABS: 20.0000 mg | ORAL_TABLET | Freq: Every day | ORAL | Status: DC

## 2015-04-26 ENCOUNTER — Encounter: Payer: Self-pay | Admitting: Vascular Surgery

## 2015-04-27 ENCOUNTER — Encounter: Payer: Self-pay | Admitting: Vascular Surgery

## 2015-04-27 ENCOUNTER — Ambulatory Visit (INDEPENDENT_AMBULATORY_CARE_PROVIDER_SITE_OTHER): Payer: Commercial Indemnity | Admitting: Vascular Surgery

## 2015-04-27 ENCOUNTER — Ambulatory Visit (HOSPITAL_COMMUNITY)
Admission: RE | Admit: 2015-04-27 | Discharge: 2015-04-27 | Disposition: A | Discharge status: Home / Self Care | Payer: Commercial Indemnity | Source: Ambulatory Visit | Attending: Vascular Surgery | Admitting: Vascular Surgery

## 2015-04-27 VITALS — BP 139/80 | HR 58 | Temp 97.9°F | Resp 18 | Ht 73.0 in | Wt 326.0 lb

## 2015-04-27 DIAGNOSIS — I83899 Varicose veins of unspecified lower extremities with other complications: Secondary | ICD-10-CM | POA: Insufficient documentation

## 2015-04-27 DIAGNOSIS — I83893 Varicose veins of bilateral lower extremities with other complications: Secondary | ICD-10-CM

## 2015-04-27 DIAGNOSIS — I82811 Embolism and thrombosis of superficial veins of right lower extremities: Secondary | ICD-10-CM | POA: Diagnosis not present

## 2015-04-27 NOTE — Progress Notes (Signed)
Referred by:  [name], PA-C [place], [place] [id number]  Reason for referral: Swollen bilateral legs  History of Present Illness  [name] is a 59 y.o. (1956/02/04) male who presents with chief complaint: bilateral swollen legs.  Patient notes, onset of swelling years ago, associated with no obvious trigger.  The patient's symptoms include: R>L leg swelling which increases as the day progress, aching with distension, and occasionally R calf burning.  The patient has had no history of DVT, known history of varicose vein, no history of venous stasis ulcers, no history of  Lymphedema and no history of skin changes in lower legs.  There is no family history of venous disorders.  The patient has used intermittent compression stockings in the past.   Past Medical History  Diagnosis Date  . Coronary artery disease   . Hyperlipidemia   . Panic attacks     hx of  . Exogenous obesity   . History of sleep apnea   . Depression with anxiety     hx of  . Hypertension   . Arthritis of knee     seeing Dr. Alfonso Ramus  . Smoker unmotivated to quit   . Varicose veins     Past Surgical History  Procedure Laterality Date  . Cardiac catheterization  09/06/2008    stent to mid LAD and proximal LAD  . Polyps of lung  2008    removed by [facility]  . Colonoscopy N/A 05/01/2013    Procedure: COLONOSCOPY;  Surgeon: [name], MD;  Location: [facility];  Service: Endoscopy;  Laterality: N/A;  8:30 AM  . Tonsillectomy      Social History   Social History  . Marital Status: Married    Spouse Name: N/A  . Number of Children: N/A  . Years of Education: N/A   Occupational History  . [employer] Work    Social History Main Topics  . Smoking status: Current Every Day Smoker -- 1.00 packs/day for 40 years    Types: Cigarettes  . Smokeless tobacco: Never Used  . Alcohol Use: 0.0 oz/week    0 Standard drinks or equivalent per week     Comment:  occasionally  . Drug Use: No  . Sexual Activity: Not on file   Other Topics Concern  . 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    Family History  Problem Relation Age of Onset  . Heart disease Father     had cabg  . Cancer Father 46    esophageal  . Cancer Brother     lung cancer  . Colon cancer Neg Hx      Current Outpatient Prescriptions on File Prior to Visit  Medication Sig Dispense Refill  . aspirin 325 MG tablet Take 325 mg by mouth daily.      . clopidogrel (PLAVIX) 75 MG tablet take 1 tablet by mouth once daily 30 tablet 3  . furosemide (LASIX) 20 MG tablet Take 1 tablet (20 mg total) by mouth daily as needed. 30 tablet 5  . losartan (COZAAR) 100 MG tablet take 1 tablet by mouth once daily 30 tablet 3  . metoprolol succinate (TOPROL-XL) 25 MG 24 hr tablet take 1/2 tablet by mouth once daily 45 tablet 3  . nitroGLYCERIN (NITROSTAT) 0.4 MG SL tablet Place 1 tablet (0.4 mg total) under the tongue every 5 (five)  minutes as needed for chest pain. 25 tablet prn  . PARoxetine (PAXIL) 40 MG tablet take 1 tablet by mouth once daily 30 tablet 11  . rosuvastatin (CRESTOR) 20 MG tablet Take 1 tablet (20 mg total) by mouth at bedtime. 90 tablet 3  . azithromycin (ZITHROMAX) 250 MG tablet Day 1: Take 2 daily. Days 2-5: Take 1 daily. (Patient not taking: Reported on 04/27/2015) 6 tablet 0  . meclizine (ANTIVERT) 25 MG tablet Take 1 tablet (25 mg total) by mouth 2 (two) times daily. (Patient not taking: Reported on 04/27/2015) 60 tablet 5   No current facility-administered medications on file prior to visit.    No Known Allergies  REVIEW OF SYSTEMS:  (Positives checked otherwise negative)  CARDIOVASCULAR:  '[]'$  chest pain, '[]'$  chest pressure, '[]'$  palpitations, '[]'$  shortness of breath when laying flat, '[]'$  shortness of breath with exertion,  '[]'$  pain in feet when walking, '[]'$  pain in feet when laying flat, '[]'$  history of blood clot in veins (DVT), '[]'$   history of phlebitis, '[x]'$  swelling in legs, '[x]'$  varicose veins  PULMONARY:  '[]'$  productive cough, '[]'$  asthma, '[]'$  wheezing  NEUROLOGIC:  '[]'$  weakness in arms or legs, '[]'$  numbness in arms or legs, '[]'$  difficulty speaking or slurred speech, '[]'$  temporary loss of vision in one eye, '[]'$  dizziness  HEMATOLOGIC:  '[]'$  bleeding problems, '[]'$  problems with blood clotting too easily  MUSCULOSKEL:  '[]'$  joint pain, '[]'$  joint swelling  GASTROINTEST:  '[]'$  vomiting blood, '[]'$  blood in stool     GENITOURINARY:  '[]'$  burning with urination, '[]'$  blood in urine  PSYCHIATRIC:  '[]'$  history of major depression  INTEGUMENTARY:  '[]'$  rashes, '[]'$  ulcers  CONSTITUTIONAL:  '[]'$  fever, '[]'$  chills   Physical Examination Filed Vitals:   04/27/15 1109  BP: 139/80  Pulse: 58  Temp: 97.9 F (36.6 C)  Resp: 18  Height: '6\' 1"'$  (1.854 m)  Weight: 326 lb (147.873 kg)  SpO2: 95%   Body mass index is 43.02 kg/(m^2).  General: A&O x 3, WD, obese  Head: [place]/AT  Ear/Nose/Throat: Hearing grossly intact, nares w/o erythema or drainage, oropharynx w/o Erythema/Exudate   Eyes: PERRLA, EOMI  Neck: Supple, no nuchal rigidity, no palpable LAD  Pulmonary: Sym exp, good air movt, CTAB, no rales, rhonchi, & wheezing  Cardiac: RRR, Nl S1, S2, no Murmurs, rubs or gallops  Vascular: Vessel Right Left  Radial Palpable Palpable  Brachial Palpable Palpable  Carotid Palpable, without bruit Palpable, without bruit  Aorta Not palpable N/A  Femoral Palpable Palpable  Popliteal Not palpable Not palpable  PT Faintly Palpable Palpable  DP Faintly Palpable Palpable   Gastrointestinal: soft, NTND, -G/R, - HSM, - masses, - CVAT B  Musculoskeletal: M/S 5/5 throughout , Extremities without ischemic changes , R>L protruding varicosities, R calf circumference > L calf, prominent R posterior calf varicosities, R>L LDS  Neurologic: CN 2-12 intact , Pain and light touch intact in extremities , Motor exam as listed above  Psychiatric: Judgment  intact, Mood & affect appropriate for pt's clinical situation  Dermatologic: See M/S exam for extremity exam, no rashes otherwise noted  Lymph : No Cervical, Axillary, or Inguinal lymphadenopathy    Non-Invasive Vascular Imaging  BLE Venous Insufficiency Duplex (Date: 04/27/2015):   RLE: No DVT, chronic SVT R SSV, no SVT in GSV, + GSV reflux, + deep venous reflux: CFV, FV, PV, +SSV reflux  LLE: No DVT and SVT, + GSV reflux, + deep venous reflux: CFV, FV, PV    Medical Decision  Making  [name] is a 59 y.o. male who presents with: varicose veins with complications, BLE chronic venous insufficiency (C4).   Based on the patient's history and examination, I recommend: compressive therapy.  I discussed with the patient the use of her 20-30 mm thigh high compression stockings and need for 3 month trial of such.  The patient will follow up in 3 months with my partners in the [facility] for evaluation for: EVLA R GSV followed by EVLA L GSV.  Thank you for allowing [place] to participate in this patient's care.  Adele Barthel, MD [facility] Office: [phone] Pager: [phone]  04/27/2015, 12:04 PM

## 2015-05-02 ENCOUNTER — Other Ambulatory Visit: Payer: Self-pay | Admitting: Cardiology

## 2015-05-12 ENCOUNTER — Other Ambulatory Visit: Payer: Self-pay | Admitting: Cardiology

## 2015-06-03 ENCOUNTER — Encounter (HOSPITAL_COMMUNITY): Payer: Commercial Indemnity

## 2015-06-03 ENCOUNTER — Encounter: Payer: Commercial Indemnity | Admitting: Vascular Surgery

## 2015-06-06 ENCOUNTER — Other Ambulatory Visit: Payer: Self-pay | Admitting: Cardiology

## 2015-06-06 NOTE — Telephone Encounter (Signed)
Pt requesting a refill on Paxil 40 mg. Would you like to refill this medication. Please advise

## 2015-06-06 NOTE — Telephone Encounter (Signed)
Okay to refill? 

## 2015-07-27 ENCOUNTER — Encounter: Payer: Self-pay | Admitting: Vascular Surgery

## 2015-08-01 ENCOUNTER — Ambulatory Visit (INDEPENDENT_AMBULATORY_CARE_PROVIDER_SITE_OTHER): Payer: 59 | Admitting: Vascular Surgery

## 2015-08-01 ENCOUNTER — Encounter: Payer: Self-pay | Admitting: Vascular Surgery

## 2015-08-01 VITALS — BP 135/76 | HR 64 | Temp 97.3°F | Resp 16 | Ht 73.0 in | Wt 327.0 lb

## 2015-08-01 DIAGNOSIS — I83893 Varicose veins of bilateral lower extremities with other complications: Secondary | ICD-10-CM | POA: Diagnosis not present

## 2015-08-01 NOTE — Progress Notes (Addendum)
Subjective:     Patient ID: [name], male   DOB: [birth year], 59 y.o.   MRN: [id number]  HPI this 59 year old male returns having been evaluated by Dr. Adele Barthel months ago for bilateral edema and pain. He was found to have gross reflux in bilateral great saphenous veins. He has been treated with long leg elastic compression stockings 20-30 millimeter gradient as well as elevation and ibuprofen. He has had no improvement in his symptoms which include swelling aching and burning discomfort in both legs right worse than left. His symptoms continued to bother him throughout the day and are affecting his daily living. He has no history of DVT thrombophlebitis stasis ulcers or bleeding. He has noted significant darkening of the skin right worse than left over the past 2 years.  Past Medical History  Diagnosis Date  . Coronary artery disease   . Hyperlipidemia   . Panic attacks     hx of  . Exogenous obesity   . History of sleep apnea   . Depression with anxiety     hx of  . Hypertension   . Arthritis of knee     seeing Dr. Alfonso Ramus  . Smoker unmotivated to quit   . Varicose veins     Social History  Substance Use Topics  . Smoking status: Current Every Day Smoker -- 1.00 packs/day for 40 years    Types: Cigarettes  . Smokeless tobacco: Never Used  . Alcohol Use: 0.0 oz/week    0 Standard drinks or equivalent per week     Comment: occasionally    Family History  Problem Relation Age of Onset  . Heart disease Father     had cabg  . Cancer Father 53    esophageal  . Cancer Brother     lung cancer  . Colon cancer Neg Hx     No Known Allergies   Current outpatient prescriptions:  .  aspirin 325 MG tablet, Take 325 mg by mouth daily.  , Disp: , Rfl:  .  clopidogrel (PLAVIX) 75 MG tablet, take 1 tablet by mouth once daily, Disp: 30 tablet, Rfl: 3 .  furosemide (LASIX) 20 MG tablet, Take 1 tablet (20 mg total) by mouth daily as needed., Disp: 30 tablet, Rfl: 5 .  losartan  (COZAAR) 100 MG tablet, take 1 tablet by mouth once daily, Disp: 30 tablet, Rfl: 3 .  metoprolol succinate (TOPROL-XL) 25 MG 24 hr tablet, take 1/2 tablet by mouth once daily, Disp: 45 tablet, Rfl: 3 .  nitroGLYCERIN (NITROSTAT) 0.4 MG SL tablet, Place 1 tablet (0.4 mg total) under the tongue every 5 (five) minutes as needed for chest pain., Disp: 25 tablet, Rfl: prn .  PARoxetine (PAXIL) 40 MG tablet, take 1 tablet by mouth once daily, Disp: 30 tablet, Rfl: 5 .  rosuvastatin (CRESTOR) 20 MG tablet, Take 1 tablet (20 mg total) by mouth at bedtime., Disp: 90 tablet, Rfl: 3 .  azithromycin (ZITHROMAX) 250 MG tablet, Day 1: Take 2 daily. Days 2-5: Take 1 daily. (Patient not taking: Reported on 04/27/2015), Disp: 6 tablet, Rfl: 0 .  meclizine (ANTIVERT) 25 MG tablet, Take 1 tablet (25 mg total) by mouth 2 (two) times daily. (Patient not taking: Reported on 04/27/2015), Disp: 60 tablet, Rfl: 5  Filed Vitals:   08/01/15 1435  BP: 135/76  Pulse: 64  Temp: 97.3 F (36.3 C)  Resp: 16  Height: '6\' 1"'$  (1.854 m)  Weight: 327 lb (148.326 kg)  SpO2: 96%  Body mass index is 43.15 kg/(m^2).           Review of Systems denies chest pain but does have mild dyspnea on exertion. No claudication symptoms.    Objective:   Physical Exam BP 135/76 mmHg  Pulse 64  Temp(Src) 97.3 F (36.3 C)  Resp 16  Ht '6\' 1"'$  (1.854 m)  Wt 327 lb (148.326 kg)  BMI 43.15 kg/m2  SpO2 96%  General obese male in no apparent distress alert and oriented 3 Lungs no rhonchi or wheezing Cardiovascular regular rhythm no murmurs Right leg with severe hyperpigmentation midcalf distally with large network of reticular veins around medial malleolus. Bulging varicosities in right posterior calf extending medially and anteriorly. Left leg with extensive reticular veins and some bulging varicosities and medial calf. Both legs have 1+ chronic edema  Patient does have documented gross reflux in both great saphenous veins on  formal venous duplex exam performed 3 months ago supplying these painful varicosities Today I performed a bedside independent sono  site ultrasound exam and agree the patient does have gross reflux in bilateral great saphenous veins with a very large saphenous vein on the right leg extending down into the calf.    Assessment:     Bilateral edema right worse than left with painful varicosities and significant hyperpigmentation of skin with no active ulceration. This is causing symptoms which are affecting patient's daily living are resistant to conservative measures including long-leg elastic compression stocking 20-30 millimeters gradient, elevation, and ibuprofen. This patient works at a [employer]/field se[employer] and is on his feet most of the day and the symptoms are affecting his ability to work    Plan:     Patient needs number-one laser ablation right great saphenous vein +10-20 stab phlebectomy of painful varicosities followed by 2 courses of sclerotherapy #2 patient needs laser ablation left great saphenous vein followed by 2 courses of sclerotherapy.  We will proceed with precertification to perform this in the near future and hopefully improve this patient's severe symptoms.      addendum----- as noted above, I did independently image with the ultrasound-sono site both lower extremities and observed reflux at the saphenofemoral junction and both lower extremities.   because of patient's severe skin changes chronic edema and painful varicosities-I do feel that laser ablation of both great saphenous veins would be in his best interest.

## 2015-08-02 ENCOUNTER — Ambulatory Visit: Payer: Commercial Indemnity | Admitting: Vascular Surgery

## 2015-08-16 ENCOUNTER — Other Ambulatory Visit: Payer: Self-pay | Admitting: *Deleted

## 2015-08-16 DIAGNOSIS — I83893 Varicose veins of bilateral lower extremities with other complications: Secondary | ICD-10-CM

## 2015-09-05 ENCOUNTER — Other Ambulatory Visit: Payer: Self-pay | Admitting: Cardiology

## 2015-09-07 ENCOUNTER — Other Ambulatory Visit: Payer: Self-pay | Admitting: Cardiology

## 2015-09-09 ENCOUNTER — Encounter: Payer: Self-pay | Admitting: Vascular Surgery

## 2015-09-16 ENCOUNTER — Encounter: Payer: Self-pay | Admitting: Vascular Surgery

## 2015-09-16 ENCOUNTER — Telehealth: Payer: Self-pay | Admitting: *Deleted

## 2015-09-16 ENCOUNTER — Telehealth: Payer: Self-pay | Admitting: Cardiology

## 2015-09-16 NOTE — Telephone Encounter (Signed)
Noted. Thanks.

## 2015-09-16 NOTE — Telephone Encounter (Signed)
Patient called asking if he was to stop taking his Plavix and Aspirin before his Laser Ablation and Stab Phlebectomy procedure on Monday 09/19/15.  He has not taken this today.  After trying to contact [name] without response I called Dr Donnetta Hutching. Dr Early instructed patient to remain off both Plavix and Aspirin through Monday.  I called patient back to inform him of the instructions and also I called Dr Bobbye Riggs office to give them notice. Patient voiced understanding of the instructions.

## 2015-09-16 NOTE — Telephone Encounter (Signed)
New message     FYI Pt was instructed by Dr Donnetta Hutching to hold plavix and aspriin starting today for a procedure scheduled for Monday---laser ablation on his varicose veins.  They wanted Dr Mare Ferrari to know.

## 2015-09-16 NOTE — Telephone Encounter (Signed)
Will forward to  Dr. Brackbill so he will be aware 

## 2015-09-19 ENCOUNTER — Ambulatory Visit (INDEPENDENT_AMBULATORY_CARE_PROVIDER_SITE_OTHER): Payer: 59 | Admitting: Vascular Surgery

## 2015-09-19 ENCOUNTER — Encounter: Payer: Self-pay | Admitting: Vascular Surgery

## 2015-09-19 VITALS — BP 138/79 | HR 64 | Temp 97.6°F | Resp 16 | Ht 73.0 in | Wt 325.0 lb

## 2015-09-19 DIAGNOSIS — I83891 Varicose veins of right lower extremities with other complications: Secondary | ICD-10-CM | POA: Diagnosis not present

## 2015-09-19 NOTE — Progress Notes (Signed)
Subjective:     Patient ID: [name], male   DOB: 12-26-1955, 60 y.o.   MRN: [id number]  HPI This 60 year old male had laser ablation of the right great saphenous vein from the proximal calf to near the saphenofemoral junction plus multiple stab phlebectomy of painful varicosities performed uder local tuescent anestesia. A total of 2065 J of energy was utlized. He tolerated the procedre well.   Review of Systems     Objective:   Physical Exam BP 138/79 mmHg  Pulse 64  Temp(Src) 97.6 F (36.4 C)  Resp 16  Ht '6\' 1"'$  (1.854 m)  Wt 325 lb (147.419 kg)  BMI 42.89 kg/m2  SpO2 99%       Assessment:      well-tolerated laser ablation right great saphenous vein plus tended 20 stab phlebectomy of painful varicosities performed under local tumescent anesthesia     Plan:      return in 1 week for venous duplex exam to confirm closure right great saphenous vein We will then proceed with similar procedure on contralatera left leg

## 2015-09-19 NOTE — Progress Notes (Signed)
Laser Ablation Procedure    Date: 09/19/2015   TYWON NIDAY DOB:[birth year]  Consent signed: Yes    Surgeon:  Dr. Nelda Severe. [name]  Procedure: Laser Ablation: right Greater Saphenous Vein  BP 138/79 mmHg  Pulse 64  Temp(Src) 97.6 F (36.4 C)  Resp 16  Ht '6\' 1"'$  (1.854 m)  Wt 325 lb (147.419 kg)  BMI 42.89 kg/m2  SpO2 99%  Tumescent Anesthesia: 400 cc 0.9% NaCl with 50 cc Lidocaine HCL with 1% Epi and 15 cc 8.4% NaHCO3  Local Anesthesia: 7 cc Lidocaine HCL and NaHCO3 (ratio 2:1)  Pulsed Mode: 15 watts, 537m delay, 1.0 duration  Total Energy: 2065             Total Pulses:  138              Total Time: 2:18    Stab Phlebectomy: 10-20 Sites: Calf and Ankle  Patient tolerated procedure well  Notes:   Description of Procedure:  After marking the course of the secondary varicosities, the patient was placed on the operating table in the supine position, and the right leg was prepped and draped in sterile fashion.   Local anesthetic was administered and under ultrasound guidance the saphenous vein was accessed with a micro needle and guide wire; then the mirco puncture sheath was placed.  A guide wire was inserted saphenofemoral junction , followed by a 5 french sheath.  The position of the sheath and then the laser fiber below the junction was confirmed using the ultrasound.  Tumescent anesthesia was administered along the course of the saphenous vein using ultrasound guidance. The patient was placed in Trendelenburg position and protective laser glasses were placed on patient and staff, and the laser was fired at 15 watts continuous mode advancing 1-245msecond for a total of 2065 joules.   For stab phlebectomies, local anesthetic was administered at the previously marked varicosities, and tumescent anesthesia was administered around the vessels.  Ten to 20 stab wounds were made using the tip of an 11 blade. And using the vein hook, the phlebectomies were performed using a hemostat  to avulse the varicosities.  Adequate hemostasis was achieved.     Steri strips were applied to the stab wounds and ABD pads and thigh high compression stockings were applied.  Ace wrap bandages were applied over the phlebectomy sites and at the top of the saphenofemoral junction. Blood loss was less than 15 cc.  The patient ambulated out of the operating room having tolerated the procedure well.

## 2015-09-20 ENCOUNTER — Telehealth: Payer: Self-pay | Admitting: *Deleted

## 2015-09-20 NOTE — Telephone Encounter (Signed)
Asked the pt to call me if he is having any problems or if he has questions.

## 2015-09-26 ENCOUNTER — Ambulatory Visit (INDEPENDENT_AMBULATORY_CARE_PROVIDER_SITE_OTHER): Payer: 59 | Admitting: Vascular Surgery

## 2015-09-26 ENCOUNTER — Ambulatory Visit (HOSPITAL_COMMUNITY)
Admission: RE | Admit: 2015-09-26 | Discharge: 2015-09-26 | Disposition: A | Discharge status: Home / Self Care | Payer: 59 | Source: Ambulatory Visit | Attending: Vascular Surgery | Admitting: Vascular Surgery

## 2015-09-26 ENCOUNTER — Encounter: Payer: Self-pay | Admitting: Vascular Surgery

## 2015-09-26 VITALS — BP 126/72 | HR 58 | Temp 97.7°F | Resp 16 | Ht 73.0 in | Wt 325.0 lb

## 2015-09-26 DIAGNOSIS — E785 Hyperlipidemia, unspecified: Secondary | ICD-10-CM | POA: Diagnosis not present

## 2015-09-26 DIAGNOSIS — I83891 Varicose veins of right lower extremities with other complications: Secondary | ICD-10-CM

## 2015-09-26 DIAGNOSIS — Z9889 Other specified postprocedural states: Secondary | ICD-10-CM | POA: Insufficient documentation

## 2015-09-26 DIAGNOSIS — I10 Essential (primary) hypertension: Secondary | ICD-10-CM | POA: Insufficient documentation

## 2015-09-26 DIAGNOSIS — F17200 Nicotine dependence, unspecified, uncomplicated: Secondary | ICD-10-CM | POA: Insufficient documentation

## 2015-09-26 DIAGNOSIS — I83893 Varicose veins of bilateral lower extremities with other complications: Secondary | ICD-10-CM

## 2015-09-26 NOTE — Progress Notes (Signed)
Subjective:     Patient ID: [name], male   DOB: 1956/06/13, 60 y.o.   MRN: [id number]  HPI this 60 year old male returns 1 week post-laser ablation right great saphenous vein plus greater than 20 stab phlebectomy of painful varicosities. He states that he has had minimal discomfort in the right leg and is a swelling in the ankle has improved. He has had no pain at the stab phlebectomy sites. He denies any chest pain dyspnea on exertion or hemoptysis. He tolerated the ibuprofen well and has worn his less compression stocking.   Review of Systems     Objective:   Physical Exam BP 126/72 mmHg  Pulse 58  Temp(Src) 97.7 F (36.5 C)  Resp 16  Ht '6\' 1"'$  (1.854 m)  Wt 325 lb (147.419 kg)  BMI 42.89 kg/m2  SpO2 97%  Gen. well-developed well-nourished male in no apparent distress alert and oriented 3 Right leg with moderate ecchymosis and mid thigh. Great saphenous vein is palpable beneath the skin mildly tender. Diffuse reticular and spider veins lower third of the leg with 1+ edema. All stab phlebectomy y sites are healing well. 3+ dorsalis pedis pulse palpable.  Today I ordered a venous duplex exam of the right leg which I reviewed and interpreted. There is no DVT. There is total closure the great saphenous vein from the proximal calf to near the saphenofemoral junction     Assessment:     Well-tolerated and successful laser ablation right great saphenous vein plus multiple stab phlebectomy of painful varicosities    Plan:     Return in 1 week for laser ablation left great saphenous vein and patient will then have sclerotherapy in the near future

## 2015-09-29 ENCOUNTER — Other Ambulatory Visit (INDEPENDENT_AMBULATORY_CARE_PROVIDER_SITE_OTHER): Payer: 59 | Admitting: *Deleted

## 2015-09-29 ENCOUNTER — Ambulatory Visit (INDEPENDENT_AMBULATORY_CARE_PROVIDER_SITE_OTHER): Payer: 59 | Admitting: Cardiology

## 2015-09-29 ENCOUNTER — Encounter: Payer: Self-pay | Admitting: Cardiology

## 2015-09-29 VITALS — BP 134/76 | HR 60 | Ht 73.0 in | Wt 328.8 lb

## 2015-09-29 DIAGNOSIS — I119 Hypertensive heart disease without heart failure: Secondary | ICD-10-CM

## 2015-09-29 DIAGNOSIS — E7800 Pure hypercholesterolemia, unspecified: Secondary | ICD-10-CM | POA: Diagnosis not present

## 2015-09-29 DIAGNOSIS — I259 Chronic ischemic heart disease, unspecified: Secondary | ICD-10-CM

## 2015-09-29 LAB — HEPATIC FUNCTION PANEL
ALT: 20 U/L (ref 9–46)
AST: 17 U/L (ref 10–35)
Albumin: 4.3 g/dL (ref 3.6–5.1)
Alkaline Phosphatase: 56 U/L (ref 40–115)
BILIRUBIN DIRECT: 0.1 mg/dL (ref <=0.2)
BILIRUBIN INDIRECT: 0.5 mg/dL (ref 0.2–1.2)
BILIRUBIN TOTAL: 0.6 mg/dL (ref 0.2–1.2)
Total Protein: 7 g/dL (ref 6.1–8.1)

## 2015-09-29 LAB — LIPID PANEL
CHOLESTEROL: 120 mg/dL — AB (ref 125–200)
HDL: 26 mg/dL — ABNORMAL LOW (ref >=40)
LDL Cholesterol: 72 mg/dL (ref <130)
Total CHOL/HDL Ratio: 4.6 Ratio (ref <=5.0)
Triglycerides: 109 mg/dL (ref <150)
VLDL: 22 mg/dL (ref <30)

## 2015-09-29 LAB — BASIC METABOLIC PANEL
BUN: 14 mg/dL (ref 7–25)
CO2: 28 mmol/L (ref 20–31)
Calcium: 9.3 mg/dL (ref 8.6–10.3)
Chloride: 104 mmol/L (ref 98–110)
Creat: 0.79 mg/dL (ref 0.70–1.33)
GLUCOSE: 101 mg/dL — AB (ref 65–99)
POTASSIUM: 4.2 mmol/L (ref 3.5–5.3)
Sodium: 139 mmol/L (ref 135–146)

## 2015-09-29 NOTE — Addendum Note (Signed)
Addended by: Eulis Foster on: 09/29/2015 08:35 AM   Modules accepted: Orders

## 2015-09-29 NOTE — Progress Notes (Signed)
Quick Note:  Please report to patient. The recent labs are stable. Continue same medication and careful diet. ______ 

## 2015-09-29 NOTE — Patient Instructions (Signed)
Medication Instructions:  Your physician recommends that you continue on your current medications as directed. Please refer to the Current Medication list given to you today.  Labwork: Lp/bmet/hfp  Testing/Procedures: none  Follow-Up: Your physician wants you to follow-up in: 6 month ov with Dr Blima Singer will receive a reminder letter in the mail two months in advance. If you don't receive a letter, please call our office to schedule the follow-up appointment.  Any Other Special Instructions Will Be Listed Below (If Applicable).     If you need a refill on your cardiac medications before your next appointment, please call your pharmacy.

## 2015-09-29 NOTE — Progress Notes (Signed)
Cardiology Office Note   Date:  09/29/2015   ID:  Stryker, Veasey [birth year], MRN [id number]  PCP:  Karis Juba, PA-C  Cardiologist: Darlin Coco MD  Chief Complaint  Patient presents with  . routine follow up      History of Present Illness: [name] is a 60 y.o. male who presents for 6 month follow-up visit.  He has a history of known ischemic heart disease. On 09/06/08 he underwent successful stenting of his mid LAD and proximal LAD by Dr. [name] with drug-eluting stents. He has done well since that time. He remains on dual antiplatelet therapy. He has not been having any bleeding problems. The patient has not been experiencing any exertional chest discomfort. Patient denies any dizzy spells or syncope. No palpitations. Blood pressure has been remaining stable on current medication. No headaches or dizziness. For his cholesterol the patient takes Crestor 20 mg daily. He is not having any myalgias. We are checking lab work today. The patient has a history of varicose veins.  Since last visit he has undergone endovascular ablation of his veins by Dr. [name].  He is very pleased with the results.  The right leg has been done and they are contemplating also doing the left leg.    Past Medical History  Diagnosis Date  . Coronary artery disease   . Hyperlipidemia   . Panic attacks     hx of  . Exogenous obesity   . History of sleep apnea   . Depression with anxiety     hx of  . Hypertension   . Arthritis of knee     seeing Dr. Alfonso Ramus  . Smoker unmotivated to quit   . Varicose veins     Past Surgical History  Procedure Laterality Date  . Cardiac catheterization  09/06/2008    stent to mid LAD and proximal LAD  . Polyps of lung  2008    removed by [facility]  . Colonoscopy N/A 05/01/2013    Procedure: COLONOSCOPY;  Surgeon: [name], MD;  Location: [facility];  Service: Endoscopy;  Laterality: N/A;  8:30 AM  . Tonsillectomy        Current Outpatient Prescriptions  Medication Sig Dispense Refill  . aspirin 325 MG tablet Take 325 mg by mouth daily.      . clopidogrel (PLAVIX) 75 MG tablet take 1 tablet by mouth once daily 30 tablet 5  . furosemide (LASIX) 20 MG tablet Take 20 mg by mouth daily as needed for edema.    Marland Kitchen losartan (COZAAR) 100 MG tablet take 1 tablet by mouth once daily 30 tablet 5  . meclizine (ANTIVERT) 25 MG tablet Take 25 mg by mouth 2 (two) times daily as needed for dizziness.    . metoprolol succinate (TOPROL-XL) 25 MG 24 hr tablet take 1/2 tablet by mouth once daily 45 tablet 3  . nitroGLYCERIN (NITROSTAT) 0.4 MG SL tablet Place 1 tablet (0.4 mg total) under the tongue every 5 (five) minutes as needed for chest pain. 25 tablet prn  . PARoxetine (PAXIL) 40 MG tablet take 1 tablet by mouth once daily 30 tablet 5  . rosuvastatin (CRESTOR) 20 MG tablet Take 1 tablet (20 mg total) by mouth at bedtime. 90 tablet 3   No current facility-administered medications for this visit.    Allergies:   Review of patient's allergies indicates no known allergies.    Social History:  The patient  reports that he has  been smoking Cigarettes.  He has a 40 pack-year smoking history. He has never used smokeless tobacco. He reports that he drinks alcohol. He reports that he does not use illicit drugs.   Family History:  The patient's family history includes Cancer in his brother; Cancer (age of onset: 17) in his father; Heart disease in his father. There is no history of Colon cancer.    ROS:  Please see the history of present illness.   Otherwise, review of systems are positive for none.   All other systems are reviewed and negative.    PHYSICAL EXAM: VS:  BP 134/76 mmHg  Pulse 60  Ht '6\' 1"'$  (1.854 m)  Wt 328 lb 12.8 oz (149.143 kg)  BMI 43.39 kg/m2 , BMI Body mass index is 43.39 kg/(m^2). GEN: Well nourished, well developed, in no acute distress HEENT: normal Neck: no JVD, carotid bruits, or  masses Cardiac: RRR; no murmurs, rubs, or gallops,no edema  Respiratory:  clear to auscultation bilaterally, normal work of breathing GI: soft, nontender, nondistended, + BS MS: no deformity or atrophy.  The patient is wearing support hose on the right leg Skin: warm and dry, no rash Neuro:  Strength and sensation are intact Psych: euthymic mood, full affect   EKG:  EKG is ordered today. The ekg ordered today demonstrates sinus bradycardia at 57 bpm.  Otherwise within normal limits   Recent Labs: 03/03/2015: ALT 20; BUN 11; Creatinine, Ser 0.79; Potassium 3.9; Sodium 140    Lipid Panel    Component Value Date/Time   CHOL 138 03/03/2015 0757   TRIG 172.0* 03/03/2015 0757   HDL 26.50* 03/03/2015 0757   CHOLHDL 5 03/03/2015 0757   VLDL 34.4 03/03/2015 0757   LDLCALC 77 03/03/2015 0757      Wt Readings from Last 3 Encounters:  09/29/15 328 lb 12.8 oz (149.143 kg)  09/26/15 325 lb (147.419 kg)  09/19/15 325 lb (147.419 kg)        ASSESSMENT AND PLAN: 1. ischemic heart disease status post drug-eluting stent to mid LAD and proximal LAD on 09/06/08 for crescendo angina pectoris.  Doing well.  No recurrent angina. 2. Hypercholesterolemia.  Tolerating Crestor.  No myalgias. 3. exogenous obesity.Weight is up 3 pounds.  Needs to work harder on diet. 4. Bilateral varicose veins.  Since last visit, successful surgery on right leg by Dr. [name]   Current medicines are reviewed at length with the patient today.  The patient does not have concerns regarding medicines.  The following changes have been made:  no change  Labs/ tests ordered today include:  No orders of the defined types were placed in this encounter.     Disposition: Continue current medication.  Following my retirement he will follow-up in 6 months with Dr. Johnsie Cancel.  Berna Spare MD 09/29/2015 8:32 AM    [address], [place], [place]  [address] Phone: [phone]; Fax: [phone]

## 2015-10-03 ENCOUNTER — Other Ambulatory Visit: Payer: 59 | Admitting: Vascular Surgery

## 2015-10-10 ENCOUNTER — Ambulatory Visit: Payer: 59 | Admitting: Vascular Surgery

## 2015-10-10 ENCOUNTER — Encounter (HOSPITAL_COMMUNITY): Payer: 59

## 2015-10-11 ENCOUNTER — Encounter: Payer: Self-pay | Admitting: Vascular Surgery

## 2015-10-14 ENCOUNTER — Encounter: Payer: Self-pay | Admitting: Vascular Surgery

## 2015-10-17 ENCOUNTER — Ambulatory Visit (INDEPENDENT_AMBULATORY_CARE_PROVIDER_SITE_OTHER): Payer: 59 | Admitting: Vascular Surgery

## 2015-10-17 ENCOUNTER — Encounter: Payer: Self-pay | Admitting: Vascular Surgery

## 2015-10-17 VITALS — BP 134/73 | HR 74 | Temp 97.6°F | Resp 18 | Ht 73.0 in | Wt 325.0 lb

## 2015-10-17 DIAGNOSIS — I83892 Varicose veins of left lower extremities with other complications: Secondary | ICD-10-CM | POA: Insufficient documentation

## 2015-10-17 NOTE — Progress Notes (Signed)
Subjective:     Patient ID: [name], male   DOB: 25-May-1956, 60 y.o.   MRN: [id number]  HPI This 60 year old male had laser ablation of the left great saphenous vein performed under local tumescent anesthesia. A total of 2338 J of energy was utilized. He tolerated the procedure well.   Review of Systems     Objective:   Physical Exam BP 134/73 mmHg  Pulse 74  Temp(Src) 97.6 F (36.4 C)  Resp 18  Ht '6\' 1"'$  (1.854 m)  Wt 325 lb (147.419 kg)  BMI 42.89 kg/m2  SpO2 99%       Assessment:      well-tolerated laser ablation left great saphenous vein from proximal calf to near the saphenofemoral junction performed under local tumescent anesthesia     Plan:      return in 1 week for venous duplex exam to confirm closure left great saphenous vein This will complete patient's treatment regimen

## 2015-10-17 NOTE — Progress Notes (Signed)
Laser Ablation Procedure    Date: 10/17/2015   [name] DOB:08/30/1955  Consent signed: Yes    Surgeon:  Dr. Nelda Severe. [name]  Procedure: Laser Ablation: left Greater Saphenous Vein  BP 134/73 mmHg  Pulse 74  Temp(Src) 97.6 F (36.4 C)  Resp 18  Ht '6\' 1"'$  (1.854 m)  Wt 325 lb (147.419 kg)  BMI 42.89 kg/m2  SpO2 99%  Tumescent Anesthesia: 400 cc 0.9% NaCl with 50 cc Lidocaine HCL with 1% Epi and 15 cc 8.4% NaHCO3  Local Anesthesia: 4 cc Lidocaine HCL and NaHCO3 (ratio 2:1)  Pulsed Mode: 15 watts, 537m delay, 1.0 duration  Total Energy: 2334             Total Pulses:  156              Total Time:2:35     Patient tolerated procedure well  Notes:   Description of Procedure:  After marking the course of the secondary varicosities, the patient was placed on the operating table in the supine position, and the left leg was prepped and draped in sterile fashion.   Local anesthetic was administered and under ultrasound guidance the saphenous vein was accessed with a micro needle and guide wire; then the mirco puncture sheath was placed.  A guide wire was inserted saphenofemoral junction , followed by a 5 french sheath.  The position of the sheath and then the laser fiber below the junction was confirmed using the ultrasound.  Tumescent anesthesia was administered along the course of the saphenous vein using ultrasound guidance. The patient was placed in Trendelenburg position and protective laser glasses were placed on patient and staff, and the laser was fired at 15 watts continuous mode advancing 1-252msecond for a total of 2334 joules.     Steri strips were applied to the stab wounds and ABD pads and thigh high compression stockings were applied.  Ace wrap bandages were applied over the phlebectomy sites and at the top of the saphenofemoral junction. Blood loss was less than 15 cc.  The patient ambulated out of the operating room having tolerated the procedure well.

## 2015-10-18 ENCOUNTER — Telehealth: Payer: Self-pay | Admitting: *Deleted

## 2015-10-18 NOTE — Telephone Encounter (Signed)
Pt doing well. Non pain or problems. Following all instructions.

## 2015-10-24 ENCOUNTER — Ambulatory Visit (INDEPENDENT_AMBULATORY_CARE_PROVIDER_SITE_OTHER): Payer: 59 | Admitting: Vascular Surgery

## 2015-10-24 ENCOUNTER — Ambulatory Visit (HOSPITAL_COMMUNITY)
Admission: RE | Admit: 2015-10-24 | Discharge: 2015-10-24 | Disposition: A | Discharge status: Home / Self Care | Payer: 59 | Source: Ambulatory Visit | Attending: Vascular Surgery | Admitting: Vascular Surgery

## 2015-10-24 ENCOUNTER — Encounter: Payer: Self-pay | Admitting: Vascular Surgery

## 2015-10-24 VITALS — BP 125/77 | HR 60 | Temp 97.2°F | Resp 14 | Ht 73.0 in | Wt 325.0 lb

## 2015-10-24 DIAGNOSIS — E785 Hyperlipidemia, unspecified: Secondary | ICD-10-CM | POA: Diagnosis not present

## 2015-10-24 DIAGNOSIS — F17200 Nicotine dependence, unspecified, uncomplicated: Secondary | ICD-10-CM | POA: Diagnosis not present

## 2015-10-24 DIAGNOSIS — I83893 Varicose veins of bilateral lower extremities with other complications: Secondary | ICD-10-CM

## 2015-10-24 DIAGNOSIS — I10 Essential (primary) hypertension: Secondary | ICD-10-CM | POA: Insufficient documentation

## 2015-10-24 DIAGNOSIS — Z9889 Other specified postprocedural states: Secondary | ICD-10-CM | POA: Insufficient documentation

## 2015-10-24 DIAGNOSIS — I83892 Varicose veins of left lower extremities with other complications: Secondary | ICD-10-CM

## 2015-10-24 NOTE — Progress Notes (Signed)
Subjective:     Patient ID: [name], male   DOB: 06/11/1956, 60 y.o.   MRN: [id number]  HPI this 60 year old male returns 1 week post-laser ablation left great saphenous vein performed for gross reflux with distal edema and severe hyperpigmentation left leg. He has done well following his procedure. He states that the swelling in both legs is significantly less than it was prior to his ablation procedures. He has noticed less tightness in the lower legs. He has had no chest pain, dyspnea on exertion, PND, orthopnea, or hemoptysis. He has wearing his elastic compression stocking and taking ibuprofen as instructed.   Review of Systems     Objective:   Physical Exam BP 125/77 mmHg  Pulse 60  Temp(Src) 97.2 F (36.2 C)  Resp 14  Ht '6\' 1"'$  (1.854 m)  Wt 325 lb (147.419 kg)  BMI 42.89 kg/m2  SpO2 98%  Gen. well-developed well-nourished male in no apparent distress alert and oriented 3 Left leg with mild discomfort to palpation of great saphenous vein from distal thigh to near the saphenofemoral junction. 1+ distal edema. 3+ dorsalis pedis pulse palpable.  Today I ordered a venous duplex exam of the left leg which I reviewed and interpreted. There is no DVT. There is total closure of the left great saphenous vein from the distal thigh to near the saphenofemoral junction.     Assessment:     Successful laser ablation left great saphenous vein performed for gross reflux and large caliber vein with chronic edema and hyperpigmentation left leg    Plan:     Patient to return in mid March for sclerotherapy to complete his treatment regimen

## 2015-11-07 ENCOUNTER — Encounter: Payer: Self-pay | Admitting: *Deleted

## 2015-11-10 ENCOUNTER — Ambulatory Visit (INDEPENDENT_AMBULATORY_CARE_PROVIDER_SITE_OTHER): Payer: 59 | Admitting: *Deleted

## 2015-11-10 DIAGNOSIS — I83893 Varicose veins of bilateral lower extremities with other complications: Secondary | ICD-10-CM | POA: Diagnosis not present

## 2015-11-10 NOTE — Progress Notes (Signed)
X=.3% Sotradecol administered with a 27g butterfly.  Patient received a total of 24cc.  Treated all areas of concern. Lots of spiders (large legs) that look like they could bleed. Easy access. Tol well. Follow prn.  Photos: No.  Compression stockings applied: Yes.

## 2015-11-18 ENCOUNTER — Encounter: Payer: Self-pay | Admitting: Vascular Surgery

## 2015-12-08 ENCOUNTER — Encounter: Payer: Self-pay | Admitting: Family Medicine

## 2015-12-08 ENCOUNTER — Other Ambulatory Visit: Payer: Self-pay | Admitting: *Deleted

## 2015-12-08 NOTE — Telephone Encounter (Signed)
Patient will need to follow up with PCP for this medication. Dr. Johnsie Cancel has not even seen this patient yet. It looks like his PCP is Dr. Dena Billet.

## 2015-12-08 NOTE — Telephone Encounter (Signed)
Pt has not been seen in this office since May 2015.  Medication refills denied until he is seen in this office.

## 2015-12-08 NOTE — Telephone Encounter (Signed)
I saw him 02/25/13 as a new patient. Since then, he has only had one other visit here and that was for a respiratory infection May 2015. Note just sent to me shows that Dr. Mare Ferrari, Cardiologist, had been refilling this medicine recently. When I reviewed my note from 02/2013, according to that note--it seems that his prior PCP had been prescribing this medicine--- I don't think he had been seeing a psychiatrist or any specialist. -----Patient needs follow-up office visit.---- Schedule OV.

## 2015-12-09 ENCOUNTER — Other Ambulatory Visit: Payer: Self-pay | Admitting: *Deleted

## 2015-12-09 MED ORDER — METOPROLOL SUCCINATE ER 25 MG PO TB24: 12.5000 mg | ORAL_TABLET | Freq: Every day | ORAL | Status: DC

## 2015-12-10 NOTE — Telephone Encounter (Signed)
Primary should fill paxil

## 2015-12-19 ENCOUNTER — Telehealth: Payer: Self-pay

## 2015-12-19 NOTE — Telephone Encounter (Signed)
got voicemail from patient requesting refill of paxil. I called and explained this would need to come from his PCP not Dr. Johnsie Cancel. Patient stated his understanding and said he would have to call his PCP and get an appointment but that he did have some pills left. He stated he will be calling his PCP.

## 2015-12-21 ENCOUNTER — Encounter: Payer: Self-pay | Admitting: Physician Assistant

## 2015-12-21 ENCOUNTER — Ambulatory Visit (INDEPENDENT_AMBULATORY_CARE_PROVIDER_SITE_OTHER): Payer: 59 | Admitting: Physician Assistant

## 2015-12-21 VITALS — BP 130/76 | HR 68 | Temp 98.3°F | Resp 18 | Ht 72.0 in | Wt 319.0 lb

## 2015-12-21 DIAGNOSIS — E7800 Pure hypercholesterolemia, unspecified: Secondary | ICD-10-CM

## 2015-12-21 DIAGNOSIS — F17210 Nicotine dependence, cigarettes, uncomplicated: Secondary | ICD-10-CM

## 2015-12-21 DIAGNOSIS — Z8669 Personal history of other diseases of the nervous system and sense organs: Secondary | ICD-10-CM

## 2015-12-21 DIAGNOSIS — I259 Chronic ischemic heart disease, unspecified: Secondary | ICD-10-CM

## 2015-12-21 DIAGNOSIS — Z87898 Personal history of other specified conditions: Secondary | ICD-10-CM | POA: Diagnosis not present

## 2015-12-21 DIAGNOSIS — F410 Panic disorder [episodic paroxysmal anxiety] without agoraphobia: Secondary | ICD-10-CM | POA: Diagnosis not present

## 2015-12-21 DIAGNOSIS — I10 Essential (primary) hypertension: Secondary | ICD-10-CM | POA: Insufficient documentation

## 2015-12-21 DIAGNOSIS — Z0000 Encounter for general adult medical examination without abnormal findings: Secondary | ICD-10-CM

## 2015-12-21 DIAGNOSIS — Z23 Encounter for immunization: Secondary | ICD-10-CM

## 2015-12-21 DIAGNOSIS — E669 Obesity, unspecified: Secondary | ICD-10-CM | POA: Diagnosis not present

## 2015-12-21 DIAGNOSIS — F17200 Nicotine dependence, unspecified, uncomplicated: Secondary | ICD-10-CM

## 2015-12-21 DIAGNOSIS — E6609 Other obesity due to excess calories: Secondary | ICD-10-CM

## 2015-12-21 LAB — CBC WITH DIFFERENTIAL/PLATELET
Basophils Absolute: 0 cells/uL (ref 0–200)
Basophils Relative: 0 %
EOS ABS: 152 {cells}/uL (ref 15–500)
EOS PCT: 2 %
HCT: 46.9 % (ref 38.5–50.0)
Hemoglobin: 15.8 g/dL (ref 13.0–17.0)
LYMPHS PCT: 22 %
Lymphs Abs: 1672 cells/uL (ref 850–3900)
MCH: 32.9 pg (ref 27.0–33.0)
MCHC: 33.7 g/dL (ref 32.0–36.0)
MCV: 97.7 fL (ref 80.0–100.0)
MONO ABS: 532 {cells}/uL (ref 200–950)
MPV: 8.7 fL (ref 7.5–12.5)
Monocytes Relative: 7 %
Neutro Abs: 5244 cells/uL (ref 1500–7800)
Neutrophils Relative %: 69 %
PLATELETS: 156 10*3/uL (ref 140–400)
RBC: 4.8 MIL/uL (ref 4.20–5.80)
RDW: 13.4 % (ref 11.0–15.0)
WBC: 7.6 10*3/uL (ref 3.8–10.8)

## 2015-12-21 LAB — COMPLETE METABOLIC PANEL WITH GFR
ALT: 22 U/L (ref 9–46)
AST: 16 U/L (ref 10–35)
Albumin: 4.3 g/dL (ref 3.6–5.1)
Alkaline Phosphatase: 65 U/L (ref 40–115)
BUN: 15 mg/dL (ref 7–25)
CHLORIDE: 102 mmol/L (ref 98–110)
CO2: 21 mmol/L (ref 20–31)
CREATININE: 0.73 mg/dL (ref 0.70–1.33)
Calcium: 9.3 mg/dL (ref 8.6–10.3)
GFR, Est African American: >89 mL/min (ref >=60)
Glucose, Bld: 92 mg/dL (ref 70–99)
POTASSIUM: 4.3 mmol/L (ref 3.5–5.3)
SODIUM: 139 mmol/L (ref 135–146)
Total Bilirubin: 0.6 mg/dL (ref 0.2–1.2)
Total Protein: 7.3 g/dL (ref 6.1–8.1)

## 2015-12-21 LAB — LIPID PANEL
CHOLESTEROL: 133 mg/dL (ref 125–200)
HDL: 39 mg/dL — ABNORMAL LOW (ref >=40)
LDL CALC: 77 mg/dL (ref <130)
Total CHOL/HDL Ratio: 3.4 Ratio (ref <=5.0)
Triglycerides: 86 mg/dL (ref <150)
VLDL: 17 mg/dL (ref <30)

## 2015-12-21 LAB — TSH: TSH: 1.71 mIU/L (ref 0.40–4.50)

## 2015-12-21 MED ORDER — PAROXETINE HCL 40 MG PO TABS: 40.0000 mg | ORAL_TABLET | Freq: Every day | ORAL | Status: DC

## 2015-12-21 NOTE — Progress Notes (Signed)
Patient ID: [name] MRN: [id number], DOB: 14-Apr-1956 60 y.o. Date of Encounter: 12/21/2015, 11:39 AM    Chief Complaint: Physical (CPE)  HPI: 60 y.o. y/o white male here for CPE and to re-establish Primary Care here.    02/25/2013: He saw Dr. Melanee Spry in the past "until he went to that system where you have to pay a bunch of money upfront." LOV there was 07/2008.  He sees Dr. Mare Ferrari (Cardiology) routinely so he has refilled all his meds etc so far but he has recommended that he needs to establish with a PCP.  Panic/Anxiety: Says he was started on Paxil in 1994 b/c of panic and anxiety. (not depression). Says he was on small dose initially and over time, dose has slowly been increased. Now on '40mg'$  and this works   well. His mood is stable with no panic or anxiety.   CAD     Per Dr Mare Ferrari HTN     Per Dr. Mare Ferrari HLD      Per Dr Mare Ferrari Obesity; He is well aware of need for diet, exercise, weight loss Smoking: He is well aware of need to stop smoking and risk this has to his health. He refuses med for this and says he has cut back a whole lot. Smoked 2-3 ppd in past. Now 1/2 to 1 ppd.  12/21/2015: He states that since his last physical with me July 2014, he had continued to get refills from Dr. Mare Ferrari. However, says that he has retired and he saw a Manufacturing engineer for his last visit-- he told him he would need to get a primary care physician to prescribe his noncardiac medications and to see him for other medical problems. Therefore, he is here today to reestablish care here. He has no specific complaints or concerns to address today. He is fasting and is agreeable to update preventive care/physical.  He states that he is continued on the Paxil 40 mg and it has continued to work well. Anxiety is well controlled with this. Medication is causing no adverse effects. He states that he is still smoking about 1/2-1 pack per day. The only other updates to his medical history  that he has today are: I also discussed that he does have sleep apnea and uses CPAP. He says that he uses it every night but it really helps and has been on this since 1999. --Says that he sees Dr. Alfonso Ramus at [facility] occasionally for steroid injection to his knee. --Says that he has had laser treatment of varicose veins on each leg recently. Says that that has helped significantly No other updates today. No complaints or concerns to address today.  He works at [employer]. 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 Says that he does sit quite a bit but then also is up moving around quite a bit.  Review of Systems: Consitutional: No fever, chills, fatigue, night sweats, lymphadenopathy, or weight changes. Eyes: No visual changes, eye redness, or discharge. ENT/Mouth: Ears: No otalgia, tinnitus, hearing loss, discharge. Nose: No congestion, rhinorrhea, sinus pain, or epistaxis. Throat: No sore throat, post nasal drip, or teeth pain. Cardiovascular: No CP, palpitations, diaphoresis, DOE, edema, orthopnea, PND. Respiratory: No cough, hemoptysis, SOB, or wheezing. Gastrointestinal: No anorexia, dysphagia, reflux, pain, nausea, vomiting, hematemesis, diarrhea, constipation, BRBPR, or melena. Genitourinary: No dysuria, frequency, urgency, hematuria, incontinence, nocturia, decreased urinary stream, discharge, impotence, or testicular pain/masses. Musculoskeletal: No decreased ROM, myalgias, stiffness, joint swelling, or weakness. Skin: No rash, erythema, lesion changes, pain, warmth,  jaundice, or pruritis. Neurological: No headache, dizziness, syncope, seizures, tremors, memory loss, coordination problems, or paresthesias. Psychological: No anxiety, depression, hallucinations, SI/HI. Endocrine: No fatigue, polydipsia, polyphagia, polyuria, or known diabetes. All other systems were reviewed and are otherwise negative.  Past Medical History  Diagnosis Date  . Coronary artery disease   .  Hyperlipidemia   . Panic attacks     hx of  . Exogenous obesity   . History of sleep apnea   . Depression with anxiety     hx of  . Hypertension   . Arthritis of knee     seeing Dr. Alfonso Ramus  . Smoker unmotivated to quit   . Varicose veins      Past Surgical History  Procedure Laterality Date  . Cardiac catheterization  09/06/2008    stent to mid LAD and proximal LAD  . Polyps of lung  2008    removed by [facility]  . Colonoscopy N/A 05/01/2013    Procedure: COLONOSCOPY;  Surgeon: [name], MD;  [facility];  Service: Endoscopy;  Laterality: N/A;  8:30 AM  . Tonsillectomy      Home Meds:  Current Outpatient Prescriptions on File Prior to Visit  Medication Sig Dispense Refill  . aspirin 325 MG tablet Take 325 mg by mouth daily.      . clopidogrel (PLAVIX) 75 MG tablet take 1 tablet by mouth once daily 30 tablet 5  . furosemide (LASIX) 20 MG tablet Take 20 mg by mouth daily as needed for edema.    Marland Kitchen losartan (COZAAR) 100 MG tablet take 1 tablet by mouth once daily 30 tablet 5  . meclizine (ANTIVERT) 25 MG tablet Take 25 mg by mouth 2 (two) times daily as needed for dizziness.    . metoprolol succinate (TOPROL-XL) 25 MG 24 hr tablet Take 0.5 tablets (12.5 mg total) by mouth daily. 45 tablet 2  . nitroGLYCERIN (NITROSTAT) 0.4 MG SL tablet Place 1 tablet (0.4 mg total) under the tongue every 5 (five) minutes as needed for chest pain. 25 tablet prn  . PARoxetine (PAXIL) 40 MG tablet take 1 tablet by mouth once daily 30 tablet 5  . rosuvastatin (CRESTOR) 20 MG tablet Take 1 tablet (20 mg total) by mouth at bedtime. 90 tablet 3   No current facility-administered medications on file prior to visit.    Allergies: No Known Allergies  Social History   Social History  . Marital Status: Married    Spouse Name: N/A  . Number of Children: N/A  . Years of Education: N/A   Occupational History  . [employer] Work    Social History Main Topics  . Smoking  status: Current Every Day Smoker -- 1.00 packs/day for 40 years    Types: Cigarettes  . Smokeless tobacco: Never Used  . Alcohol Use: 0.0 oz/week    0 Standard drinks or equivalent per week     Comment: occasionally  . Drug Use: No  . Sexual Activity: Not on file   Other Topics Concern  . 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    Family History  Problem Relation Age of Onset  . Heart disease Father     had cabg  . Cancer Father 66    esophageal  . Cancer Brother     lung cancer  . Colon cancer Neg Hx     Physical Exam: Blood pressure 130/76, pulse 68,  temperature 98.3 F (36.8 C), temperature source Oral, resp. rate 18, height 6' (1.829 m), weight 319 lb (144.697 kg).  General: Obese WM. Appears in no acute distress. HEENT: Normocephalic, atraumatic. Conjunctiva pink, sclera non-icteric. Pupils 2 mm constricting to 1 mm, round, regular, and equally reactive to light and accomodation. EOMI. Internal auditory canal clear. TMs with good cone of light and without pathology. Nasal mucosa pink. Nares are without discharge. No sinus tenderness. Oral mucosa pink.  Pharynx without exudate.   Neck: Supple. Trachea midline. No thyromegaly. Full ROM. No lymphadenopathy. No carotid bruits. Lungs: Clear to auscultation bilaterally without wheezes, rales, or rhonchi. Breathing is of normal effort and unlabored. Cardiovascular: RRR with S1 S2. No murmurs, rubs, or gallops. Distal pulses 2+ symmetrically. No carotid or abdominal bruits. Lower legs with brawny changes and area of healing where he had laser treatment to varicose veins.  Abdomen: Soft, non-tender, non-distended with normoactive bowel sounds. No hepatosplenomegaly or masses. No rebound/guarding. No CVA tenderness. No hernias. Rectal: No external hemorrhoids or fissures. Rectal vault without masses. Prostate gland firm and smooth. No nodularity, tenderness, mass, or induration.   Musculoskeletal: Full range of motion and 5/5 strength throughout. His legs are obese. He has varicose veins. There is trace edema. Skin: Warm and moist without erythema, ecchymosis, wounds, or rash.He has varicose veins. There is trace edema. Lower legs with brawny changes and area of healing where he had laser treatment to varicose veins.  Neuro: A+Ox3. CN II-XII grossly intact. Moves all extremities spontaneously. Full sensation throughout. Normal gait. DTR 2+ throughout upper and lower extremities. Finger to nose intact. Psych:  Responds to questions appropriately with a normal affect.   Assessment/Plan:  60 y.o. y/o  male here for CPE  -1. Visit for preventive health examination A. Screening Labs: - CMET - FLP  - CBC with Differential - TSH - Vitamin D 25 hydroxy - PSA  B. Screening For Prostate Cancer; Prostate Exam nml. Check PSA.  C. Colorectal Cancer Screening:  He had screening colonoscopy 05/01/2013 by Dr. Barney Drain. Normal. Repeat 10 years.  D. Immunizations:   Tetanus: Given here 02/25/2013  Pneumovax 23: Given that he is an ongoing smoker he needs to have Pneumovax 23. Discussed this with him today and he is agreeable.--Given here 12/21/2015 No further pneumonia vaccine indicated until age 4. Will discuss Zostavax at age 39.  2. Panic attacks Stable/Controlled with Paxil .  3. Anxiety Stable/Controlled with current dose of Paxil  4. Ischemic heart disease Per Cardiology H/O stents x 3 -- 08/2008  5. Hypercholesterolemia Per Cardiology.   6. Smoker unmotivated to quit See HPI. He is well aware of risk that smoking places to his cardiovascular disease as well as cncer and othe rsystemic effects. He refuses medication to healp with this. Will cont to decrese on his own.  7. Exogenous obesity Does no exercise. Obviously is not compliant with diet. Again, he is well aware of effects of this-he has known CAD, HTN, HLD.   8. History of sleep apnea He is  compliant with using his CPAP every night.  9. Arthritis of knee Managed by Dr. Alfonso Ramus.   He can have follow-up office visit here in one year if things remain stable. Follow-up sooner if needed. He says he has been seeing Cardiology every 6 months. Now that he has a PCP established-- at his next visit with Cardiology-- he will discuss whether he can change to seeing them on an annual basis.  Signed:   Stanton Kidney  [place]is [place], [facility]  12/21/2015 11:39 AM

## 2015-12-21 NOTE — Addendum Note (Signed)
Addended by: Dena Billet on: 12/21/2015 11:59 AM   Modules accepted: Orders

## 2015-12-22 ENCOUNTER — Encounter: Payer: Self-pay | Admitting: Physician Assistant

## 2015-12-22 LAB — VITAMIN D 25 HYDROXY (VIT D DEFICIENCY, FRACTURES): VIT D 25 HYDROXY: 27 ng/mL — AB (ref 30–100)

## 2015-12-22 LAB — PSA: PSA: 0.66 ng/mL (ref <=4.00)

## 2015-12-27 ENCOUNTER — Encounter: Payer: Self-pay | Admitting: Family Medicine

## 2015-12-27 DIAGNOSIS — E559 Vitamin D deficiency, unspecified: Secondary | ICD-10-CM | POA: Insufficient documentation

## 2016-01-30 ENCOUNTER — Telehealth: Payer: Self-pay

## 2016-01-30 NOTE — Telephone Encounter (Signed)
Order faxed.

## 2016-01-30 NOTE — Telephone Encounter (Signed)
Okay to write order for CPAP and supplies

## 2016-01-30 NOTE — Telephone Encounter (Signed)
Patient sent a fax requesting a new order for CPAP machine.  He would like it sent to [name], [place] [address], [phone].

## 2016-03-06 ENCOUNTER — Other Ambulatory Visit: Payer: Self-pay

## 2016-03-12 MED ORDER — CLOPIDOGREL BISULFATE 75 MG PO TABS: 75.0000 mg | ORAL_TABLET | Freq: Every day | ORAL | Status: DC

## 2016-03-21 ENCOUNTER — Encounter: Payer: Self-pay | Admitting: Physician Assistant

## 2016-03-21 ENCOUNTER — Ambulatory Visit (INDEPENDENT_AMBULATORY_CARE_PROVIDER_SITE_OTHER): Payer: 59 | Admitting: Physician Assistant

## 2016-03-21 VITALS — BP 122/72 | HR 60 | Temp 97.7°F | Resp 18 | Wt 315.0 lb

## 2016-03-21 DIAGNOSIS — Z9989 Dependence on other enabling machines and devices: Secondary | ICD-10-CM

## 2016-03-21 DIAGNOSIS — G4733 Obstructive sleep apnea (adult) (pediatric): Secondary | ICD-10-CM

## 2016-03-21 NOTE — Progress Notes (Signed)
    Patient ID: [name] MRN: [id number], DOB: 08-07-1956, 60 y.o. Date of Encounter: 03/21/2016, 4:24 PM    Chief Complaint:  Chief Complaint  Patient presents with  . Other    has new CPAP and needs follow up office visit to document his use and response     HPI: 60 y.o. year old male presents for above.   He states that he was diagnosed with sleep apnea and put on CPAP many years ago. Says that he didn't realize that his machine had gotten so old but it was at least 58 years old so he has recently got a new machine. Arena is able to monitor his use of the machine and says that he uses it every night for 7 hours. Goes to sleep at 10 PM and wakes up at 5 AM. Says that he is also required to  have a visit with a provider for documentation as well. Therefore, here for visit today. Says he is compliant with using the CPAP every single night. Says that he has gotten to where he can't sleep without it and he feels so much better the next day when he uses it. Has no other complaints or concerns to address today.     Home Meds:   Outpatient Medications Prior to Visit  Medication Sig Dispense Refill  . aspirin 325 MG tablet Take 325 mg by mouth daily.      . clopidogrel (PLAVIX) 75 MG tablet Take 1 tablet (75 mg total) by mouth daily. 30 tablet 2  . furosemide (LASIX) 20 MG tablet Take 20 mg by mouth daily as needed for edema.    Marland Kitchen losartan (COZAAR) 100 MG tablet take 1 tablet by mouth once daily 30 tablet 5  . meclizine (ANTIVERT) 25 MG tablet Take 25 mg by mouth 2 (two) times daily as needed for dizziness.    . metoprolol succinate (TOPROL-XL) 25 MG 24 hr tablet Take 0.5 tablets (12.5 mg total) by mouth daily. 45 tablet 2  . nitroGLYCERIN (NITROSTAT) 0.4 MG SL tablet Place 1 tablet (0.4 mg total) under the tongue every 5 (five) minutes as needed for chest pain. 25 tablet prn  . PARoxetine (PAXIL) 40 MG tablet Take 1 tablet (40 mg total) by mouth daily. 90 tablet 2    . rosuvastatin (CRESTOR) 20 MG tablet Take 1 tablet (20 mg total) by mouth at bedtime. 90 tablet 3   No facility-administered medications prior to visit.     Allergies: No Known Allergies    Review of Systems: See HPI for pertinent ROS. All other ROS negative.    Physical Exam: Blood pressure 122/72, pulse 60, temperature 97.7 F (36.5 C), temperature source Oral, resp. rate 18, weight (!) 315 lb (142.9 kg)., Body mass index is 42.72 kg/m. General:  Obese WM. Appears in no acute distress. Neck: Supple. No thyromegaly. No lymphadenopathy. Lungs: Clear bilaterally to auscultation without wheezes, rales, or rhonchi. Breathing is unlabored. Heart: Regular rhythm. No murmurs, rubs, or gallops. Msk:  Strength and tone normal for age. Extremities/Skin: Warm and dry. Neuro: Alert and oriented X 3. Moves all extremities spontaneously. Gait is normal. CNII-XII grossly in tact. Psych:  Responds to questions appropriately with a normal affect.     ASSESSMENT AND PLAN:  60 y.o. year old male with  1. OSA on CPAP This is stable/controlled. Continue  CPAP.   Marin Olp [place], [place], [facility] 03/21/2016 4:24 PM

## 2016-03-22 ENCOUNTER — Other Ambulatory Visit: Payer: Self-pay

## 2016-03-22 MED ORDER — LOSARTAN POTASSIUM 100 MG PO TABS
100.0000 mg | ORAL_TABLET | Freq: Every day | ORAL | 1 refills | Status: DC
Start: 2016-03-22 — End: 2016-04-18

## 2016-04-04 ENCOUNTER — Encounter: Payer: Self-pay | Admitting: Cardiovascular Disease

## 2016-04-15 NOTE — Progress Notes (Signed)
Cardiology Office Note   Date:  04/18/2016   ID:  [name], [name] [birth year], MRN [id number]  PCP:  Karis Juba, PA-C  Cardiologist: Darlin Coco MD  Chief Complaint  Patient presents with  . Establish Care    no sx, former brackbill pt.  . Ischemic Heart Disease      History of Present Illness: [name] is a 60 y.o. male who presents for 6 month follow-up visit. Patient is new To me previously seen by Dr Mare Ferrari  He has a history of known ischemic heart disease. On 09/06/08 he underwent successful stenting of his mid LAD and proximal LAD by Dr. [name] with drug-eluting stents. He has done well since that time. He remains on dual antiplatelet therapy. He has not been having any bleeding problems. The patient has not been experiencing any exertional chest discomfort. Patient denies any dizzy spells or syncope. No palpitations. Blood pressure has been remaining stable on current medication. No headaches or dizziness. For his cholesterol the patient takes Crestor 20 mg daily. He is not having any myalgias. We are checking lab work today. The patient has a history of varicose veins.  Since last visit he has undergone endovascular ablation of his veins by Dr. [name].  He is very pleased with the results.  The right leg has been done and they are contemplating also doing the left leg.  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 Arthritis of knee    seeing Dr. Alfonso Ramus  . Coronary artery disease   . Depression with anxiety    hx of  . Exogenous obesity   . History of sleep apnea   . Hyperlipidemia   . Hypertension   . Panic attacks    hx of  . Smoker unmotivated to quit   . Varicose veins     Past Surgical History:  Procedure Laterality Date  . CARDIAC CATHETERIZATION  09/06/2008   stent to mid LAD and proximal LAD    . COLONOSCOPY N/A 05/01/2013   Procedure: COLONOSCOPY;  Surgeon: [name], MD;  Location: [facility];  Service: Endoscopy;  Laterality: N/A;  8:30 AM  . polyps of lung  2008   removed by [facility]  . TONSILLECTOMY       Current Outpatient Prescriptions  Medication Sig Dispense Refill  . aspirin 325 MG tablet Take 325 mg by mouth daily.      . clopidogrel (PLAVIX) 75 MG tablet Take 1 tablet (75 mg total) by mouth daily. 30 tablet 2  . furosemide (LASIX) 20 MG tablet Take 20 mg by mouth daily as needed for edema.    Marland Kitchen losartan (COZAAR) 100 MG tablet Take 1 tablet (100 mg total) by mouth daily. 30 tablet 1  . meclizine (ANTIVERT) 25 MG tablet Take 25 mg by mouth 2 (two) times daily as needed for dizziness.    . metoprolol succinate (TOPROL-XL) 25 MG 24 hr tablet Take 0.5 tablets (12.5 mg total) by mouth daily. 45 tablet 2  . nitroGLYCERIN (NITROSTAT) 0.4 MG SL tablet Place 1 tablet (0.4 mg total) under the tongue every 5 (five) minutes as needed for chest pain. 25 tablet prn  . PARoxetine (PAXIL) 40 MG tablet Take 1 tablet (40 mg total) by mouth daily. 90 tablet 2  . rosuvastatin (CRESTOR) 20 MG tablet  Take 1 tablet (20 mg total) by mouth at bedtime. 90 tablet 3   No current facility-administered medications for this visit.     Allergies:   Review of patient's allergies indicates no known allergies.    Social History:  The patient  reports that he has been smoking Cigarettes.  He has a 40.00 pack-year smoking history. He has never used smokeless tobacco. He reports that he drinks alcohol. He reports that he does not use drugs.   Family History:  The patient's family history includes Cancer in his brother; Cancer (age of onset: 27) in his father; Heart disease in his father.    ROS:  Please see the history of present illness.   Otherwise, review of systems are positive for none.   All other systems are reviewed and negative.    PHYSICAL EXAM: VS:  BP 120/60 (BP  Location: Left Arm, Patient Position: Sitting, Cuff Size: Large)   Pulse (!) 56   Ht '6\' 1"'$  (1.854 m)   Wt (!) 314 lb 6.4 oz (142.6 kg)   SpO2 93%   BMI 41.48 kg/m  , BMI Body mass index is 41.48 kg/m. Obese white male with baritone voice   HEENT: normal  Neck: no JVD, carotid bruits, or masses Cardiac: RRR; no murmurs, rubs, or gallops,no edema  Respiratory:  clear to auscultation bilaterally, normal work of breathing GI: soft, nontender, nondistended, + BS MS: no deformity or atrophy .  The patient is wearing support hose on the right leg Skin: warm and dry, no rash Neuro:  Strength and sensation are intact Psych: euthymic mood, full affect Varicosities LE;s with bilateral edema   EKG:   10/17/15  sinus bradycardia at 57 bpm.  Otherwise within normal limits   Recent Labs: 12/21/2015: ALT 22; BUN 15; Creat 0.73; Hemoglobin 15.8; Platelets 156; Potassium 4.3; Sodium 139; TSH 1.71    Lipid Panel    Component Value Date/Time   CHOL 133 12/21/2015 1118   TRIG 86 12/21/2015 1118   HDL 39 (L) 12/21/2015 1118   CHOLHDL 3.4 12/21/2015 1118   VLDL 17 12/21/2015 1118   LDLCALC 77 12/21/2015 1118      Wt Readings from Last 3 Encounters:  04/18/16 (!) 314 lb 6.4 oz (142.6 kg)  03/21/16 (!) 315 lb (142.9 kg)  12/21/15 (!) 319 lb (144.7 kg)        ASSESSMENT AND PLAN: 1. ischemic heart disease status post drug-eluting stent to mid LAD and proximal LAD on 09/06/08 for crescendo angina pectoris.  Possible need for left TKR with Dr Percell Miller  Will order exercise myovue for preop clearance  2. Hypercholesterolemia.  Tolerating Crestor.  No myalgias. Labs in 3 months LDL 77 normal LFTls  3. exogenous obesity.Weight is up 3 pounds.  Needs to work harder on diet. 4. Bilateral varicose veins.  Since last visit, successful surgery on right leg by Dr. [name] encouraged him to wear support hose    Current medicines are reviewed at length with the patient today.  The patient does not have  concerns regarding medicines.  The following changes have been made:  no change  Labs/ tests ordered today include:  No orders of the defined types were placed in this encounter.    Disposition: Continue current medication.  F/u with me in a year     [name]

## 2016-04-18 ENCOUNTER — Ambulatory Visit (INDEPENDENT_AMBULATORY_CARE_PROVIDER_SITE_OTHER): Payer: 59 | Admitting: Cardiovascular Disease

## 2016-04-18 ENCOUNTER — Encounter: Payer: Self-pay | Admitting: Cardiovascular Disease

## 2016-04-18 VITALS — BP 120/60 | HR 56 | Ht 73.0 in | Wt 314.4 lb

## 2016-04-18 DIAGNOSIS — Z7689 Persons encountering health services in other specified circumstances: Secondary | ICD-10-CM

## 2016-04-18 DIAGNOSIS — I259 Chronic ischemic heart disease, unspecified: Secondary | ICD-10-CM

## 2016-04-18 DIAGNOSIS — Z79899 Other long term (current) drug therapy: Secondary | ICD-10-CM | POA: Diagnosis not present

## 2016-04-18 DIAGNOSIS — Z7189 Other specified counseling: Secondary | ICD-10-CM

## 2016-04-18 MED ORDER — ROSUVASTATIN CALCIUM 20 MG PO TABS: 20.0000 mg | ORAL_TABLET | Freq: Every day | ORAL | 3 refills | Status: DC

## 2016-04-18 MED ORDER — LOSARTAN POTASSIUM 100 MG PO TABS: 100.0000 mg | ORAL_TABLET | Freq: Every day | ORAL | 3 refills | Status: DC

## 2016-04-18 NOTE — Patient Instructions (Addendum)
Medication Instructions:  Your physician recommends that you continue on your current medications as directed. Please refer to the Current Medication list given to you today.  Labwork: Your physician recommends that you return for lab work in: 2 months for lipid and liver panel.   Testing/Procedures: Your physician has requested that you have a lexiscan myoview. For further information please visit [email or web address]. Please follow instruction sheet, as given.  Follow-Up: Your physician wants you to follow-up in: 6 months with Dr. Johnsie Cancel. You will receive a reminder letter in the mail two months in advance. If you don't receive a letter, please call our office to schedule the follow-up appointment.   If you need a refill on your cardiac medications before your next appointment, please call your pharmacy.

## 2016-04-19 ENCOUNTER — Telehealth (HOSPITAL_COMMUNITY): Payer: Self-pay | Admitting: *Deleted

## 2016-04-19 NOTE — Telephone Encounter (Signed)
Left message per DPR detailed instructions per Myocardial Perfusion Study Information Sheet for the test on 04/23/16. Patient notified to arrive 15 minutes early and that it is imperative to arrive on time for appointment to keep from having the test rescheduled.  If you need to cancel or reschedule your appointment, please call the office within 24 hours of your appointment. Failure to do so may result in a cancellation of your appointment, and a $50 no show fee. Patient verbalized understanding. Hubbard Robinson, RN

## 2016-04-23 ENCOUNTER — Ambulatory Visit (HOSPITAL_COMMUNITY): Payer: 59 | Attending: Cardiology

## 2016-04-23 DIAGNOSIS — I259 Chronic ischemic heart disease, unspecified: Secondary | ICD-10-CM | POA: Diagnosis not present

## 2016-04-23 DIAGNOSIS — I2510 Atherosclerotic heart disease of native coronary artery without angina pectoris: Secondary | ICD-10-CM | POA: Diagnosis not present

## 2016-04-23 DIAGNOSIS — I10 Essential (primary) hypertension: Secondary | ICD-10-CM | POA: Insufficient documentation

## 2016-04-23 MED ORDER — REGADENOSON 0.4 MG/5ML IV SOLN
0.4000 mg | Freq: Once | INTRAVENOUS | Status: AC
  Administered 2016-04-23: 0.4 mg via INTRAVENOUS

## 2016-04-23 MED ORDER — TECHNETIUM TC 99M TETROFOSMIN IV KIT
32.7000 | PACK | Freq: Once | INTRAVENOUS | Status: AC | PRN
  Administered 2016-04-23: 32.7 via INTRAVENOUS
  Filled 2016-04-23: qty 33

## 2016-04-24 ENCOUNTER — Ambulatory Visit (HOSPITAL_COMMUNITY): Payer: 59 | Attending: Cardiology

## 2016-04-24 LAB — MYOCARDIAL PERFUSION IMAGING
CHL CUP NUCLEAR SDS: 2
CHL CUP NUCLEAR SSS: 3
CSEPPHR: 80 {beats}/min
LHR: 0.43
LV sys vol: 93 mL
LVDIAVOL: 160 mL (ref 62–150)
Rest HR: 63 {beats}/min
SRS: 1
TID: 1.19

## 2016-04-24 MED ORDER — TECHNETIUM TC 99M TETROFOSMIN IV KIT
31.5000 | PACK | Freq: Once | INTRAVENOUS | Status: AC | PRN
  Administered 2016-04-24: 32 via INTRAVENOUS
  Filled 2016-04-24: qty 32

## 2016-06-18 ENCOUNTER — Other Ambulatory Visit: Payer: 59 | Admitting: *Deleted

## 2016-06-18 DIAGNOSIS — Z79899 Other long term (current) drug therapy: Secondary | ICD-10-CM

## 2016-06-18 LAB — HEPATIC FUNCTION PANEL
ALBUMIN: 4.1 g/dL (ref 3.6–5.1)
ALK PHOS: 56 U/L (ref 40–115)
ALT: 17 U/L (ref 9–46)
AST: 14 U/L (ref 10–35)
BILIRUBIN TOTAL: 0.4 mg/dL (ref 0.2–1.2)
Bilirubin, Direct: 0.1 mg/dL (ref <=0.2)
Indirect Bilirubin: 0.3 mg/dL (ref 0.2–1.2)
TOTAL PROTEIN: 6.3 g/dL (ref 6.1–8.1)

## 2016-06-18 LAB — LIPID PANEL
CHOL/HDL RATIO: 7.9 ratio — AB (ref <=5.0)
Cholesterol: 205 mg/dL — ABNORMAL HIGH (ref 125–200)
HDL: 26 mg/dL — AB (ref >=40)
LDL CALC: 149 mg/dL — AB (ref <130)
TRIGLYCERIDES: 152 mg/dL — AB (ref <150)
VLDL: 30 mg/dL (ref <30)

## 2016-06-25 ENCOUNTER — Other Ambulatory Visit: Payer: Self-pay | Admitting: Cardiovascular Disease

## 2016-06-26 ENCOUNTER — Telehealth: Payer: Self-pay

## 2016-06-26 ENCOUNTER — Telehealth: Payer: Self-pay | Admitting: Pharmacist

## 2016-06-26 DIAGNOSIS — E785 Hyperlipidemia, unspecified: Secondary | ICD-10-CM

## 2016-06-26 DIAGNOSIS — E7800 Pure hypercholesterolemia, unspecified: Secondary | ICD-10-CM

## 2016-06-26 MED ORDER — EZETIMIBE 10 MG PO TABS: 10.0000 mg | ORAL_TABLET | Freq: Every day | ORAL | 3 refills | Status: DC

## 2016-06-26 MED ORDER — ROSUVASTATIN CALCIUM 10 MG PO TABS: 10.0000 mg | ORAL_TABLET | Freq: Every day | ORAL | 11 refills | Status: DC

## 2016-06-26 NOTE — Telephone Encounter (Signed)
Spoke with pt regarding lipid panel and upcoming lipid appt. He states that he stopped taking his Crestor in May because it was causing foot and ankle pain. His pain resolved upon discontinuing the Crestor. He has previously experienced myalgias with Lipitor as well. He is willing to retry a lower dose of Crestor '10mg'$  daily. Advised him that if he experiences muscle pain on the lower dose that he can alternate days. He will still pick up his Zetia. Plan to recheck lipid panel on December 8th 1 week before lipid visit.

## 2016-06-26 NOTE — Telephone Encounter (Signed)
-----   Message from Josue Hector, MD sent at 06/19/2016 11:05 AM EDT ----- LDL still too high for CAD add zetia 10 mg and f/u lipid clinic in 6 weeks

## 2016-06-26 NOTE — Telephone Encounter (Signed)
Left detailed message of lipid results and for patient to call back.. Will send order for zetia to patient's pharmacy.

## 2016-06-26 NOTE — Addendum Note (Signed)
Addended by: SUPPLE, MEGAN E on: 06/26/2016 12:51 PM   Modules accepted: Orders

## 2016-06-27 ENCOUNTER — Other Ambulatory Visit: Payer: Self-pay | Admitting: Cardiovascular Disease

## 2016-06-27 NOTE — Telephone Encounter (Signed)
clopidogrel (PLAVIX) 75 MG tablet  Medication  Date: 06/25/2016 Department: [place] Ordering/Authorizing: Josue Hector, MD  Order Providers   Prescribing Provider Encounter Provider  Josue Hector, MD Josue Hector, MD  Medication Detail    Disp Refills Start End   clopidogrel (PLAVIX) 75 MG tablet 90 tablet 3 06/25/2016    Sig: take 1 tablet by mouth once daily   E-Prescribing Status: Receipt confirmed by pharmacy (06/25/2016 2:42 PM EDT)   Pharmacy   [facility], [place]

## 2016-06-27 NOTE — Telephone Encounter (Signed)
Patient has appointment with lipid clinic on 08/10/16.

## 2016-08-03 ENCOUNTER — Other Ambulatory Visit: Payer: 59 | Admitting: *Deleted

## 2016-08-03 DIAGNOSIS — E785 Hyperlipidemia, unspecified: Secondary | ICD-10-CM

## 2016-08-03 LAB — HEPATIC FUNCTION PANEL
ALT: 23 U/L (ref 9–46)
AST: 17 U/L (ref 10–35)
Albumin: 4.3 g/dL (ref 3.6–5.1)
Alkaline Phosphatase: 56 U/L (ref 40–115)
BILIRUBIN DIRECT: 0.1 mg/dL (ref <=0.2)
BILIRUBIN INDIRECT: 0.4 mg/dL (ref 0.2–1.2)
BILIRUBIN TOTAL: 0.5 mg/dL (ref 0.2–1.2)
Total Protein: 6.6 g/dL (ref 6.1–8.1)

## 2016-08-03 LAB — LIPID PANEL
CHOL/HDL RATIO: 3.7 ratio (ref <5.0)
CHOLESTEROL: 116 mg/dL (ref <200)
HDL: 31 mg/dL — ABNORMAL LOW (ref >40)
LDL CALC: 68 mg/dL (ref <100)
Triglycerides: 85 mg/dL (ref <150)
VLDL: 17 mg/dL (ref <30)

## 2016-08-10 ENCOUNTER — Ambulatory Visit: Payer: 59

## 2016-09-10 DIAGNOSIS — G4733 Obstructive sleep apnea (adult) (pediatric): Secondary | ICD-10-CM | POA: Diagnosis not present

## 2016-09-17 ENCOUNTER — Other Ambulatory Visit: Payer: Self-pay | Admitting: Physician Assistant

## 2016-09-18 NOTE — Telephone Encounter (Signed)
Rx filled per protocol  

## 2016-09-26 ENCOUNTER — Other Ambulatory Visit: Payer: Self-pay | Admitting: Physician Assistant

## 2016-09-26 NOTE — Telephone Encounter (Signed)
Rx filled per protocol  

## 2016-09-28 ENCOUNTER — Ambulatory Visit (INDEPENDENT_AMBULATORY_CARE_PROVIDER_SITE_OTHER): Payer: 59 | Admitting: Family Medicine

## 2016-09-28 ENCOUNTER — Encounter: Payer: Self-pay | Admitting: Family Medicine

## 2016-09-28 VITALS — BP 126/62 | HR 66 | Temp 100.5°F | Resp 18 | Ht 73.0 in | Wt 317.0 lb

## 2016-09-28 DIAGNOSIS — J111 Influenza due to unidentified influenza virus with other respiratory manifestations: Secondary | ICD-10-CM

## 2016-09-28 MED ORDER — GUAIFENESIN-CODEINE 100-10 MG/5ML PO SOLN: 10.0000 mL | Freq: Four times a day (QID) | ORAL | 0 refills | Status: DC | PRN

## 2016-09-28 MED ORDER — OSELTAMIVIR PHOSPHATE 75 MG PO CAPS: 75.0000 mg | ORAL_CAPSULE | Freq: Two times a day (BID) | ORAL | 0 refills | Status: DC

## 2016-09-28 NOTE — Progress Notes (Signed)
   Subjective:    Patient ID: [name], male    DOB: [birth year], 61 y.o.   MRN: [id number]  Patient presents for Illness (x2 days- nonproductive cough, fever, HA, muscle aches, malaise, fatigue)  Patient here with nonproductive cough fever or headache muscle aches,fatigue  over the past 2 days Body aches started last night along with chills. He has not had a flu shot. He smokes daily. He has underlying heart disease and currently on Plavix.  Took some aleve las tnight for sinus headache  Multiple sick contacts positive with flu  Review Of Systems:  GEN- + fatigue, +fever, weight loss,weakness, recent illness HEENT- denies eye drainage, change in vision,+ nasal discharge, CVS- denies chest pain, palpitations RESP- denies SOB, +cough, wheeze ABD- denies N/V, change in stools, abd pain GU- denies dysuria, hematuria, dribbling, incontinence MSK- denies joint pain, +muscle aches, injury Neuro- denies headache, dizziness, syncope, seizure activity       Objective:    BP 126/62 (BP Location: Left Arm, Patient Position: Sitting, Cuff Size: Large)   Pulse 66   Temp (!) 100.5 F (38.1 C) (Oral)   Resp 18   Ht '6\' 1"'$  (1.854 m)   Wt (!) 317 lb (143.8 kg)   SpO2 97%   BMI 41.82 kg/m  GEN- NAD, alert and oriented x3 HEENT- PERRL, EOMI, non injected sclera, pink conjunctiva, MMM, oropharynx clear,nares clear rhinorrhea  Neck- Supple, shotty LAD CVS- RRR, no murmur RESP-CTAB EXT- No edema Pulses- Radial  2+        Assessment & Plan:      Problem List Items Addressed This Visit    None    Visit Diagnoses    Influenza    -  Primary   multiple sick contacts, has CAD, smoker, we do not have flu test available, based on symptoms influenza treat tamiflu, tylenol for fever, robitussin codiene    Relevant Medications   oseltamivir (TAMIFLU) 75 MG capsule      Note: This dictation was prepared with Dragon dictation along with smaller phrase technology. Any transcriptional  errors that result from this process are unintentional.

## 2016-09-28 NOTE — Patient Instructions (Signed)
Tamiflu Cough medicine sent F/U a needed

## 2016-10-11 DIAGNOSIS — G4733 Obstructive sleep apnea (adult) (pediatric): Secondary | ICD-10-CM | POA: Diagnosis not present

## 2016-10-12 ENCOUNTER — Encounter: Payer: Self-pay | Admitting: Cardiovascular Disease

## 2016-10-12 NOTE — Progress Notes (Signed)
Cardiology Office Note   Date:  10/22/2016   ID:  [name], [name] Feb 24, 1956, MRN [id number]  PCP:  Karis Juba, PA-C  Cardiologist: Darlin Coco MD  Chief Complaint  Patient presents with  . Ischemic heart disease  . Medication Managment      History of Present Illness: [name] is a 61 y.o. male who presents for  follow-up visit. Previously seen by Dr Mare Ferrari  He has a history of known ischemic heart disease. On 09/06/08 he underwent successful stenting of his mid LAD and proximal LAD by Dr. [name] with drug-eluting stents. He has done well since that time. He remains on dual antiplatelet therapy. Myovue done 04/24/16 non ischemic  He has not been having any bleeding problems. The patient has not been experiencing any exertional chest discomfort. Patient denies any dizzy spells or syncope. No palpitations. Blood pressure has been remaining stable on current medication. No headaches or dizziness.  Has myalgias with lipitor and higher dose crestor seeing lipid clinic   The patient has a history of varicose veins.  Since last visit he has undergone endovascular ablation of his veins by Dr. [name].  He is very pleased with the results.  The right leg has been done and they are contemplating also doing the left leg.   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 Arthritis of knee    seeing Dr. Alfonso Ramus  . Coronary artery disease   . Depression with anxiety    hx of  . Exogenous obesity   . History of sleep apnea   . Hyperlipidemia   . Hypertension   . Panic attacks    hx of  . Smoker unmotivated to quit   . Varicose veins     Past Surgical History:  Procedure Laterality Date  . CARDIAC CATHETERIZATION  09/06/2008   stent to mid LAD and proximal LAD  . COLONOSCOPY N/A 05/01/2013   Procedure: COLONOSCOPY;  Surgeon: [name], MD;  Location: [facility];  Service: Endoscopy;  Laterality: N/A;   8:30 AM  . polyps of lung  2008   removed by [facility]  . TONSILLECTOMY       Current Outpatient Prescriptions  Medication Sig Dispense Refill  . aspirin 325 MG tablet Take 325 mg by mouth daily.      . clopidogrel (PLAVIX) 75 MG tablet take 1 tablet by mouth once daily 90 tablet 3  . furosemide (LASIX) 20 MG tablet Take 20 mg by mouth daily as needed for edema.    Marland Kitchen losartan (COZAAR) 100 MG tablet Take 1 tablet (100 mg total) by mouth daily. 90 tablet 3  . metoprolol succinate (TOPROL-XL) 25 MG 24 hr tablet Take 0.5 tablets (12.5 mg total) by mouth daily. 45 tablet 2  . nitroGLYCERIN (NITROSTAT) 0.4 MG SL tablet Place 1 tablet (0.4 mg total) under the tongue every 5 (five) minutes as needed for chest pain. 25 tablet prn  . PARoxetine (PAXIL) 40 MG tablet take 1 tablet by mouth once daily 90 tablet 1  . ezetimibe (ZETIA) 10 MG tablet Take 1 tablet (10 mg total) by mouth daily. 90 tablet 3  . hydrochlorothiazide (MICROZIDE) 12.5 MG capsule Take 1 capsule (12.5 mg total) by mouth daily as needed (for swelling). 30 capsule 1   No current facility-administered medications for this visit.     Allergies:   Patient has no known allergies.  Social History:  The patient  reports that he has been smoking Cigarettes.  He has a 40.00 pack-year smoking history. He has never used smokeless tobacco. He reports that he drinks alcohol. He reports that he does not use drugs.   Family History:  The patient's family history includes Cancer in his brother; Cancer (age of onset: 70) in his father; Heart disease in his father.    ROS:  Please see the history of present illness.   Otherwise, review of systems are positive for none.   All other systems are reviewed and negative.    PHYSICAL EXAM: VS:  BP 136/74   Pulse 75   Ht '6\' 1"'$  (1.854 m)   Wt (!) 317 lb 6.4 oz (144 kg)   SpO2 96%   BMI 41.88 kg/m  , BMI Body mass index is 41.88 kg/m. Obese white male with baritone voice   HEENT:  normal  Neck: no JVD, carotid bruits, or masses Cardiac: RRR; no murmurs, rubs, or gallops,no edema  Respiratory:  clear to auscultation bilaterally, normal work of breathing GI: soft, nontender, nondistended, + BS MS: no deformity or atrophy .  The patient is wearing support hose on the right leg Skin: warm and dry, no rash Neuro:  Strength and sensation are intact Psych: euthymic mood, full affect Varicosities LE;s with bilateral edema   EKG:   10/17/15  sinus bradycardia at 57 bpm.  Otherwise within normal limits 10/22/16  SR rate 68 normal T inversion 3 with inverted QRS   Recent Labs: 12/21/2015: BUN 15; Creat 0.73; Hemoglobin 15.8; Platelets 156; Potassium 4.3; Sodium 139; TSH 1.71 08/03/2016: ALT 23    Lipid Panel    Component Value Date/Time   CHOL 116 08/03/2016 0738   TRIG 85 08/03/2016 0738   HDL 31 (L) 08/03/2016 0738   CHOLHDL 3.7 08/03/2016 0738   VLDL 17 08/03/2016 0738   LDLCALC 68 08/03/2016 0738      Wt Readings from Last 3 Encounters:  10/22/16 (!) 317 lb 6.4 oz (144 kg)  09/28/16 (!) 317 lb (143.8 kg)  04/23/16 (!) 314 lb (142.4 kg)        ASSESSMENT AND PLAN: 1. ischemic heart disease status post drug-eluting stent to mid LAD and proximal LAD on 09/06/08 for crescendo angina pectoris.   Myovue 04/24/16 low risk no ischemia EF 48%  2. Hypercholesterolemia.  Stop crestor start zetia labs in 3 months myalgias with lipitor and crestor  3. exogenous obesity.Weight is up 3 pounds.  Needs to work harder on diet. 4. Bilateral varicose veins.  Since last visit, successful surgery on right leg by Dr. [name] encouraged him to wear support hose  PRN HCTZ called in  5. Ortho: post arthroscopic surgery on left with improvement f/u Dr Alain Marion   Current medicines are reviewed at length with the patient today.  The patient does not have concerns regarding medicines.  The following changes have been made:  Zetia started d/c crestor PRN HCTZ    Labs/ tests ordered  today include:  Lipid and liver in 3 months   Orders Placed This Encounter  Procedures  . Hepatic function panel  . Lipid panel  . EKG 12-Lead     Disposition: Continue current medication.  F/u with me in a year     [name]

## 2016-10-22 ENCOUNTER — Ambulatory Visit (INDEPENDENT_AMBULATORY_CARE_PROVIDER_SITE_OTHER): Payer: 59 | Admitting: Cardiovascular Disease

## 2016-10-22 ENCOUNTER — Encounter: Payer: Self-pay | Admitting: Cardiovascular Disease

## 2016-10-22 VITALS — BP 136/74 | HR 75 | Ht 73.0 in | Wt 317.4 lb

## 2016-10-22 DIAGNOSIS — I259 Chronic ischemic heart disease, unspecified: Secondary | ICD-10-CM

## 2016-10-22 DIAGNOSIS — Z79899 Other long term (current) drug therapy: Secondary | ICD-10-CM | POA: Diagnosis not present

## 2016-10-22 MED ORDER — EZETIMIBE 10 MG PO TABS: 10.0000 mg | ORAL_TABLET | Freq: Every day | ORAL | 3 refills | Status: DC

## 2016-10-22 MED ORDER — HYDROCHLOROTHIAZIDE 12.5 MG PO CAPS: 12.5000 mg | ORAL_CAPSULE | Freq: Every day | ORAL | 1 refills | Status: DC | PRN

## 2016-10-22 NOTE — Patient Instructions (Addendum)
Medication Instructions:  Your physician has recommended you make the following change in your medication:  1-START Zetia 10 mg by mouth daily 2-TAKE hydrochlorothiazide 12.5 mg by mouth as needed for swelling.  Labwork: Your physician recommends that you return for lab work in: 3 months Lipid and Liver Panel  Testing/Procedures: NONE  Follow-Up: Your physician wants you to follow-up in: 6 months with Dr. Johnsie Cancel. You will receive a reminder letter in the mail two months in advance. If you don't receive a letter, please call our office to schedule the follow-up appointment.   If you need a refill on your cardiac medications before your next appointment, please call your pharmacy.

## 2016-10-25 ENCOUNTER — Other Ambulatory Visit: Payer: Self-pay | Admitting: Cardiovascular Disease

## 2016-11-08 DIAGNOSIS — G4733 Obstructive sleep apnea (adult) (pediatric): Secondary | ICD-10-CM | POA: Diagnosis not present

## 2016-12-12 DIAGNOSIS — M170 Bilateral primary osteoarthritis of knee: Secondary | ICD-10-CM | POA: Diagnosis not present

## 2016-12-25 ENCOUNTER — Other Ambulatory Visit: Payer: Self-pay | Admitting: Cardiovascular Disease

## 2017-01-22 ENCOUNTER — Other Ambulatory Visit: Payer: 59

## 2017-01-22 DIAGNOSIS — Z79899 Other long term (current) drug therapy: Secondary | ICD-10-CM | POA: Diagnosis not present

## 2017-01-22 LAB — LIPID PANEL
CHOL/HDL RATIO: 5.7 ratio — AB (ref 0.0–5.0)
Cholesterol, Total: 178 mg/dL (ref 100–199)
HDL: 31 mg/dL — AB (ref >39)
LDL CALC: 120 mg/dL — AB (ref 0–99)
TRIGLYCERIDES: 137 mg/dL (ref 0–149)
VLDL CHOLESTEROL CAL: 27 mg/dL (ref 5–40)

## 2017-01-22 LAB — HEPATIC FUNCTION PANEL
ALBUMIN: 4.2 g/dL (ref 3.5–5.5)
ALT: 19 IU/L (ref 0–44)
AST: 14 IU/L (ref 0–40)
Alkaline Phosphatase: 67 IU/L (ref 39–117)
Bilirubin Total: 0.4 mg/dL (ref 0.0–1.2)
Bilirubin, Direct: 0.11 mg/dL (ref 0.00–0.40)
TOTAL PROTEIN: 6.5 g/dL (ref 6.0–8.5)

## 2017-02-07 ENCOUNTER — Ambulatory Visit (INDEPENDENT_AMBULATORY_CARE_PROVIDER_SITE_OTHER): Payer: 59 | Admitting: Pharmacist

## 2017-02-07 VITALS — BP 138/82 | HR 60 | Ht 73.0 in | Wt 304.5 lb

## 2017-02-07 DIAGNOSIS — E7800 Pure hypercholesterolemia, unspecified: Secondary | ICD-10-CM

## 2017-02-07 NOTE — Progress Notes (Addendum)
Patient ID: [name]                 DOB: Oct 25, 1955                    MRN: [id number]     HPI: [name] is a 61 y.o. male patient referred to lipid clinic by Dr Johnsie Cancel. PMH is significant for CAD s/p stenting of his mid LAD and proximal LAD, obesity, and HTN. Pt has a history of statin intolerance and presents to clinic for further management.  Pt reports tolerating Zetia well for the past 3 months. He has a history of statin intolerance to Crestor 10mg  and 20mg  daily as well as Lipitor 10mg  daily/every other day and 20mg  daily. He experienced severe foot, calf, and ankle pain that "tore up his legs and feet." His myalgias disappeared after statin discontinuation in each case.  He has been working on limiting his portions during meal time. He has lost 13 lbs as a result of this over the past 4 months. His activity level is limited by arthritis pain at baseline.  Current Medications: Zetia 10mg  daily Intolerances: Crestor 10mg  and 20mg  daily, Lipitor 10mg  and 20mg  daily, 10mg  every other day - myalgias, niacin - flushing Risk Factors: CAD s/p PCI, obesity LDL goal: 70mg /dL  Diet: Likes meat and bread - chicken, pork, deli ham and [place]. Likes seafood. Has been drinking more water.  Exercise: Arthritis in his legs limits activity.  Family History: The patient's family history includes Cancer in his brother; Cancer (age of onset: 91) in his father; Heart disease in his father.   Social History: The patient  reports that he has been smoking Cigarettes.  He has a 40.00 pack-year smoking history. He has never used smokeless tobacco. He reports that he drinks alcohol. He reports that he does not use drugs.   Labs: 01/22/2017: TC 178, TG 137, HDL 31, LDL 120 (Zetia 10mg  daily) 08/03/2016: TC 116, TG 85, HDL 31, LDL 68 (Crestor 10mg  and Zetia 10mg )   Past Medical History:  Diagnosis Date  . Arthritis of knee    seeing Dr. Alfonso Ramus  . Coronary artery disease   . Depression  with anxiety    hx of  . Exogenous obesity   . History of sleep apnea   . Hyperlipidemia   . Hypertension   . Panic attacks    hx of  . Smoker unmotivated to quit   . Varicose veins     Current Outpatient Prescriptions on File Prior to Visit  Medication Sig Dispense Refill  . aspirin 325 MG tablet Take 325 mg by mouth daily.      . clopidogrel (PLAVIX) 75 MG tablet take 1 tablet by mouth once daily 90 tablet 3  . ezetimibe (ZETIA) 10 MG tablet Take 1 tablet (10 mg total) by mouth daily. 90 tablet 3  . furosemide (LASIX) 20 MG tablet Take 20 mg by mouth daily as needed for edema.    . hydrochlorothiazide (MICROZIDE) 12.5 MG capsule Take 1 capsule by mouth daily as needed for swelling. 30 capsule 10  . losartan (COZAAR) 100 MG tablet Take 1 tablet (100 mg total) by mouth daily. 90 tablet 3  . metoprolol succinate (TOPROL-XL) 25 MG 24 hr tablet take 1/2 tablet by mouth once daily 45 tablet 3  . nitroGLYCERIN (NITROSTAT) 0.4 MG SL tablet Place 1 tablet (0.4 mg total) under the tongue every 5 (five) minutes as needed for chest pain.  25 tablet prn  . PARoxetine (PAXIL) 40 MG tablet take 1 tablet by mouth once daily 90 tablet 1   No current facility-administered medications on file prior to visit.     No Known Allergies  Assessment/Plan:  1. Hyperlipidemia - LDL is currently 122mg /dL on Zetia 10mg  daily above goal 70mg /dL given ASCVD. Pt is intolerant to low doses of statins including Crestor 10mg  daily as well as Lipitor 10mg  every other day. PCSK9i is the only option to bring LDL to goal. Discussed expected benefits, side effects, and injection technique of PCSK9i. Will submit prior authorization for Praluent.   Megan E. Supple, PharmD, CPP, [facility] [address], [place],  [address] Phone: [phone]; Fax: [phone] 02/07/2017 4:13 PM

## 2017-02-07 NOTE — Patient Instructions (Signed)
It was nice to meet you today  I will start the paperwork process for Praluent to see if your insurance company will cover it  I will be in touch by phone, call Jinny Blossom in clinic with any concerns [phone]

## 2017-02-12 ENCOUNTER — Telehealth: Payer: Self-pay | Admitting: Pharmacist

## 2017-02-12 MED ORDER — ALIROCUMAB 75 MG/ML ~~LOC~~ SOPN: 1.0000 "pen " | PEN_INJECTOR | SUBCUTANEOUS | 11 refills | Status: DC

## 2017-02-12 NOTE — Telephone Encounter (Signed)
Pt approved for Praluent therapy. Rx sent to [facility], pt qualifies for copay card for free therapy. Pt is aware and will call clinic once he receives his first shipment.

## 2017-03-19 ENCOUNTER — Ambulatory Visit (INDEPENDENT_AMBULATORY_CARE_PROVIDER_SITE_OTHER): Payer: 59 | Admitting: Family Medicine

## 2017-03-19 ENCOUNTER — Encounter: Payer: Self-pay | Admitting: Family Medicine

## 2017-03-19 VITALS — BP 122/66 | HR 68 | Temp 98.0°F | Resp 18 | Ht 73.0 in | Wt 301.0 lb

## 2017-03-19 DIAGNOSIS — J209 Acute bronchitis, unspecified: Secondary | ICD-10-CM

## 2017-03-19 MED ORDER — PREDNISONE 20 MG PO TABS: ORAL_TABLET | ORAL | 0 refills | Status: DC

## 2017-03-19 MED ORDER — AZITHROMYCIN 250 MG PO TABS: ORAL_TABLET | ORAL | 0 refills | Status: DC

## 2017-03-19 NOTE — Progress Notes (Signed)
Subjective:    Patient ID: [name], male    DOB: Dec 31, 1955, 61 y.o.   MRN: [id number]  HPI  Patient has been sick for more than 2 weeks. Infection was originally brought home by his daughter had an upper respiratory infection. This is been steadily worsening for the last 2 weeks. He reports head congestion, sinus pressure, sinus pain, postnasal drip. However his biggest concern is worsening shortness of breath, worsening cough, and he has already wheezing today on exam. Listing to the patient, he has diminished breath sounds bilaterally and faintly toward wheezing Past Medical History:  Diagnosis Date  . Arthritis of knee    seeing Dr. Alfonso Ramus  . Coronary artery disease   . Depression with anxiety    hx of  . Exogenous obesity   . History of sleep apnea   . Hyperlipidemia   . Hypertension   . Panic attacks    hx of  . Smoker unmotivated to quit   . Varicose veins    Past Surgical History:  Procedure Laterality Date  . CARDIAC CATHETERIZATION  09/06/2008   stent to mid LAD and proximal LAD  . COLONOSCOPY N/A 05/01/2013   Procedure: COLONOSCOPY;  Surgeon: [name], MD;  Location: [facility];  Service: Endoscopy;  Laterality: N/A;  8:30 AM  . polyps of lung  2008   removed by [facility]  . TONSILLECTOMY     Current Outpatient Prescriptions on File Prior to Visit  Medication Sig Dispense Refill  . Alirocumab (PRALUENT) 75 MG/ML SOPN Inject 1 pen into the skin every 14 (fourteen) days. 2 pen 11  . aspirin EC 81 MG tablet Take 81 mg by mouth daily.    . clopidogrel (PLAVIX) 75 MG tablet take 1 tablet by mouth once daily 90 tablet 3  . ezetimibe (ZETIA) 10 MG tablet Take 1 tablet (10 mg total) by mouth daily. 90 tablet 3  . furosemide (LASIX) 20 MG tablet Take 20 mg by mouth daily as needed for edema.    . hydrochlorothiazide (MICROZIDE) 12.5 MG capsule Take 1 capsule by mouth daily as needed for swelling. 30 capsule 10  . losartan (COZAAR) 100 MG tablet Take  1 tablet (100 mg total) by mouth daily. 90 tablet 3  . metoprolol succinate (TOPROL-XL) 25 MG 24 hr tablet take 1/2 tablet by mouth once daily 45 tablet 3  . nitroGLYCERIN (NITROSTAT) 0.4 MG SL tablet Place 1 tablet (0.4 mg total) under the tongue every 5 (five) minutes as needed for chest pain. 25 tablet prn  . PARoxetine (PAXIL) 40 MG tablet take 1 tablet by mouth once daily 90 tablet 1   No current facility-administered medications on file prior to visit.    No Known Allergies Social History   Social History  . Marital status: Married    Spouse name: N/A  . Number of children: N/A  . Years of education: N/A   Occupational History  . [employer] Work    Social History Main Topics  . Smoking status: Current Every Day Smoker    Packs/day: 1.00    Years: 40.00    Types: Cigarettes  . Smokeless tobacco: Never Used  . Alcohol use 0.0 oz/week     Comment: occasionally  . Drug use: No  . Sexual activity: Not on file   Other Topics Concern  . 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  Review of Systems     Objective:   Physical Exam  Constitutional: He appears well-developed and well-nourished. No distress.  HENT:  Right Ear: Tympanic membrane and ear canal normal.  Left Ear: Tympanic membrane and ear canal normal.  Nose: Mucosal edema and rhinorrhea present.  Mouth/Throat: Oropharynx is clear and moist. No oropharyngeal exudate.  Neck: Neck supple.  Cardiovascular: Normal rate, regular rhythm and normal heart sounds.   No murmur heard. Pulmonary/Chest: Effort normal. No respiratory distress. He has decreased breath sounds. He has wheezes. He has no rales. He exhibits no tenderness.  Lymphadenopathy:    He has no cervical adenopathy.  Skin: He is not diaphoretic.  Vitals reviewed.         Assessment & Plan:  Acute bronchitis, unspecified organism - Plan: predniSONE (DELTASONE) 20 MG tablet, azithromycin  (ZITHROMAX) 250 MG tablet  I believe the patient has bronchitis triggered by sinusitis coupled with underlying lung damage from smoking. Cautioned the patient that he needs to quit smoking. Discussed Chantix. Meanwhile start the patient on prednisone taper pack for the bronchospasm and wheezing in addition to a Z-Pak for bronchitis and sinusitis.

## 2017-04-10 ENCOUNTER — Other Ambulatory Visit: Payer: Self-pay | Admitting: Physician Assistant

## 2017-05-03 ENCOUNTER — Telehealth: Payer: Self-pay | Admitting: Pharmacist

## 2017-05-03 NOTE — Telephone Encounter (Signed)
Pt was approved for Praluent in June. [facility] 8/7 and again today for pt to return call. Unsure if pt has received injections and has started on Praluent. He needs f/u lab work scheduled to assess efficacy of Praluent.

## 2017-05-07 ENCOUNTER — Other Ambulatory Visit: Payer: Self-pay | Admitting: Cardiovascular Disease

## 2017-05-20 ENCOUNTER — Ambulatory Visit (INDEPENDENT_AMBULATORY_CARE_PROVIDER_SITE_OTHER): Payer: 59 | Admitting: Physician Assistant

## 2017-05-20 ENCOUNTER — Encounter: Payer: Self-pay | Admitting: Physician Assistant

## 2017-05-20 VITALS — BP 142/72 | HR 54 | Temp 97.5°F | Resp 16 | Ht 73.0 in | Wt 318.0 lb

## 2017-05-20 DIAGNOSIS — E7800 Pure hypercholesterolemia, unspecified: Secondary | ICD-10-CM

## 2017-05-20 DIAGNOSIS — E6609 Other obesity due to excess calories: Secondary | ICD-10-CM

## 2017-05-20 DIAGNOSIS — I259 Chronic ischemic heart disease, unspecified: Secondary | ICD-10-CM

## 2017-05-20 DIAGNOSIS — Z8669 Personal history of other diseases of the nervous system and sense organs: Secondary | ICD-10-CM

## 2017-05-20 DIAGNOSIS — Z1283 Encounter for screening for malignant neoplasm of skin: Secondary | ICD-10-CM

## 2017-05-20 DIAGNOSIS — I10 Essential (primary) hypertension: Secondary | ICD-10-CM | POA: Diagnosis not present

## 2017-05-20 DIAGNOSIS — F410 Panic disorder [episodic paroxysmal anxiety] without agoraphobia: Secondary | ICD-10-CM

## 2017-05-20 MED ORDER — PAROXETINE HCL 40 MG PO TABS: 40.0000 mg | ORAL_TABLET | Freq: Every day | ORAL | 2 refills | Status: DC

## 2017-05-20 NOTE — Progress Notes (Signed)
Patient ID: [name] MRN: [id number], DOB: Dec 25, 1955 61 y.o. Date of Encounter: 05/20/2017, 4:49 PM    Chief Complaint: Physical (CPE)  HPI: 61 y.o. y/o white male here for CPE and to re-establish Primary Care here.    02/25/2013: He saw Dr. Melanee Spry in the past "until he went to that system where you have to pay a bunch of money upfront." LOV there was 07/2008.  He sees Dr. Mare Ferrari (Cardiology) routinely so he has refilled all his meds etc so far but he has recommended that he needs to establish with a PCP.  Panic/Anxiety: Says he was started on Paxil in 1994 b/c of panic and anxiety. (not depression). Says he was on small dose initially and over time, dose has slowly been increased. Now on 40mg  and this works   well. His mood is stable with no panic or anxiety.   CAD     Per Dr Mare Ferrari HTN     Per Dr. Mare Ferrari HLD      Per Dr Mare Ferrari Obesity; He is well aware of need for diet, exercise, weight loss Smoking: He is well aware of need to stop smoking and risk this has to his health. He refuses med for this and says he has cut back a whole lot. Smoked 2-3 ppd in past. Now 1/2 to 1 ppd.  12/21/2015: He states that since his last physical with me July 2014, he had continued to get refills from Dr. Mare Ferrari. However, says that he has retired and he saw a Manufacturing engineer for his last visit-- he told him he would need to get a primary care physician to prescribe his noncardiac medications and to see him for other medical problems. Therefore, he is here today to reestablish care here. He has no specific complaints or concerns to address today. He is fasting and is agreeable to update preventive care/physical.  He states that he is continued on the Paxil 40 mg and it has continued to work well. Anxiety is well controlled with this. Medication is causing no adverse effects. He states that he is still smoking about 1/2-1 pack per day. The only other updates to his medical history  that he has today are: I also discussed that he does have sleep apnea and uses CPAP. He says that he uses it every night but it really helps and has been on this since 1999. --Says that he sees Dr. Alfonso Ramus at [facility] [place] [facility] occasionally for steroid injection to his knee. --Says that he has had laser treatment of varicose veins on each leg recently. Says that that has helped significantly No other updates today. No complaints or concerns to address today.  He works at [employer]. 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 Says that he does sit quite a bit but then also is up moving around quite a bit.   05/20/2017: He reports that he had to come here 2 times this year and didn't see me either visit. One time he had the flu and one time bronchitis. Is that when he left here after that visit he quit smoking and hasn't smoked in 2 months. Has noticed that he has gained weight since he has quit smoking. Says he does think he is eating more. However he does feel like he is more active. He continues to see cardiology routinely and they continue to follow-up his hypertension and hyperlipidemia and CAD. He is here to follow-up to get refills on his Paxil. States that this continues to work well.  Is controlling his anxiety and panic. Discussed causing no adverse effects. He states that he does have area on his skin on his back that he wanted me to check. No other specific concerns today.    Review of Systems: Consitutional: No fever, chills, fatigue, night sweats, lymphadenopathy, or weight changes. Eyes: No visual changes, eye redness, or discharge. ENT/Mouth: Ears: No otalgia, tinnitus, hearing loss, discharge. Nose: No congestion, rhinorrhea, sinus pain, or epistaxis. Throat: No sore throat, post nasal drip, or teeth pain. Cardiovascular: No CP, palpitations, diaphoresis, DOE, edema, orthopnea, PND. Respiratory: No cough, hemoptysis, SOB, or wheezing. Gastrointestinal: No anorexia, dysphagia, reflux,  pain, nausea, vomiting, hematemesis, diarrhea, constipation, BRBPR, or melena. Genitourinary: No dysuria, frequency, urgency, hematuria, incontinence, nocturia, decreased urinary stream, discharge, impotence, or testicular pain/masses. Musculoskeletal: No decreased ROM, myalgias, stiffness, joint swelling, or weakness. Skin: No rash, erythema, lesion changes, pain, warmth, jaundice, or pruritis. Neurological: No headache, dizziness, syncope, seizures, tremors, memory loss, coordination problems, or paresthesias. Psychological: No anxiety, depression, hallucinations, SI/HI. Endocrine: No fatigue, polydipsia, polyphagia, polyuria, or known diabetes. All other systems were reviewed and are otherwise negative.  Past Medical History:  Diagnosis Date  . Arthritis of knee    seeing Dr. Alfonso Ramus  . Coronary artery disease   . Depression with anxiety    hx of  . Exogenous obesity   . History of sleep apnea   . Hyperlipidemia   . Hypertension   . Panic attacks    hx of  . Smoker unmotivated to quit   . Varicose veins      Past Surgical History:  Procedure Laterality Date  . CARDIAC CATHETERIZATION  09/06/2008   stent to mid LAD and proximal LAD  . COLONOSCOPY N/A 05/01/2013   Procedure: COLONOSCOPY;  Surgeon: [name], MD;  [facility];  Service: Endoscopy;  Laterality: N/A;  8:30 AM  . polyps of lung  2008   removed by [facility]  . TONSILLECTOMY      Home Meds:  Current Outpatient Prescriptions on File Prior to Visit  Medication Sig Dispense Refill  . aspirin EC 81 MG tablet Take 81 mg by mouth daily.    . clopidogrel (PLAVIX) 75 MG tablet take 1 tablet by mouth once daily 90 tablet 3  . losartan (COZAAR) 100 MG tablet take 1 tablet once daily 90 tablet 1  . metoprolol succinate (TOPROL-XL) 25 MG 24 hr tablet take 1/2 tablet by mouth once daily 45 tablet 3  . nitroGLYCERIN (NITROSTAT) 0.4 MG SL tablet Place 1 tablet (0.4 mg total) under the tongue every 5  (five) minutes as needed for chest pain. 25 tablet prn   No current facility-administered medications on file prior to visit.     Allergies: No Known Allergies  Social History   Social History  . Marital status: Married    Spouse name: N/A  . Number of children: N/A  . Years of education: N/A   Occupational History  . [employer] Work    Social History Main Topics  . Smoking status: Former Smoker    Packs/day: 1.00    Years: 40.00    Types: Cigarettes    Quit date: 03/19/2017  . Smokeless tobacco: Never Used  . Alcohol use 0.0 oz/week     Comment: occasionally  . Drug use: No  . Sexual activity: Not on file   Other Topics Concern  . 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  No exercise.    Family History  Problem Relation Age of Onset  . Heart disease Father        had cabg  . Cancer Father 26       esophageal  . Cancer Brother        lung cancer  . Colon cancer Neg Hx     Physical Exam: Blood pressure (!) 142/72, pulse (!) 54, temperature (!) 97.5 F (36.4 C), temperature source Oral, resp. rate 16, height 6\' 1"  (1.854 m), weight (!) 144.2 kg (318 lb), SpO2 98 %.  General: Obese WM. Appears in no acute distress. Neck: Supple. Trachea midline. No thyromegaly. Full ROM. No lymphadenopathy. No carotid bruits. Lungs: Clear to auscultation bilaterally without wheezes, rales, or rhonchi. Breathing is of normal effort and unlabored. Cardiovascular: RRR with S1 S2. No murmurs, rubs, or gallops. Distal pulses 2+ symmetrically. No carotid or abdominal bruits. Lower legs with brawny changes and area of healing where he had laser treatment to varicose veins.  Musculoskeletal: Full range of motion and 5/5 strength throughout. His legs are obese. He has varicose veins. There is trace edema. Skin: Warm and moist without erythema, ecchymosis, wounds, or rash. He has lots of nevi on his back and abdomen and entire body. He has one on left low bqack  that is vary dark with assymetrical borders.  Neuro: A+Ox3. CN II-XII grossly intact. Moves all extremities spontaneously. Full sensation throughout. Normal gait.  Psych:  Responds to questions appropriately with a normal affect.   Assessment/Plan:  61 y.o. y/o  male here for   Skin cancer screening 05/20/17---he reports that he has never seen a dermatologist for a skin check. I recommend that he see dermatology. The skin lesion on his left low back is concerning given its appearance but he does report that that has been present since he was a teenager so that is reassuring. He has lots of additional skin lesions so recommend skin check with dermatology and he is agreeable. - Ambulatory referral to Dermatology  Panic attacks 05/20/2017---Stable/Controlled with Paxil . - PARoxetine (PAXIL) 40 MG tablet; Take 1 tablet (40 mg total) by mouth daily.  Dispense: 90 tablet; Refill: 2  Anxiety Stable/Controlled with current dose of Paxil  Smoker 05/20/17----congratulations on smoking cessation !!!!!!  Ischemic heart disease Per Cardiology H/O stents x 3 -- 08/2008  Hypercholesterolemia Per Cardiology.   Exogenous obesity Does no exercise. Obviously is not compliant with diet. Again, he is well aware of effects of this-he has known CAD, HTN, HLD.   History of sleep apnea He is compliant with using his CPAP every night.    THE FOLLOWING IS COPIED FROM PRIOR CPE:  -1. Visit for preventive health examination A. Screening Labs: - CMET - FLP  - CBC with Differential - TSH - Vitamin D 25 hydroxy - PSA  B. Screening For Prostate Cancer; Prostate Exam nml. Check PSA.  C. Colorectal Cancer Screening:  He had screening colonoscopy 05/01/2013 by Dr. Barney Drain. Normal. Repeat 10 years.  D. Immunizations:   Tetanus: Given here 02/25/2013  Pneumovax 23: Given that he is an ongoing smoker he needs to have Pneumovax 23. Discussed this with him today and he is agreeable.--Given here  12/21/2015 No further pneumonia vaccine indicated until age 50. Will discuss Zostavax at age 33.   05/20/2017: He can have follow-up office visit here in one year if things remain stable. Follow-up sooner if needed. He will also cont f/u with  Cardiology. At visit 05/20/17 at  discuss scheduling another complete physical exam but he defers at this time.  Signed:   [address] [place], [place]  05/20/2017 4:49 PM

## 2017-05-29 DIAGNOSIS — D225 Melanocytic nevi of trunk: Secondary | ICD-10-CM | POA: Diagnosis not present

## 2017-05-29 DIAGNOSIS — M170 Bilateral primary osteoarthritis of knee: Secondary | ICD-10-CM | POA: Diagnosis not present

## 2017-05-29 DIAGNOSIS — D485 Neoplasm of uncertain behavior of skin: Secondary | ICD-10-CM | POA: Diagnosis not present

## 2017-06-05 NOTE — Telephone Encounter (Signed)
LMOM again for pt.

## 2017-07-03 NOTE — Telephone Encounter (Signed)
LMOM again. Pt approved for Praluent 5 months ago and he has not returned any calls to let us know if he has started therapy. Will not reach out to pt again and will wait for his return call.

## 2017-07-15 ENCOUNTER — Other Ambulatory Visit: Payer: Self-pay | Admitting: *Deleted

## 2017-07-15 MED ORDER — CLOPIDOGREL BISULFATE 75 MG PO TABS: 75.0000 mg | ORAL_TABLET | Freq: Every day | ORAL | 0 refills | Status: DC

## 2017-10-01 DIAGNOSIS — M170 Bilateral primary osteoarthritis of knee: Secondary | ICD-10-CM | POA: Diagnosis not present

## 2017-10-28 ENCOUNTER — Other Ambulatory Visit: Payer: Self-pay | Admitting: Cardiovascular Disease

## 2017-10-29 NOTE — Progress Notes (Signed)
Cardiology Office Note   Date:  10/31/2017   ID:  [name], [name] October 14, 1955, MRN [id number]  PCP:  [name], PA-C  Cardiologist: Darlin Coco MD  No chief complaint on file.     History of Present Illness:  62 y.o. 2010 had stenting proximal and mid LAD with DES by [name]. Non ischemic myovue 04/24/16 Had myalgias on crestor and lipitor was approved for Praluent. But has not started   Varicose veins post ablative Rx by [name]. Works at [employer] doing [employer] work  Discussed need to start PSK9 and he is willing to see [facility] and do this    Past Medical History:  Diagnosis Date  . Arthritis of knee    seeing Dr. Alfonso Ramus  . Coronary artery disease   . Depression with anxiety    hx of  . Exogenous obesity   . History of sleep apnea   . Hyperlipidemia   . Hypertension   . Panic attacks    hx of  . Smoker unmotivated to quit   . Varicose veins     Past Surgical History:  Procedure Laterality Date  . CARDIAC CATHETERIZATION  09/06/2008   stent to mid LAD and proximal LAD  . COLONOSCOPY N/A 05/01/2013   Procedure: COLONOSCOPY;  Surgeon: [name], MD;  [facility];  Service: Endoscopy;  Laterality: N/A;  8:30 AM  . polyps of lung  2008   removed by [facility]  . TONSILLECTOMY       Current Outpatient Medications  Medication Sig Dispense Refill  . aspirin EC 81 MG tablet Take 81 mg by mouth daily.    . clopidogrel (PLAVIX) 75 MG tablet Take 1 tablet (75 mg total) by mouth daily. OVERDUE FOR FOLLOW UP. PLEASE CALL [phone] 30 tablet 0  . losartan (COZAAR) 100 MG tablet take 1 tablet once daily 90 tablet 1  . metoprolol succinate (TOPROL-XL) 25 MG 24 hr tablet take 1/2 tablet by mouth once daily 45 tablet 3  . nitroGLYCERIN (NITROSTAT) 0.4 MG SL tablet Place 1 tablet (0.4 mg total) under the tongue every 5 (five) minutes as needed for chest pain. 25 tablet prn  . PARoxetine (PAXIL) 40 MG tablet Take 1 tablet (40 mg  total) by mouth daily. 90 tablet 2   No current facility-administered medications for this visit.     Allergies:   Patient has no known allergies.    Social History:  The patient  reports that he quit smoking about 7 months ago. His smoking use included cigarettes. He has a 40.00 pack-year smoking history. he has never used smokeless tobacco. He reports that he drinks alcohol. He reports that he does not use drugs.   Family History:  The patient's family history includes Cancer in his brother; Cancer (age of onset: 34) in his father; Heart disease in his father.    ROS:  Please see the history of present illness.   Otherwise, review of systems are positive for none.   All other systems are reviewed and negative.    PHYSICAL EXAM: VS:  BP 138/72   Pulse 63   Ht 6\' 1"  (1.854 m)   Wt (!) 321 lb (145.6 kg)   BMI 42.35 kg/m  , BMI Body mass index is 42.35 kg/m. Affect appropriate Obese white male  HEENT: normal Neck supple with no adenopathy JVP normal no bruits no thyromegaly Lungs clear with no wheezing and good diaphragmatic motion Heart:  S1/S2 no murmur, no rub, gallop or click PMI normal Abdomen: benighn, BS positve, no tenderness, no AAA no bruit.  No HSM or HJR Distal pulses intact with no bruits Plus one bilateral edema with varicosities  Neuro non-focal Skin warm and dry No muscular weakness    EKG:   10/17/15  sinus bradycardia at 57 bpm.  Otherwise within normal limits 10/22/16  SR rate 68 normal T inversion 3 with inverted QRS 10/31/17 SR rate 63 normal   Recent Labs: 01/22/2017: ALT 19    Lipid Panel    Component Value Date/Time   CHOL 178 01/22/2017 0823   TRIG 137 01/22/2017 0823   HDL 31 (L) 01/22/2017 0823   CHOLHDL 5.7 (H) 01/22/2017 0823   CHOLHDL 3.7 08/03/2016 0738   VLDL 17 08/03/2016 0738   LDLCALC 120 (H) 01/22/2017 0823      Wt Readings from Last 3 Encounters:  10/31/17 (!) 321 lb (145.6 kg)  05/20/17 (!) 318 lb (144.2 kg)  03/19/17 (!)  301 lb (136.5 kg)        ASSESSMENT AND PLAN: 1. CAD: stenting LAD 2010 non ischemic myovue 2017 no chest pain continue medical Rx 2. HLD:  F/U lipid clinic start praluent   3. Obesity:  Discussed low carb diet and portion control  4. Bilateral varicose veins.  F/U [facility] post ablative Rx with improvement  5. Ortho: post arthroscopic surgery on left with improvement f/u Dr Alain Marion     Disposition: Continue current medication.  F/u with me in 6 months      [name]

## 2017-10-31 ENCOUNTER — Ambulatory Visit: Payer: 59 | Admitting: Cardiovascular Disease

## 2017-10-31 ENCOUNTER — Encounter: Payer: Self-pay | Admitting: Cardiovascular Disease

## 2017-10-31 VITALS — BP 138/72 | HR 63 | Ht 73.0 in | Wt 321.0 lb

## 2017-10-31 DIAGNOSIS — Z79899 Other long term (current) drug therapy: Secondary | ICD-10-CM | POA: Diagnosis not present

## 2017-10-31 DIAGNOSIS — I259 Chronic ischemic heart disease, unspecified: Secondary | ICD-10-CM | POA: Diagnosis not present

## 2017-10-31 DIAGNOSIS — I2510 Atherosclerotic heart disease of native coronary artery without angina pectoris: Secondary | ICD-10-CM | POA: Diagnosis not present

## 2017-10-31 DIAGNOSIS — E7800 Pure hypercholesterolemia, unspecified: Secondary | ICD-10-CM

## 2017-10-31 NOTE — Patient Instructions (Signed)
Medication Instructions:  Your physician recommends that you continue on your current medications as directed. Please refer to the Current Medication list given to you today.  Lab work: NONE  Testing/Procedures: NONE  Follow-Up: Your physician recommends that you schedule a follow-up appointment in: the [facility] as soon as possible.  Your physician wants you to follow-up in: 6 months with Dr. Johnsie Cancel. You will receive a reminder letter in the mail two months in advance. If you don't receive a letter, please call our office to schedule the follow-up appointment.   If you need a refill on your cardiac medications before your next appointment, please call your pharmacy.

## 2017-11-02 ENCOUNTER — Other Ambulatory Visit: Payer: Self-pay | Admitting: Physician Assistant

## 2017-11-02 ENCOUNTER — Other Ambulatory Visit: Payer: Self-pay | Admitting: Cardiovascular Disease

## 2017-11-02 DIAGNOSIS — F410 Panic disorder [episodic paroxysmal anxiety] without agoraphobia: Secondary | ICD-10-CM

## 2017-11-05 ENCOUNTER — Other Ambulatory Visit: Payer: Self-pay | Admitting: Cardiovascular Disease

## 2017-11-18 ENCOUNTER — Ambulatory Visit (INDEPENDENT_AMBULATORY_CARE_PROVIDER_SITE_OTHER): Payer: 59 | Admitting: Pharmacist

## 2017-11-18 ENCOUNTER — Encounter: Payer: Self-pay | Admitting: Pharmacist

## 2017-11-18 DIAGNOSIS — E7800 Pure hypercholesterolemia, unspecified: Secondary | ICD-10-CM | POA: Diagnosis not present

## 2017-11-18 NOTE — Patient Instructions (Signed)
Lipid/hepatic/direct LDL today.  WE will send for coverage of Praluent 75mg  every 14 days.  We will call you once we have obtained coverage. [phone].    Cholesterol Cholesterol is a fat. Your body needs a small amount of cholesterol. Cholesterol (plaque) may build up in your blood vessels (arteries). That makes you more likely to have a heart attack or stroke. You cannot feel your cholesterol level. Having a blood test is the only way to find out if your level is high. Keep your test results. Work with your doctor to keep your cholesterol at a good level. What do the results mean?  Total cholesterol is how much cholesterol is in your blood.  LDL is bad cholesterol. This is the type that can build up. Try to have low LDL.  HDL is good cholesterol. It cleans your blood vessels and carries LDL away. Try to have high HDL.  Triglycerides are fat that the body can store or burn for energy. What are good levels of cholesterol?  Total cholesterol below 200.  LDL below 100 is good for people who have health risks. LDL below 70 is good for people who have very high risks.  HDL above 40 is good. It is best to have HDL of 60 or higher.  Triglycerides below 150. How can I lower my cholesterol? Diet Follow your diet program as told by your doctor.  Choose fish, white meat chicken, or [place] that is roasted or baked. Try not to eat red meat, fried foods, sausage, or lunch meats.  Eat lots of fresh fruits and vegetables.  Choose whole grains, beans, pasta, potatoes, and cereals.  Choose olive oil, corn oil, or canola oil. Only use small amounts.  Try not to eat butter, mayonnaise, shortening, or palm kernel oils.  Try not to eat foods with trans fats.  Choose low-fat or nonfat dairy foods. ? Drink skim or nonfat milk. ? Eat low-fat or nonfat yogurt and cheeses. ? Try not to drink whole milk or cream. ? Try not to eat ice cream, egg yolks, or full-fat cheeses.  Healthy desserts  include angel food cake, ginger snaps, animal crackers, hard candy, popsicles, and low-fat or nonfat frozen yogurt. Try not to eat pastries, cakes, pies, and cookies.  Exercise Follow your exercise program as told by your doctor.  Be more active. Try gardening, walking, and taking the stairs.  Ask your doctor about ways that you can be more active.  Medicine  Take over-the-counter and prescription medicines only as told by your doctor. This information is not intended to replace advice given to you by your health care provider. Make sure you discuss any questions you have with your health care provider. Document Released: 11/09/2008 Document Revised: 03/14/2016 Document Reviewed: 02/23/2016 Elsevier Interactive Patient Education  [employer].

## 2017-11-18 NOTE — Progress Notes (Signed)
Patient ID: [name]                 DOB: 04-30-1956                    MRN: [id number]     HPI: [name] is a 62 y.o. male patient of Dr. Johnsie Cancel that presents today for lipid evaluation.  PMH includes 2010 had stenting proximal and mid LAD with DES by [name]. Non ischemic myovue 04/24/16 Had myalgias on crestor and lipitor was approved for Praluent therapy, but never started. We attempted to contact several times as he qualified for free medication with the copay card, but he never responded to our messages.   He presents today for further discussion on PCSK9i therapy. He states that he "chickened out" last year and never started [facility] therapy. Discussed reasons he was hesitant to initiate and answered all questions about the medication.    Risk Factors: CAD s/p stenting LDL Goal: <70  Current Medications: Zetia 10mg  daily  Intolerances: Crestor 10mg  and 20mg  daily, Lipitor 10mg  and 20mg  daily, 10mg  every other day - myalgias, niacin - flushing  Diet: Likes meat and bread - chicken, pork, deli ham and [place]. Likes seafood. Has been drinking more water.  Exercise: Arthritis in his legs limits activity.  Family History: The patient's family history includes Cancer in his brother; Cancer (age of onset: 70) in his father; Heart disease in his father.  Social History: The patient reports that he has been smoking Cigarettes. He has a 40.00 pack-year smoking history. He has never used smokeless tobacco. He reports that he drinks alcohol. He reports that he does not use drugs.  Labs: 01/22/2017: TC 178, TG 137, HDL 31, LDL 120 (Zetia 10mg  daily) 08/03/2016: TC 116, TG 85, HDL 31, LDL 68 (Crestor 10mg  and Zetia 10mg )   Past Medical History:  Diagnosis Date  . Arthritis of knee    seeing Dr. Alfonso Ramus  . Coronary artery disease   . Depression with anxiety    hx of  . Exogenous obesity   . History of sleep apnea   . Hyperlipidemia   . Hypertension   . Panic  attacks    hx of  . Smoker unmotivated to quit   . Varicose veins     Current Outpatient Medications on File Prior to Visit  Medication Sig Dispense Refill  . aspirin EC 81 MG tablet Take 81 mg by mouth daily.    . clopidogrel (PLAVIX) 75 MG tablet Take 1 tablet (75 mg total) by mouth daily. OVERDUE FOR FOLLOW UP. PLEASE CALL [phone] 30 tablet 0  . losartan (COZAAR) 100 MG tablet TAKE 1 TABLET ONCE DAILY 90 tablet 3  . metoprolol succinate (TOPROL-XL) 25 MG 24 hr tablet TAKE 1/2 TABLET BY MOUTH ONCE DAILY 45 tablet 3  . nitroGLYCERIN (NITROSTAT) 0.4 MG SL tablet Place 1 tablet (0.4 mg total) under the tongue every 5 (five) minutes as needed for chest pain. 25 tablet prn  . PARoxetine (PAXIL) 40 MG tablet TAKE 1 TABLET BY MOUTH ONCE DAILY 90 tablet 0   No current facility-administered medications on file prior to visit.     No Known Allergies  Assessment/Plan: Hyperlipidemia: Lipid panel today for approval of PCSK9i therapy. Risk vs. Benefit, injection technique and copay card all discussed today. He is comfortable giving injection. Will notify him once approved (await lipid results) by insurance.   Thank you,  Lelan Pons. Patterson Hammersmith, PharmD  [facility]  11/18/2017 8:05 AM

## 2017-11-19 LAB — HEPATIC FUNCTION PANEL
ALBUMIN: 4.4 g/dL (ref 3.5–5.5)
ALT: 19 IU/L (ref 0–44)
AST: 16 IU/L (ref 0–40)
Alkaline Phosphatase: 64 IU/L (ref 39–117)
BILIRUBIN TOTAL: 0.3 mg/dL (ref 0.0–1.2)
Bilirubin, Direct: 0.08 mg/dL (ref 0.00–0.40)
TOTAL PROTEIN: 6.5 g/dL (ref 6.0–8.5)

## 2017-11-19 LAB — LIPID PANEL
CHOL/HDL RATIO: 7.7 ratio — AB (ref 0.0–5.0)
Cholesterol, Total: 224 mg/dL — ABNORMAL HIGH (ref 100–199)
HDL: 29 mg/dL — AB (ref >39)
LDL Calculated: 158 mg/dL — ABNORMAL HIGH (ref 0–99)
Triglycerides: 185 mg/dL — ABNORMAL HIGH (ref 0–149)
VLDL Cholesterol Cal: 37 mg/dL (ref 5–40)

## 2017-11-19 LAB — LDL CHOLESTEROL, DIRECT: LDL DIRECT: 178 mg/dL — AB (ref 0–99)

## 2017-11-20 ENCOUNTER — Telehealth: Payer: Self-pay | Admitting: Pharmacist

## 2017-11-20 DIAGNOSIS — E782 Mixed hyperlipidemia: Secondary | ICD-10-CM

## 2017-11-20 MED ORDER — ALIROCUMAB 75 MG/ML ~~LOC~~ SOPN: 1.0000 "pen " | PEN_INJECTOR | SUBCUTANEOUS | 11 refills | Status: DC

## 2017-11-20 NOTE — Telephone Encounter (Signed)
Prior authorization for Praluent has been approved through 05/22/18. Pt qualifies for $0 copay card. Rx has been sent to specialty pharmacy. Pt aware to contact clinic once he receives his first shipment.

## 2017-11-21 MED ORDER — ALIROCUMAB 75 MG/ML ~~LOC~~ SOPN: 1.0000 "pen " | PEN_INJECTOR | SUBCUTANEOUS | 11 refills | Status: DC

## 2017-11-21 NOTE — Addendum Note (Signed)
Addended by: SUPPLE, MEGAN E on: 11/21/2017 08:32 AM   Modules accepted: Orders

## 2017-11-21 NOTE — Telephone Encounter (Signed)
Pt's insurance mandates that he fill Praluent at [facility]. New rx has been sent in and pt made aware.

## 2017-11-27 ENCOUNTER — Other Ambulatory Visit: Payer: Self-pay | Admitting: Cardiovascular Disease

## 2017-12-24 ENCOUNTER — Encounter: Payer: Self-pay | Admitting: Cardiovascular Disease

## 2017-12-24 NOTE — Addendum Note (Signed)
Addended by: SUPPLE, MEGAN E on: 12/24/2017 12:51 PM   Modules accepted: Orders

## 2017-12-24 NOTE — Telephone Encounter (Signed)
Pt to have 3rd injection next weekend. He is tolerating Praluent well. Scheduled f/u lab work to assess efficacy.

## 2017-12-25 ENCOUNTER — Telehealth: Payer: Self-pay

## 2017-12-25 MED ORDER — NITROGLYCERIN 0.4 MG SL SUBL: 0.4000 mg | SUBLINGUAL_TABLET | SUBLINGUAL | 3 refills | Status: DC | PRN

## 2017-12-25 NOTE — Telephone Encounter (Signed)
Sent in refill for nitro at patient's request per MyChart.

## 2017-12-31 ENCOUNTER — Other Ambulatory Visit: Payer: 59

## 2017-12-31 DIAGNOSIS — E782 Mixed hyperlipidemia: Secondary | ICD-10-CM | POA: Diagnosis not present

## 2017-12-31 LAB — LIPID PANEL
CHOLESTEROL TOTAL: 151 mg/dL (ref 100–199)
Chol/HDL Ratio: 4.9 ratio (ref 0.0–5.0)
HDL: 31 mg/dL — AB (ref >39)
LDL CALC: 98 mg/dL (ref 0–99)
TRIGLYCERIDES: 111 mg/dL (ref 0–149)
VLDL CHOLESTEROL CAL: 22 mg/dL (ref 5–40)

## 2017-12-31 LAB — HEPATIC FUNCTION PANEL
ALBUMIN: 4.2 g/dL (ref 3.5–5.5)
ALT: 18 IU/L (ref 0–44)
AST: 15 IU/L (ref 0–40)
Alkaline Phosphatase: 64 IU/L (ref 39–117)
Bilirubin Total: 0.3 mg/dL (ref 0.0–1.2)
Bilirubin, Direct: 0.1 mg/dL (ref 0.00–0.40)
Total Protein: 6.5 g/dL (ref 6.0–8.5)

## 2018-01-23 ENCOUNTER — Other Ambulatory Visit: Payer: Self-pay | Admitting: Physician Assistant

## 2018-01-23 DIAGNOSIS — F410 Panic disorder [episodic paroxysmal anxiety] without agoraphobia: Secondary | ICD-10-CM

## 2018-05-13 DIAGNOSIS — M170 Bilateral primary osteoarthritis of knee: Secondary | ICD-10-CM | POA: Diagnosis not present

## 2018-05-23 ENCOUNTER — Other Ambulatory Visit: Payer: Self-pay | Admitting: Physician Assistant

## 2018-05-23 DIAGNOSIS — F410 Panic disorder [episodic paroxysmal anxiety] without agoraphobia: Secondary | ICD-10-CM

## 2018-05-23 NOTE — Telephone Encounter (Signed)
Patient is due for an office visit will only dispense# 30 letter sent via [employer]

## 2018-06-17 NOTE — Progress Notes (Signed)
Cardiology Office Note   Date:  06/23/2018   ID:  [name], [name] 1955/09/29, MRN [id number]  PCP:  [name] Sheldon, PA-C  Cardiologist: Darlin Coco MD  No chief complaint on file.     History of Present Illness:  62 y.o. 2010 had stenting proximal and mid LAD with DES by Dr [name]. Non ischemic myovue 04/24/16 Had myalgias on crestor and lipitor was approved for Varicose veins post ablative Rx by Dr [name]. 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 Arthritis of knee    seeing Dr. Alfonso [name]  . Coronary artery disease   . Depression with anxiety    hx of  . Exogenous obesity   . History of sleep apnea   . Hyperlipidemia   . Hypertension   . Panic attacks    hx of  . Smoker unmotivated to quit   . Varicose veins     Past Surgical History:  Procedure Laterality Date  . CARDIAC CATHETERIZATION  09/06/2008   stent to mid LAD and proximal LAD  . COLONOSCOPY N/A 05/01/2013   Procedure: COLONOSCOPY;  Surgeon: [name] Binder, MD;  [facility];  Service: Endoscopy;  Laterality: N/A;  8:30 AM  . polyps of lung  2008   removed by [facility]  . TONSILLECTOMY       Current Outpatient Medications  Medication Sig Dispense Refill  . Alirocumab (PRALUENT) 75 MG/ML SOPN Inject 1 pen into the skin every 14 (fourteen) days. 2 pen 11  . aspirin EC 81 MG tablet Take 81 mg by mouth daily.    . clopidogrel (PLAVIX) 75 MG tablet TAKE 1 TABLET BY MOUTH DAILY 90 tablet 3  . losartan (COZAAR) 100 MG tablet TAKE 1 TABLET ONCE DAILY 90 tablet 3  . metoprolol succinate (TOPROL-XL) 25 MG 24 hr tablet TAKE 1/2 TABLET BY MOUTH ONCE DAILY 45 tablet 3  . nitroGLYCERIN (NITROSTAT) 0.4 MG SL tablet Place 1 tablet (0.4 mg total) under the tongue every 5 (five) minutes as needed for chest pain. 25  tablet 3  . PARoxetine (PAXIL) 40 MG tablet TAKE 1 TABLET BY MOUTH EVERY DAY 30 tablet 0  . hydrochlorothiazide (HYDRODIURIL) 25 MG tablet Take 1 tablet (25 mg total) by mouth daily as needed (edema). 30 tablet 11   No current facility-administered medications for this visit.     Allergies:   Patient has no known allergies.    Social History:  The patient  reports that he quit smoking about 15 months ago. His smoking use included cigarettes. He has a 40.00 pack-year smoking history. He has never used smokeless tobacco. He reports that he drinks alcohol. He reports that he does not use drugs.   Family History:  The patient's family history includes Cancer in his brother; Cancer (age of onset: 81) in his father; Heart disease in his father.    ROS:  Please see the history of present illness.   Otherwise, review of systems are positive for none.   All other systems are reviewed and negative.    PHYSICAL EXAM: VS:  BP 140/70   Pulse (!) 58   Ht 6\' 1"  (1.854 m)   Wt (!) 316 lb 12 oz (143.7 kg)   SpO2 97%  BMI 41.79 kg/m  , BMI Body mass index is 41.79 kg/m. Affect appropriate Obese white male  HEENT: normal Neck supple with no adenopathy JVP normal no bruits no thyromegaly Lungs clear with no wheezing and good diaphragmatic motion Heart:  S1/S2 no murmur, no rub, gallop or click PMI normal Abdomen: benighn, BS positve, no tenderness, no AAA no bruit.  No HSM or HJR Distal pulses intact with no bruits Plus one bilateral edema with varicosities  Neuro non-focal Skin warm and dry No muscular weakness    EKG:   10/17/15  sinus bradycardia at 57 bpm.  Otherwise within normal limits 10/22/16  SR rate 68 normal T inversion 3 with inverted QRS 10/31/17 SR rate 63 normal   Recent Labs: 12/31/2017: ALT 18    Lipid Panel    Component Value Date/Time   CHOL 151 12/31/2017 0820   TRIG 111 12/31/2017 0820   HDL 31 (L) 12/31/2017 0820   CHOLHDL 4.9 12/31/2017 0820   CHOLHDL 3.7  08/03/2016 0738   VLDL 17 08/03/2016 0738   LDLCALC 98 12/31/2017 0820   LDLDIRECT 178 (H) 11/18/2017 1628      Wt Readings from Last 3 Encounters:  06/23/18 (!) 316 lb 12 oz (143.7 kg)  10/31/17 (!) 321 lb (145.6 kg)  05/20/17 (!) 318 lb (144.2 kg)        ASSESSMENT AND PLAN: 1. CAD: stenting LAD 2010 non ischemic myovue 2017 no chest pain continue medical Rx 2. HLD:  F/U labs should be improved on PSK-9 Praluent  3. Obesity:  Discussed low carb diet and portion control  4. Bilateral varicose veins.  F/U [facility] post ablative Rx with chronic stasis referred back to [facility] Has PRN HCTZ  5. Ortho: post arthroscopic surgery on left with improvement f/u Dr Alain Marion     Disposition: Continue current medication.  F/u with me in 6 months      [name]

## 2018-06-22 ENCOUNTER — Other Ambulatory Visit: Payer: Self-pay | Admitting: Physician Assistant

## 2018-06-22 DIAGNOSIS — F410 Panic disorder [episodic paroxysmal anxiety] without agoraphobia: Secondary | ICD-10-CM

## 2018-06-23 ENCOUNTER — Encounter: Payer: Self-pay | Admitting: Cardiovascular Disease

## 2018-06-23 ENCOUNTER — Ambulatory Visit: Payer: 59 | Admitting: Cardiovascular Disease

## 2018-06-23 VITALS — BP 140/70 | HR 58 | Ht 73.0 in | Wt 316.8 lb

## 2018-06-23 DIAGNOSIS — I83893 Varicose veins of bilateral lower extremities with other complications: Secondary | ICD-10-CM

## 2018-06-23 DIAGNOSIS — E7800 Pure hypercholesterolemia, unspecified: Secondary | ICD-10-CM | POA: Diagnosis not present

## 2018-06-23 DIAGNOSIS — I2510 Atherosclerotic heart disease of native coronary artery without angina pectoris: Secondary | ICD-10-CM | POA: Diagnosis not present

## 2018-06-23 MED ORDER — HYDROCHLOROTHIAZIDE 25 MG PO TABS: 25.0000 mg | ORAL_TABLET | Freq: Every day | ORAL | 11 refills | Status: DC | PRN

## 2018-06-23 NOTE — Telephone Encounter (Signed)
Last OV 05/20/2017 Last refill 05/23/2018 Ok to refill?

## 2018-06-23 NOTE — Patient Instructions (Addendum)
Medication Instructions:  Your physician recommends that you continue on your current medications as directed. Please refer to the Current Medication list given to you today.  Labwork: NONE  Testing/Procedures: NONE  Follow-Up: You have been referred to [facility] for follow-up, they will call you with an appointment.   Your physician wants you to follow-up in: 6 months with Dr. Johnsie Cancel. You will receive a reminder letter in the mail two months in advance. If you don't receive a letter, please call our office to schedule the follow-up appointment.   If you need a refill on your cardiac medications before your next appointment, please call your pharmacy.

## 2018-07-08 ENCOUNTER — Other Ambulatory Visit: Payer: Self-pay | Admitting: *Deleted

## 2018-07-08 DIAGNOSIS — I83811 Varicose veins of right lower extremities with pain: Secondary | ICD-10-CM

## 2018-07-16 ENCOUNTER — Ambulatory Visit (HOSPITAL_COMMUNITY)
Admission: RE | Admit: 2018-07-16 | Discharge: 2018-07-16 | Disposition: A | Discharge status: Home / Self Care | Payer: 59 | Source: Ambulatory Visit | Attending: Family | Admitting: Family

## 2018-07-16 DIAGNOSIS — I83811 Varicose veins of right lower extremities with pain: Secondary | ICD-10-CM | POA: Diagnosis present

## 2018-07-30 ENCOUNTER — Telehealth: Payer: Self-pay | Admitting: *Deleted

## 2018-07-30 ENCOUNTER — Other Ambulatory Visit: Payer: Self-pay | Admitting: Family Medicine

## 2018-07-30 DIAGNOSIS — F410 Panic disorder [episodic paroxysmal anxiety] without agoraphobia: Secondary | ICD-10-CM

## 2018-07-30 NOTE — Telephone Encounter (Signed)
Patient had called in wanting to know the results of his reflux study/r/o DVT. I reviewed the results with him. 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 There is thrombus though in the vessel, so redoing the laser would not be advised. His complaint is swelling and discomfort. He has resumed wearing stockings and trying to elevate. I went over the best ways to elevate, etc. Compression and elevation are his best bet right now. He expressed understanding. He will follow up with [place] as needed.

## 2018-08-13 DIAGNOSIS — M170 Bilateral primary osteoarthritis of knee: Secondary | ICD-10-CM | POA: Diagnosis not present

## 2018-08-25 ENCOUNTER — Encounter: Payer: 59 | Admitting: Family Medicine

## 2018-08-28 ENCOUNTER — Other Ambulatory Visit: Payer: Self-pay | Admitting: Family Medicine

## 2018-08-28 DIAGNOSIS — F410 Panic disorder [episodic paroxysmal anxiety] without agoraphobia: Secondary | ICD-10-CM

## 2018-09-18 ENCOUNTER — Ambulatory Visit (INDEPENDENT_AMBULATORY_CARE_PROVIDER_SITE_OTHER): Payer: 59 | Admitting: Family Medicine

## 2018-09-18 ENCOUNTER — Encounter: Payer: Self-pay | Admitting: Family Medicine

## 2018-09-18 VITALS — BP 130/78 | HR 60 | Temp 98.2°F | Resp 16 | Ht 73.0 in | Wt 318.0 lb

## 2018-09-18 DIAGNOSIS — I259 Chronic ischemic heart disease, unspecified: Secondary | ICD-10-CM

## 2018-09-18 DIAGNOSIS — Z1159 Encounter for screening for other viral diseases: Secondary | ICD-10-CM | POA: Diagnosis not present

## 2018-09-18 DIAGNOSIS — I10 Essential (primary) hypertension: Secondary | ICD-10-CM

## 2018-09-18 DIAGNOSIS — Z122 Encounter for screening for malignant neoplasm of respiratory organs: Secondary | ICD-10-CM

## 2018-09-18 DIAGNOSIS — E7800 Pure hypercholesterolemia, unspecified: Secondary | ICD-10-CM | POA: Diagnosis not present

## 2018-09-18 DIAGNOSIS — Z125 Encounter for screening for malignant neoplasm of prostate: Secondary | ICD-10-CM

## 2018-09-18 DIAGNOSIS — Z0000 Encounter for general adult medical examination without abnormal findings: Secondary | ICD-10-CM

## 2018-09-18 NOTE — Progress Notes (Signed)
Subjective:    Patient ID: [name], male    DOB: [birth year], 63 y.o.   MRN: [id number]  HPI  Patient is here today for complete physical exam.  He is previously been seen my partner who is recently retired.  Therefore he is establishing care with me.  Viewing his chart, current gaps in care include hepatitis C screening, HIV screening, and he is due for a flu shot.  Patient had a tetanus shot in 2014.  He is also had Pneumovax 23 in 2017.  He is not due for Prevnar 13 until he turns 36.  He is due for the shingles vaccine and we discussed that today.  Last colonoscopy was 2014 and was normal.   Patient politely declines the flu shot today.  We will he will check with his insurance regarding the shingles vaccine.  He is due for lung cancer screening having 30-pack-year history of smoking and he would be interested in this test. Past Medical History:  Diagnosis Date  . Arthritis of knee    seeing Dr. Alfonso Ramus  . Coronary artery disease   . Depression with anxiety    hx of  . Exogenous obesity   . History of sleep apnea   . Hyperlipidemia   . Hypertension   . Panic attacks    hx of  . Smoker unmotivated to quit   . Varicose veins    Past Surgical History:  Procedure Laterality Date  . CARDIAC CATHETERIZATION  09/06/2008   stent to mid LAD and proximal LAD  . COLONOSCOPY N/A 05/01/2013   Procedure: COLONOSCOPY;  Surgeon: [name], MD;  [facility];  Service: Endoscopy;  Laterality: N/A;  8:30 AM  . polyps of lung  2008   removed by [facility]  . TONSILLECTOMY     Current Outpatient Medications on File Prior to Visit  Medication Sig Dispense Refill  . Alirocumab (PRALUENT) 75 MG/ML SOPN Inject 1 pen into the skin every 14 (fourteen) days. 2 pen 11  . aspirin EC 81 MG tablet Take 81 mg by mouth daily.    . clopidogrel (PLAVIX) 75 MG tablet TAKE 1 TABLET BY MOUTH DAILY 90 tablet 3  . hydrochlorothiazide (HYDRODIURIL) 25 MG tablet Take 1 tablet (25 mg  total) by mouth daily as needed (edema). 30 tablet 11  . losartan (COZAAR) 100 MG tablet TAKE 1 TABLET ONCE DAILY 90 tablet 3  . metoprolol succinate (TOPROL-XL) 25 MG 24 hr tablet TAKE 1/2 TABLET BY MOUTH ONCE DAILY 45 tablet 3  . nitroGLYCERIN (NITROSTAT) 0.4 MG SL tablet Place 1 tablet (0.4 mg total) under the tongue every 5 (five) minutes as needed for chest pain. 25 tablet 3  . PARoxetine (PAXIL) 40 MG tablet TAKE 1 TABLET(40 MG) BY MOUTH DAILY 30 tablet 0   No current facility-administered medications on file prior to visit.    No Known Allergies Social History   Socioeconomic History  . Marital status: Married    Spouse name: Not on file  . Number of children: Not on file  . Years of education: Not on file  . Highest education level: Not on file  Occupational History  . 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 Financial resource strain: Not on file  . Food insecurity:    Worry: Not on file    Inability: Not on file  . Transportation needs:    Medical: Not on file    Non-medical: Not on file  Tobacco  Use  . Smoking status: Former Smoker    Packs/day: 1.00    Years: 40.00    Pack years: 40.00    Types: Cigarettes    Last attempt to quit: 03/19/2017    Years since quitting: 1.5  . Smokeless tobacco: Never Used  Substance and Sexual Activity  . Alcohol use: Yes    Alcohol/week: 0.0 standard drinks    Comment: occasionally  . Drug use: No  . Sexual activity: Not on file  Lifestyle  . Physical activity:    Days per week: Not on file    Minutes per session: Not on file  . Stress: Not on file  Relationships  . Social connections:    Talks on phone: Not on file    Gets together: Not on file    Attends religious service: Not on file    Active member of club or organization: Not on file    Attends meetings of clubs or organizations: Not on file    Relationship status: Not on file  . Intimate partner violence:    Fear of current or ex partner: Not on file     Emotionally abused: Not on file    Physically abused: Not on file    Forced sexual activity: Not on file  Other Topics Concern  . 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    Family History  Problem Relation Age of Onset  . Heart disease Father        had cabg  . Cancer Father 68       esophageal  . Cancer Brother        lung cancer  . Colon cancer Neg Hx       Review of Systems     Objective:   Physical Exam  Constitutional: He is oriented to person, place, and time. He appears well-developed and well-nourished. No distress.  HENT:  Head: Normocephalic and atraumatic.  Right Ear: Tympanic membrane, external ear and ear canal normal.  Left Ear: Tympanic membrane, external ear and ear canal normal.  Nose: Nose normal.  Mouth/Throat: Oropharynx is clear and moist. No oropharyngeal exudate.  Eyes: Pupils are equal, round, and reactive to light. Conjunctivae and EOM are normal. Right eye exhibits no discharge. Left eye exhibits no discharge. No scleral icterus.  Neck: Normal range of motion. Neck supple. No JVD present. No tracheal deviation present. No thyromegaly present.  Cardiovascular: Normal rate, regular rhythm and normal heart sounds. Exam reveals no gallop and no friction rub.  No murmur heard. Pulmonary/Chest: Effort normal and breath sounds normal. No stridor. No respiratory distress. He has no decreased breath sounds. He has no wheezes. He has no rales. He exhibits no tenderness.  Abdominal: Soft. Bowel sounds are normal. He exhibits no distension and no mass. There is no abdominal tenderness. There is no rebound and no guarding.  Musculoskeletal: Normal range of motion.        General: No tenderness, deformity or edema.  Lymphadenopathy:    He has no cervical adenopathy.  Neurological: He is alert and oriented to person, place, and time. He has normal reflexes. He displays normal reflexes. No cranial nerve deficit. He  exhibits normal muscle tone. Coordination normal.  Skin: Skin is warm. No rash noted. He is not diaphoretic. No erythema. No pallor.  Psychiatric: He has a normal mood and affect. His behavior is normal. Judgment and thought content normal.  Vitals reviewed.  Patient declines digital rectal exam       Assessment & Plan:  General medical exam  Ischemic heart disease - Plan: CBC with Differential/Platelet, COMPLETE METABOLIC PANEL WITH GFR, Lipid panel  Essential hypertension  Hypercholesterolemia - Plan: CBC with Differential/Platelet, COMPLETE METABOLIC PANEL WITH GFR, Lipid panel  Prostate cancer screening - Plan: PSA  Encounter for hepatitis C screening test for low risk patient - Plan: Hepatitis C Antibody  Patient's blood pressure is excellent.  His HIV screening but he will consent to hepatitis C screening.  He politely declines a flu shot.  We discussed the shingles vaccine.  The remainder of his immunizations are up-to-date.  I will screen the patient for prostate cancer with a PSA.  Check a CMP, CBC, fasting lipid panel, as well as the PSA and hepatitis C screening.  I will schedule the patient for a CT scan to screen for lung cancer.  If the patient changes his mind, he can return at any time for a flu shot.  Strongly strongly strongly encourage smoking cessation.  I believe this would be more beneficial to the patient even then weight loss.  We will continue to pester him routinely about this.

## 2018-09-19 LAB — LIPID PANEL
CHOLESTEROL: 157 mg/dL (ref <200)
HDL: 36 mg/dL — ABNORMAL LOW (ref >40)
LDL Cholesterol (Calc): 97 mg/dL (calc)
Non-HDL Cholesterol (Calc): 121 mg/dL (calc) (ref <130)
Total CHOL/HDL Ratio: 4.4 (calc) (ref <5.0)
Triglycerides: 137 mg/dL (ref <150)

## 2018-09-19 LAB — COMPLETE METABOLIC PANEL WITH GFR
AG Ratio: 1.7 (calc) (ref 1.0–2.5)
ALBUMIN MSPROF: 4.1 g/dL (ref 3.6–5.1)
ALT: 20 U/L (ref 9–46)
AST: 16 U/L (ref 10–35)
Alkaline phosphatase (APISO): 62 U/L (ref 40–115)
BUN: 13 mg/dL (ref 7–25)
CALCIUM: 9.3 mg/dL (ref 8.6–10.3)
CO2: 25 mmol/L (ref 20–32)
Chloride: 106 mmol/L (ref 98–110)
Creat: 0.8 mg/dL (ref 0.70–1.25)
GFR, EST AFRICAN AMERICAN: 113 mL/min/{1.73_m2} (ref >=60)
GFR, Est Non African American: 97 mL/min/{1.73_m2} (ref >=60)
Globulin: 2.4 g/dL (calc) (ref 1.9–3.7)
Glucose, Bld: 102 mg/dL — ABNORMAL HIGH (ref 65–99)
Potassium: 4.4 mmol/L (ref 3.5–5.3)
Sodium: 141 mmol/L (ref 135–146)
TOTAL PROTEIN: 6.5 g/dL (ref 6.1–8.1)
Total Bilirubin: 0.4 mg/dL (ref 0.2–1.2)

## 2018-09-19 LAB — CBC WITH DIFFERENTIAL/PLATELET
Absolute Monocytes: 400 cells/uL (ref 200–950)
BASOS PCT: 1.2 %
Basophils Absolute: 70 cells/uL (ref 0–200)
Eosinophils Absolute: 122 cells/uL (ref 15–500)
Eosinophils Relative: 2.1 %
HCT: 46.6 % (ref 38.5–50.0)
HEMOGLOBIN: 16 g/dL (ref 13.2–17.1)
Lymphs Abs: 1595 cells/uL (ref 850–3900)
MCH: 33.3 pg — ABNORMAL HIGH (ref 27.0–33.0)
MCHC: 34.3 g/dL (ref 32.0–36.0)
MCV: 96.9 fL (ref 80.0–100.0)
MPV: 8.7 fL (ref 7.5–12.5)
Monocytes Relative: 6.9 %
Neutro Abs: 3613 cells/uL (ref 1500–7800)
Neutrophils Relative %: 62.3 %
Platelets: 170 10*3/uL (ref 140–400)
RBC: 4.81 10*6/uL (ref 4.20–5.80)
RDW: 12.2 % (ref 11.0–15.0)
Total Lymphocyte: 27.5 %
WBC: 5.8 10*3/uL (ref 3.8–10.8)

## 2018-09-19 LAB — HEPATITIS C ANTIBODY
Hepatitis C Ab: NONREACTIVE
SIGNAL TO CUT-OFF: 0.02 (ref <1.00)

## 2018-09-19 LAB — PSA: PSA: 0.3 ng/mL (ref <=4.0)

## 2018-09-24 ENCOUNTER — Other Ambulatory Visit: Payer: Self-pay | Admitting: Family Medicine

## 2018-09-24 DIAGNOSIS — F410 Panic disorder [episodic paroxysmal anxiety] without agoraphobia: Secondary | ICD-10-CM

## 2018-10-07 ENCOUNTER — Ambulatory Visit (HOSPITAL_COMMUNITY)
Admission: RE | Admit: 2018-10-07 | Discharge: 2018-10-07 | Disposition: A | Discharge status: Home / Self Care | Payer: 59 | Source: Ambulatory Visit | Attending: Family Medicine | Admitting: Family Medicine

## 2018-10-07 DIAGNOSIS — Z122 Encounter for screening for malignant neoplasm of respiratory organs: Secondary | ICD-10-CM | POA: Insufficient documentation

## 2018-10-07 DIAGNOSIS — F17210 Nicotine dependence, cigarettes, uncomplicated: Secondary | ICD-10-CM | POA: Diagnosis not present

## 2018-11-23 ENCOUNTER — Other Ambulatory Visit: Payer: Self-pay | Admitting: Cardiovascular Disease

## 2018-12-01 ENCOUNTER — Telehealth: Payer: Self-pay | Admitting: Family Medicine

## 2018-12-01 ENCOUNTER — Other Ambulatory Visit: Payer: Self-pay | Admitting: Family Medicine

## 2018-12-01 ENCOUNTER — Encounter: Payer: Self-pay | Admitting: Family Medicine

## 2018-12-01 MED ORDER — PREDNISONE 20 MG PO TABS: ORAL_TABLET | ORAL | 0 refills | Status: DC

## 2018-12-01 NOTE — Telephone Encounter (Signed)
Pt called [facility] stating that he is having lower back pain that is running from his lower back to his butt down his leg and would like something called in for it or what to do?

## 2018-12-01 NOTE — Telephone Encounter (Signed)
Patient aware of providers recommendations via vm

## 2018-12-01 NOTE — Telephone Encounter (Signed)
Sent prednisone to [facility] for sciatica.

## 2018-12-05 ENCOUNTER — Encounter: Payer: Self-pay | Admitting: Family Medicine

## 2018-12-08 ENCOUNTER — Other Ambulatory Visit: Payer: Self-pay | Admitting: Family Medicine

## 2018-12-08 DIAGNOSIS — M545 Low back pain: Secondary | ICD-10-CM | POA: Diagnosis not present

## 2018-12-08 DIAGNOSIS — M170 Bilateral primary osteoarthritis of knee: Secondary | ICD-10-CM | POA: Diagnosis not present

## 2018-12-08 MED ORDER — HYDROCODONE-ACETAMINOPHEN 7.5-325 MG PO TABS: 1.0000 | ORAL_TABLET | Freq: Four times a day (QID) | ORAL | 0 refills | Status: DC | PRN

## 2018-12-12 ENCOUNTER — Telehealth: Payer: Self-pay | Admitting: Pharmacist

## 2018-12-12 ENCOUNTER — Other Ambulatory Visit: Payer: Self-pay | Admitting: Pharmacist

## 2018-12-12 MED ORDER — ALIROCUMAB 75 MG/ML ~~LOC~~ SOAJ: 1.0000 "pen " | SUBCUTANEOUS | 3 refills | Status: DC

## 2018-12-12 NOTE — Telephone Encounter (Signed)
Pt called back and states that he is getting cortisone injections and his ortho has him scheduled for a MRI today. If he still needs to been seen it will be after he is done with ortho. Informed if we refill pain med he would need to be seen 1st.

## 2018-12-12 NOTE — Telephone Encounter (Signed)
Called and left voicemail for patient to return call. Will send mychart message if I dont hear back. Contacted patient to verify he wants his Pra[facility] at [facility], as we received a fax from them requesting an Rx. Also wanted to discuss trying higher dose of Praluent due to LDL above goal and see if he needed a copay card.

## 2018-12-15 ENCOUNTER — Other Ambulatory Visit: Payer: Self-pay | Admitting: Sports Medicine

## 2018-12-15 DIAGNOSIS — M545 Low back pain, unspecified: Secondary | ICD-10-CM

## 2018-12-15 MED ORDER — ALIROCUMAB 150 MG/ML ~~LOC~~ SOAJ: 1.0000 "pen " | SUBCUTANEOUS | 3 refills | Status: DC

## 2018-12-15 NOTE — Telephone Encounter (Signed)
Patient returned call, but left VM on office phone. Attempted to call patient back - cell went right to voicemail. Called work #- was transfer but had to leave message on VM.

## 2018-12-15 NOTE — Telephone Encounter (Signed)
Pt returned call to clinic - he prefers refill at [facility]. He is ok with increasing his Praluent dose to 150mg  Q2W. He states [facility] should already have his copay card information - I am not sure if this will work if he gets refill as a 3 month supply instead of a 1 month supply. I advised pt to call if they give him trouble with 3 month supply and using the copay card - would need to go back to 1 month fill at a time.

## 2018-12-17 ENCOUNTER — Other Ambulatory Visit: Payer: Self-pay

## 2018-12-17 ENCOUNTER — Ambulatory Visit
Admission: RE | Admit: 2018-12-17 | Discharge: 2018-12-17 | Disposition: A | Discharge status: Home / Self Care | Payer: 59 | Source: Ambulatory Visit | Attending: Sports Medicine | Admitting: Sports Medicine

## 2018-12-17 DIAGNOSIS — M545 Low back pain, unspecified: Secondary | ICD-10-CM

## 2018-12-17 DIAGNOSIS — M48061 Spinal stenosis, lumbar region without neurogenic claudication: Secondary | ICD-10-CM | POA: Diagnosis not present

## 2018-12-19 ENCOUNTER — Telehealth: Payer: Self-pay

## 2018-12-19 DIAGNOSIS — M5106 Intervertebral disc disorders with myelopathy, lumbar region: Secondary | ICD-10-CM | POA: Diagnosis not present

## 2018-12-19 NOTE — Telephone Encounter (Signed)
Left message for patient to call back. Patient has appt on 12/29/18, need to talk to patient about a virtual visit.

## 2018-12-24 NOTE — Telephone Encounter (Signed)
Virtual Visit Pre-Appointment Phone Call  "(Name), I am calling you today to discuss your upcoming appointment. We are currently trying to limit exposure to the virus that causes COVID-19 by seeing patients at home rather than in the office."  1. "What is the BEST phone number to call the day of the visit?" - include this in appointment notes  2. "Do you have or have access to (through a family member/friend) a smartphone with video capability that we can use for your visit?" a. If yes - list this number in appt notes as "cell" (if different from BEST phone #) and list the appointment type as a VIDEO visit in appointment notes b. If no - list the appointment type as a PHONE visit in appointment notes  3. Confirm consent - "In the setting of the current Covid19 crisis, you are scheduled for a (phone or video) visit with your provider on (date) at (time).  Just as we do with many in-office visits, in order for you to participate in this visit, we must obtain consent.  If you'd like, I can send this to your mychart (if signed up) or email for you to review.  Otherwise, I can obtain your verbal consent now.  All virtual visits are billed to your insurance company just like a normal visit would be.  By agreeing to a virtual visit, we'd like you to understand that the technology does not allow for your provider to perform an examination, and thus may limit your provider's ability to fully assess your condition. If your provider identifies any concerns that need to be evaluated in person, we will make arrangements to do so.  Finally, though the technology is pretty good, we cannot assure that it will always work on either your or our end, and in the setting of a video visit, we may have to convert it to a phone-only visit.  In either situation, we cannot ensure that we have a secure connection.  Are you willing to proceed? YES  4. Advise patient to be prepared - "Two hours prior to your appointment, go  ahead and check your blood pressure, pulse, oxygen saturation, and your weight (if you have the equipment to check those) and write them all down. When your visit starts, your provider will ask you for this information. If you have an Apple Watch or Kardia device, please plan to have heart rate information ready on the day of your appointment. Please have a pen and paper handy nearby the day of the visit as well."  5. Give patient instructions for MyChart download to smartphone OR [email or web address] as below if video visit (depending on what platform provider is using)  6. Inform patient they will receive a phone call 15 minutes prior to their appointment time (may be from unknown caller ID) so they should be prepared to answer    TELEPHONE CALL NOTE  ASAR EVILSIZER has been deemed a candidate for a follow-up tele-health visit to limit community exposure during the Covid-19 pandemic. I spoke with the patient via phone to ensure availability of phone/video source, confirm preferred email & phone number, and discuss instructions and expectations.  I reminded [name] to be prepared with any vital sign and/or heart rhythm information that could potentially be obtained via home monitoring, at the time of his visit. I reminded [name] to expect a phone call prior to his visit.  Michaelyn Barter, RN 12/24/2018 12:26 PM  IF USING  DOXIMITY or [email or web address].ME - The patient will receive a link just prior to their visit by text.     FULL LENGTH CONSENT FOR TELE-HEALTH VISIT   I hereby voluntarily request, consent and authorize [facility] and its employed or contracted physicians, physician assistants, nurse practitioners or other licensed health care professionals (the Practitioner), to provide me with telemedicine health care services (the "Services") as deemed necessary by the treating Practitioner. I acknowledge and consent to receive the Services by the Practitioner via telemedicine. I  understand that the telemedicine visit will involve communicating with the Practitioner through live audiovisual communication technology and the disclosure of certain medical information by electronic transmission. I acknowledge that I have been given the opportunity to request an in-person assessment or other available alternative prior to the telemedicine visit and am voluntarily participating in the telemedicine visit.  I understand that I have the right to withhold or withdraw my consent to the use of telemedicine in the course of my care at any time, without affecting my right to future care or treatment, and that the Practitioner or I may terminate the telemedicine visit at any time. I understand that I have the right to inspect all information obtained and/or recorded in the course of the telemedicine visit and may receive copies of available information for a reasonable fee.  I understand that some of the potential risks of receiving the Services via telemedicine include:  Marland Kitchen Delay or interruption in medical evaluation due to technological equipment failure or disruption; . Information transmitted may not be sufficient (e.g. poor resolution of images) to allow for appropriate medical decision making by the Practitioner; and/or  . In rare instances, security protocols could fail, causing a breach of personal health information.  Furthermore, I acknowledge that it is my responsibility to provide information about my medical history, conditions and care that is complete and accurate to the best of my ability. I acknowledge that Practitioner's advice, recommendations, and/or decision may be based on factors not within their control, such as incomplete or inaccurate data provided by me or distortions of diagnostic images or specimens that may result from electronic transmissions. I understand that the practice of medicine is not an exact science and that Practitioner makes no warranties or guarantees  regarding treatment outcomes. I acknowledge that I will receive a copy of this consent concurrently upon execution via email to the email address I last provided but may also request a printed copy by calling the office of [facility].    I understand that my insurance will be billed for this visit.   I have read or had this consent read to me. . I understand the contents of this consent, which adequately explains the benefits and risks of the Services being provided via telemedicine.  . I have been provided ample opportunity to ask questions regarding this consent and the Services and have had my questions answered to my satisfaction. . I give my informed consent for the services to be provided through the use of telemedicine in my medical care  By participating in this telemedicine visit I agree to the above.

## 2018-12-27 NOTE — Progress Notes (Signed)
Virtual Visit via Video Note   This visit type was conducted due to national recommendations for restrictions regarding the COVID-19 Pandemic (e.g. social distancing) in an effort to limit this patient's exposure and mitigate transmission in our community.  Due to his co-morbid illnesses, this patient is at least at moderate risk for complications without adequate follow up.  This format is felt to be most appropriate for this patient at this time.  All issues noted in this document were discussed and addressed.  A limited physical exam was performed with this format.  Please refer to the patient's chart for his consent to telehealth for [facility].   Date:  12/29/2018   ID:  [name], DOB [birth year], MRN [id number]  Patient Location: Home Provider Location: Office  PCP:  [name], MD  Cardiologist:  [name], MD   Electrophysiologist:  None   Evaluation Performed:  Follow-Up Visit  Chief Complaint:  CAD/ HLD   History of Present Illness:    63 y.o. 2010 had stenting proximal and mid LAD with DES by [name]. Non ischemic myovue 04/24/16 Had myalgias on crestor and lipitor was approved for Varicose veins post ablative Rx by [name]. Works at [employer] doing [employer] work  Some LE edema Has PRN HCTZ  Helping to raise 61 yo grandson   Recent visit with primary Declined flu shot Discussed this with him and encouraged getting it LDL was 97 on labs 09/18/18 He had been approved for PSK 9 Primary suggested low dose crestor 5 mg but currently not on any lipid lowering drug   Has some sciatica Rx with prednisone improved  Needs script for higher dose praluent  The patient does not have symptoms concerning for COVID-19 infection (fever, chills, cough, or new shortness of breath).    Past Medical History:  Diagnosis Date  . Arthritis of knee    seeing Dr. Alfonso Ramus  . Coronary artery disease   . Depression with anxiety    hx of  . Exogenous obesity   . History  of sleep apnea   . Hyperlipidemia   . Hypertension   . Panic attacks    hx of  . Smoker unmotivated to quit   . Varicose veins    Past Surgical History:  Procedure Laterality Date  . CARDIAC CATHETERIZATION  09/06/2008   stent to mid LAD and proximal LAD  . COLONOSCOPY N/A 05/01/2013   Procedure: COLONOSCOPY;  Surgeon: [name], MD;  [facility];  Service: Endoscopy;  Laterality: N/A;  8:30 AM  . polyps of lung  2008   removed by [facility]  . TONSILLECTOMY       Current Meds  Medication Sig  . Alirocumab (PRALUENT) 150 MG/ML SOAJ Inject 1 pen into the skin every 14 (fourteen) days.  Marland Kitchen aspirin EC 81 MG tablet Take 81 mg by mouth daily.  . clopidogrel (PLAVIX) 75 MG tablet TAKE 1 TABLET BY MOUTH DAILY  . hydrochlorothiazide (HYDRODIURIL) 25 MG tablet Take 1 tablet (25 mg total) by mouth daily as needed (edema).  Marland Kitchen HYDROcodone-acetaminophen (NORCO) 7.5-325 MG tablet Take 1 tablet by mouth every 6 (six) hours as needed for moderate pain.  Marland Kitchen losartan (COZAAR) 100 MG tablet TAKE 1 TABLET BY MOUTH EVERY DAY  . metoprolol succinate (TOPROL-XL) 25 MG 24 hr tablet TAKE 1/2 TABLET BY MOUTH EVERY DAY  . nitroGLYCERIN (NITROSTAT) 0.4 MG SL tablet Place 1 tablet (0.4 mg total) under the tongue every  5 (five) minutes as needed for chest pain.  Marland Kitchen PARoxetine (PAXIL) 40 MG tablet TAKE 1 TABLET(40 MG) BY MOUTH DAILY     Allergies:   Patient has no known allergies.   Social History   Tobacco Use  . Smoking status: Current Every Day Smoker    Packs/day: 1.00    Years: 40.00    Pack years: 40.00    Types: Cigarettes  . Smokeless tobacco: Never Used  Substance Use Topics  . Alcohol use: Yes    Alcohol/week: 0.0 standard drinks    Comment: occasionally  . Drug use: No     Family Hx: The patient's family history includes Cancer in his brother; Cancer (age of onset: 34) in his father; Heart disease in his father. There is no history of Colon cancer.  ROS:   Please  see the history of present illness.     All other systems reviewed and are negative.   Prior CV studies:   The following studies were reviewed today:  Myovue 04/24/16  Labs/Other Tests and Data Reviewed:    EKG:  10/31/17 SR rate 68 normal   Recent Labs: 09/18/2018: ALT 20; BUN 13; Creat 0.80; Hemoglobin 16.0; Platelets 170; Potassium 4.4; Sodium 141   Recent Lipid Panel Lab Results  Component Value Date/Time   CHOL 157 09/18/2018 08:53 AM   CHOL 151 12/31/2017 08:20 AM   TRIG 137 09/18/2018 08:53 AM   HDL 36 (L) 09/18/2018 08:53 AM   HDL 31 (L) 12/31/2017 08:20 AM   CHOLHDL 4.4 09/18/2018 08:53 AM   LDLCALC 97 09/18/2018 08:53 AM   LDLDIRECT 178 (H) 11/18/2017 04:28 PM    Wt Readings from Last 3 Encounters:  12/29/18 (!) 140.6 kg  09/18/18 (!) 144.2 kg  06/23/18 (!) 143.7 kg     Objective:    Vital Signs:  Ht 6\' 1"  (1.854 m)   Wt (!) 140.6 kg   BMI 40.90 kg/m    Skin warm and dry No distress No tachypnea No JVP elevation Mild edema RLE with varicosities   ASSESSMENT & PLAN:    1. CAD: stenting LAD 2010 non ischemic myovue 2017 no chest pain continue medical Rx 2. HLD:  LDL 97 has not had shot in a month will make sure script sent in  3. Obesity:  Discussed low carb diet and portion control  4. Bilateral varicose veins.  F/U [facility] post ablative Rx with chronic stasis referred back to [facility] Has PRN HCTZ  5. Ortho: post arthroscopic surgery on left with improvement f/u Dr Alain Marion     Disposition: Continue current medication.  F/u with me in 6 months    COVID-19 Education: The signs and symptoms of COVID-19 were discussed with the patient and how to seek care for testing (follow up with PCP or arrange E-visit).  The importance of social distancing was discussed today.  Time:   Today, I have spent 30 minutes with the patient with telehealth technology discussing the above problems.     Medication Adjustments/Labs and Tests Ordered: Current medicines are  reviewed at length with the patient today.  Concerns regarding medicines are outlined above.   Tests Ordered: No orders of the defined types were placed in this encounter.   Medication Changes: No orders of the defined types were placed in this encounter.   Disposition:  Follow up in 6 months   Signed, [name], MD  12/29/2018 8:00 AM    [place]

## 2018-12-29 ENCOUNTER — Other Ambulatory Visit: Payer: Self-pay

## 2018-12-29 ENCOUNTER — Telehealth: Payer: Self-pay | Admitting: Pharmacist

## 2018-12-29 ENCOUNTER — Encounter: Payer: Self-pay | Admitting: Cardiovascular Disease

## 2018-12-29 ENCOUNTER — Telehealth (INDEPENDENT_AMBULATORY_CARE_PROVIDER_SITE_OTHER): Payer: 59 | Admitting: Cardiovascular Disease

## 2018-12-29 VITALS — Ht 73.0 in | Wt 310.0 lb

## 2018-12-29 DIAGNOSIS — E782 Mixed hyperlipidemia: Secondary | ICD-10-CM

## 2018-12-29 DIAGNOSIS — I2510 Atherosclerotic heart disease of native coronary artery without angina pectoris: Secondary | ICD-10-CM | POA: Diagnosis not present

## 2018-12-29 MED ORDER — ALIROCUMAB 150 MG/ML ~~LOC~~ SOAJ: 1.0000 "pen " | SUBCUTANEOUS | 3 refills | Status: DC

## 2018-12-29 NOTE — Telephone Encounter (Signed)
Patient stated in televisit that he needed refill on Praluent. Dose was increased and new rx was sent to [facility] on 4/20. Called patient to clarifiy. Patient states he received a text that [facility] was working on it, but he hasnt received it. Encouraged patient to call them and see what the hold up was. I have sent over a new Rx just incase. Patient encouraged to call today or tomorrow to find out why he hasn't received it. Patient thanked me for the call.

## 2018-12-29 NOTE — Patient Instructions (Signed)

## 2019-01-01 ENCOUNTER — Other Ambulatory Visit: Payer: Self-pay | Admitting: Pharmacist

## 2019-05-25 ENCOUNTER — Other Ambulatory Visit: Payer: Self-pay | Admitting: Cardiovascular Disease

## 2019-05-26 ENCOUNTER — Telehealth: Payer: Self-pay

## 2019-05-26 NOTE — Telephone Encounter (Signed)
Called pt to set up OV with PN 9/30, left message asking pt to call the office.

## 2019-06-08 ENCOUNTER — Telehealth: Payer: Self-pay

## 2019-06-08 NOTE — Telephone Encounter (Signed)
Pt returned call to clinic - he changed back to [facility] as of October 1. New insurance information as follows:  ID: [id number] BIN: [id number] PCN: does not see one listed GRP: [id number]

## 2019-06-08 NOTE — Telephone Encounter (Signed)
Called and lmomed the pt instructed them to call back w/updated insurance information

## 2019-06-09 NOTE — Telephone Encounter (Signed)
Sent in a pa and awaiting to hear back from cmm

## 2019-06-10 MED ORDER — REPATHA SURECLICK 140 MG/ML ~~LOC~~ SOAJ: 140.0000 mg | SUBCUTANEOUS | 11 refills | Status: DC

## 2019-06-10 NOTE — Addendum Note (Signed)
Addended by: Allean Found on: 06/10/2019 11:59 AM   Modules accepted: Orders

## 2019-06-10 NOTE — Telephone Encounter (Signed)
Called the pt and let him know that repatha was approved and that the rx was sent to the pharmacy on file and to call us if there are any problems. Also instructed the pt to call us if the cost is unaffordable so that we can mail pt assistance forms

## 2019-06-10 NOTE — Telephone Encounter (Signed)
Praluent denied as Repatha is preferred on new insurance plan. PA for Repatha submitted. Awaiting response. Called patient and LVM to give update

## 2019-06-24 NOTE — Telephone Encounter (Signed)
Called and spoke w/ pt regarding copay card application. I instructed the pt to call the [phone] to apply for the copay card by phone. The pt voiced understanding and will call if they have questions

## 2019-06-24 NOTE — Telephone Encounter (Signed)
Called and notified the pt that they were approved for the $5 copay

## 2019-06-24 NOTE — Telephone Encounter (Signed)
Pt called clinic and stated Repatha copay is $35/month. He qualifies for $5 copay card [place], can you get a copay card activated and sent to his pharmacy please?

## 2019-07-31 DIAGNOSIS — M170 Bilateral primary osteoarthritis of knee: Secondary | ICD-10-CM | POA: Diagnosis not present

## 2019-11-02 ENCOUNTER — Telehealth (INDEPENDENT_AMBULATORY_CARE_PROVIDER_SITE_OTHER): Payer: BC Managed Care – PPO | Admitting: Family Medicine

## 2019-11-02 DIAGNOSIS — J0110 Acute frontal sinusitis, unspecified: Secondary | ICD-10-CM | POA: Diagnosis not present

## 2019-11-02 MED ORDER — AMOXICILLIN-POT CLAVULANATE 875-125 MG PO TABS: 1.0000 | ORAL_TABLET | Freq: Two times a day (BID) | ORAL | 0 refills | Status: DC

## 2019-11-02 MED ORDER — FLUTICASONE PROPIONATE 50 MCG/ACT NA SUSP: 2.0000 | Freq: Every day | NASAL | 6 refills | Status: DC

## 2019-11-02 NOTE — Progress Notes (Signed)
Subjective:    Patient ID: [name], male    DOB: 03-04-1956, 64 y.o.   MRN: [id number]  HPI Patient is being seen today as a telephone visit.  He consents to be seen via telephone.  Phone call began at 1118.  Phone call concluded at 1128.  Patient states that his symptoms began over a week ago.  He describes it as a very bad cold.  He reports stuffy nose and has noticed.  He reports rhinorrhea and nasal congestion that he is constantly having to blow out.  He reports sinus pressure particularly in his frontal sinuses and sinus pain in his frontal sinuses.  He has been sneezing with postnasal drip male for more than a week.  He also reports a cough due to the postnasal drip.  He reports reports pressure over his eyes.  He reports no shortness of breath or chest pain.  He reports no fever.  He has not lost his sense of smell or taste. Past Medical History:  Diagnosis Date  . Arthritis of knee    seeing Dr. Alfonso Ramus  . Coronary artery disease   . Depression with anxiety    hx of  . Exogenous obesity   . History of sleep apnea   . Hyperlipidemia   . Hypertension   . Panic attacks    hx of  . Smoker unmotivated to quit   . Varicose veins    Past Surgical History:  Procedure Laterality Date  . CARDIAC CATHETERIZATION  09/06/2008   stent to mid LAD and proximal LAD  . COLONOSCOPY N/A 05/01/2013   Procedure: COLONOSCOPY;  Surgeon: [name], MD;  [facility];  Service: Endoscopy;  Laterality: N/A;  8:30 AM  . polyps of lung  2008   removed by [facility]  . TONSILLECTOMY     Current Outpatient Medications on File Prior to Visit  Medication Sig Dispense Refill  . aspirin EC 81 MG tablet Take 81 mg by mouth daily.    . clopidogrel (PLAVIX) 75 MG tablet TAKE 1 TABLET BY MOUTH DAILY 90 tablet 2  . Evolocumab (REPATHA SURECLICK) 177 MG/ML SOAJ Inject 140 mg into the skin every 14 (fourteen) days. 2 pen 11  . hydrochlorothiazide (HYDRODIURIL) 25 MG tablet Take 1  tablet (25 mg total) by mouth daily as needed (edema). 30 tablet 11  . HYDROcodone-acetaminophen (NORCO) 7.5-325 MG tablet Take 1 tablet by mouth every 6 (six) hours as needed for moderate pain. 30 tablet 0  . losartan (COZAAR) 100 MG tablet TAKE 1 TABLET BY MOUTH EVERY DAY 90 tablet 2  . metoprolol succinate (TOPROL-XL) 25 MG 24 hr tablet TAKE 1/2 TABLET BY MOUTH EVERY DAY 45 tablet 2  . nitroGLYCERIN (NITROSTAT) 0.4 MG SL tablet Place 1 tablet (0.4 mg total) under the tongue every 5 (five) minutes as needed for chest pain. 25 tablet 3  . PARoxetine (PAXIL) 40 MG tablet TAKE 1 TABLET(40 MG) BY MOUTH DAILY 90 tablet 3   No current facility-administered medications on file prior to visit.   No Known Allergies Social History   Socioeconomic History  . Marital status: Married    Spouse name: Not on file  . Number of children: Not on file  . Years of education: Not on file  . Highest education level: Not on file  Occupational History  . Occupation: [employer]  Tobacco Use  . Smoking status: Current Every Day Smoker    Packs/day: 1.00  Years: 40.00    Pack years: 40.00    Types: Cigarettes  . Smokeless tobacco: Never Used  Substance and Sexual Activity  . Alcohol use: Yes    Alcohol/week: 0.0 standard drinks    Comment: occasionally  . Drug use: No  . Sexual activity: Not on file  Other Topics Concern  . 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   Social Determinants of Health   Financial Resource Strain:   . Difficulty of Paying Living Expenses: Not on file  Food Insecurity:   . Worried About Charity fundraiser in the Last Year: Not on file  . Ran Out of Food in the Last Year: Not on file  Transportation Needs:   . Lack of Transportation (Medical): Not on file  . Lack of Transportation (Non-Medical): Not on file  Physical Activity:   . Days of Exercise per Week: Not on file  . Minutes of Exercise per Session: Not on file    Stress:   . Feeling of Stress : Not on file  Social Connections:   . Frequency of Communication with Friends and Family: Not on file  . Frequency of Social Gatherings with Friends and Family: Not on file  . Attends Religious Services: Not on file  . Active Member of Clubs or Organizations: Not on file  . Attends Archivist Meetings: Not on file  . Marital Status: Not on file  Intimate Partner Violence:   . Fear of Current or Ex-Partner: Not on file  . Emotionally Abused: Not on file  . Physically Abused: Not on file  . Sexually Abused: Not on file      Review of Systems  All other systems reviewed and are negative.      Objective:   Physical Exam        Assessment & Plan:  Acute frontal sinusitis, recurrence not specified - Plan: SARS-COV-2 RNA,(COVID-19) QUAL NAAT  Patient sounds extremely congested over the telephone.  Symptoms are consistent with sinus infection.  I will treat the patient with Augmentin 875 mg p.o. twice daily for 10 days and Flonase 2 sprays each nostril daily.  Also given the current pandemic, I would recommend Covid testing.  He can come by my office anytime today and I will go out to his vehicle and swab him while he sits in his vehicle.

## 2019-11-03 LAB — SARS-COV-2 RNA,(COVID-19) QUALITATIVE NAAT: SARS CoV2 RNA: NOT DETECTED

## 2019-12-03 DIAGNOSIS — M170 Bilateral primary osteoarthritis of knee: Secondary | ICD-10-CM | POA: Diagnosis not present

## 2019-12-08 ENCOUNTER — Encounter: Payer: Self-pay | Admitting: Family Medicine

## 2019-12-08 ENCOUNTER — Other Ambulatory Visit: Payer: Self-pay

## 2019-12-08 ENCOUNTER — Ambulatory Visit (INDEPENDENT_AMBULATORY_CARE_PROVIDER_SITE_OTHER): Payer: BC Managed Care – PPO | Admitting: Family Medicine

## 2019-12-08 VITALS — BP 126/80 | HR 58 | Temp 97.6°F | Resp 18 | Ht 73.0 in | Wt 303.0 lb

## 2019-12-08 DIAGNOSIS — Z0000 Encounter for general adult medical examination without abnormal findings: Secondary | ICD-10-CM | POA: Diagnosis not present

## 2019-12-08 DIAGNOSIS — Z125 Encounter for screening for malignant neoplasm of prostate: Secondary | ICD-10-CM | POA: Diagnosis not present

## 2019-12-08 DIAGNOSIS — E7800 Pure hypercholesterolemia, unspecified: Secondary | ICD-10-CM | POA: Diagnosis not present

## 2019-12-08 DIAGNOSIS — Z0001 Encounter for general adult medical examination with abnormal findings: Secondary | ICD-10-CM

## 2019-12-08 DIAGNOSIS — I10 Essential (primary) hypertension: Secondary | ICD-10-CM | POA: Diagnosis not present

## 2019-12-08 DIAGNOSIS — I259 Chronic ischemic heart disease, unspecified: Secondary | ICD-10-CM

## 2019-12-08 LAB — CBC WITH DIFFERENTIAL/PLATELET
Absolute Monocytes: 523 cells/uL (ref 200–950)
Basophils Absolute: 83 cells/uL (ref 0–200)
Basophils Relative: 1 %
Eosinophils Absolute: 183 cells/uL (ref 15–500)
Eosinophils Relative: 2.2 %
HCT: 48.3 % (ref 38.5–50.0)
Hemoglobin: 16.4 g/dL (ref 13.2–17.1)
Lymphs Abs: 2590 cells/uL (ref 850–3900)
MCH: 33.4 pg — ABNORMAL HIGH (ref 27.0–33.0)
MCHC: 34 g/dL (ref 32.0–36.0)
MCV: 98.4 fL (ref 80.0–100.0)
MPV: 8.9 fL (ref 7.5–12.5)
Monocytes Relative: 6.3 %
Neutro Abs: 4922 cells/uL (ref 1500–7800)
Neutrophils Relative %: 59.3 %
Platelets: 180 10*3/uL (ref 140–400)
RBC: 4.91 10*6/uL (ref 4.20–5.80)
RDW: 11.9 % (ref 11.0–15.0)
Total Lymphocyte: 31.2 %
WBC: 8.3 10*3/uL (ref 3.8–10.8)

## 2019-12-08 LAB — COMPLETE METABOLIC PANEL WITH GFR
AG Ratio: 1.9 (calc) (ref 1.0–2.5)
ALT: 16 U/L (ref 9–46)
AST: 13 U/L (ref 10–35)
Albumin: 4.4 g/dL (ref 3.6–5.1)
Alkaline phosphatase (APISO): 58 U/L (ref 35–144)
BUN: 14 mg/dL (ref 7–25)
CO2: 27 mmol/L (ref 20–32)
Calcium: 9.5 mg/dL (ref 8.6–10.3)
Chloride: 105 mmol/L (ref 98–110)
Creat: 0.8 mg/dL (ref 0.70–1.25)
GFR, Est African American: 112 mL/min/{1.73_m2} (ref >=60)
GFR, Est Non African American: 96 mL/min/{1.73_m2} (ref >=60)
Globulin: 2.3 g/dL (calc) (ref 1.9–3.7)
Glucose, Bld: 96 mg/dL (ref 65–99)
Potassium: 4.4 mmol/L (ref 3.5–5.3)
Sodium: 140 mmol/L (ref 135–146)
Total Bilirubin: 0.4 mg/dL (ref 0.2–1.2)
Total Protein: 6.7 g/dL (ref 6.1–8.1)

## 2019-12-08 LAB — LIPID PANEL
Cholesterol: 135 mg/dL (ref <200)
HDL: 40 mg/dL (ref >=40)
LDL Cholesterol (Calc): 79 mg/dL (calc)
Non-HDL Cholesterol (Calc): 95 mg/dL (calc) (ref <130)
Total CHOL/HDL Ratio: 3.4 (calc) (ref <5.0)
Triglycerides: 80 mg/dL (ref <150)

## 2019-12-08 LAB — PSA: PSA: 0.3 ng/mL (ref <=4.0)

## 2019-12-08 NOTE — Progress Notes (Signed)
Subjective:    Patient ID: [name], male    DOB: Mar 11, 1956, 64 y.o.   MRN: [id number]  HPI  Patient is here today for complete physical exam.  His last colonoscopy was 2014 and was normal.  PSA was normal last year as was Hep C screen.  Has 48 pack year history of smoking and CT of the chest was negative last year.  Patient has lost weight and is down from 317-3 03.  Unfortunately continues to smoke.  We discussed Chantix and at the present time patient is not interested in trying Chantix.  He is due for the Covid vaccination.  He is due for Shingrix.  Otherwise he is doing well with no concerns.  Blood pressure today is outstanding at 126/80. Past Medical History:  Diagnosis Date  . Arthritis of knee    seeing Dr. Alfonso Ramus  . Coronary artery disease   . Depression with anxiety    hx of  . Exogenous obesity   . History of sleep apnea   . Hyperlipidemia   . Hypertension   . Panic attacks    hx of  . Smoker unmotivated to quit   . Varicose veins    Past Surgical History:  Procedure Laterality Date  . CARDIAC CATHETERIZATION  09/06/2008   stent to mid LAD and proximal LAD  . COLONOSCOPY N/A 05/01/2013   Procedure: COLONOSCOPY;  Surgeon: [name], MD;  [facility];  Service: Endoscopy;  Laterality: N/A;  8:30 AM  . polyps of lung  2008   removed by [facility]  . TONSILLECTOMY     Current Outpatient Medications on File Prior to Visit  Medication Sig Dispense Refill  . aspirin EC 81 MG tablet Take 81 mg by mouth daily.    . clopidogrel (PLAVIX) 75 MG tablet TAKE 1 TABLET BY MOUTH DAILY 90 tablet 2  . Evolocumab (REPATHA SURECLICK) 086 MG/ML SOAJ Inject 140 mg into the skin every 14 (fourteen) days. 2 pen 11  . fluticasone (FLONASE) 50 MCG/ACT nasal spray Place 2 sprays into both nostrils daily. 16 g 6  . hydrochlorothiazide (HYDRODIURIL) 25 MG tablet Take 1 tablet (25 mg total) by mouth daily as needed (edema). 30 tablet 11  .  HYDROcodone-acetaminophen (NORCO) 7.5-325 MG tablet Take 1 tablet by mouth every 6 (six) hours as needed for moderate pain. 30 tablet 0  . losartan (COZAAR) 100 MG tablet TAKE 1 TABLET BY MOUTH EVERY DAY 90 tablet 2  . metoprolol succinate (TOPROL-XL) 25 MG 24 hr tablet TAKE 1/2 TABLET BY MOUTH EVERY DAY 45 tablet 2  . nitroGLYCERIN (NITROSTAT) 0.4 MG SL tablet Place 1 tablet (0.4 mg total) under the tongue every 5 (five) minutes as needed for chest pain. 25 tablet 3  . PARoxetine (PAXIL) 40 MG tablet TAKE 1 TABLET(40 MG) BY MOUTH DAILY 90 tablet 3   No current facility-administered medications on file prior to visit.   No Known Allergies Social History   Socioeconomic History  . Marital status: Married    Spouse name: Not on file  . Number of children: Not on file  . Years of education: Not on file  . Highest education level: Not on file  Occupational History  . Occupation: [employer]  Tobacco Use  . Smoking status: Current Every Day Smoker    Packs/day: 1.00    Years: 40.00    Pack years: 40.00    Types: Cigarettes  . Smokeless tobacco: Never Used  Substance  and Sexual Activity  . Alcohol use: Yes    Alcohol/week: 0.0 standard drinks    Comment: occasionally  . Drug use: No  . Sexual activity: Not on file  Other Topics Concern  . 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   Social Determinants of Health   Financial Resource Strain:   . Difficulty of Paying Living Expenses:   Food Insecurity:   . Worried About Charity fundraiser in the Last Year:   . Arboriculturist in the Last Year:   Transportation Needs:   . Film/video editor (Medical):   Marland Kitchen Lack of Transportation (Non-Medical):   Physical Activity:   . Days of Exercise per Week:   . Minutes of Exercise per Session:   Stress:   . Feeling of Stress :   Social Connections:   . Frequency of Communication with Friends and Family:   . Frequency of Social Gatherings  with Friends and Family:   . Attends Religious Services:   . Active Member of Clubs or Organizations:   . Attends Archivist Meetings:   Marland Kitchen Marital Status:   Intimate Partner Violence:   . Fear of Current or Ex-Partner:   . Emotionally Abused:   Marland Kitchen Physically Abused:   . Sexually Abused:     Family History  Problem Relation Age of Onset  . Heart disease Father        had cabg  . Cancer Father 51       esophageal  . Cancer Brother        lung cancer  . Colon cancer Neg Hx       Review of Systems     Objective:   Physical Exam  Constitutional: He is oriented to person, place, and time. He appears well-developed and well-nourished. No distress.  HENT:  Head: Normocephalic and atraumatic.  Right Ear: Tympanic membrane, external ear and ear canal normal.  Left Ear: Tympanic membrane, external ear and ear canal normal.  Nose: Nose normal.  Mouth/Throat: Oropharynx is clear and moist. No oropharyngeal exudate.  Eyes: Pupils are equal, round, and reactive to light. Conjunctivae and EOM are normal. Right eye exhibits no discharge. Left eye exhibits no discharge. No scleral icterus.  Neck: No JVD present. No tracheal deviation present. No thyromegaly present.  Cardiovascular: Normal rate, regular rhythm and normal heart sounds. Exam reveals no gallop and no friction rub.  No murmur heard. Pulmonary/Chest: Effort normal and breath sounds normal. No stridor. No respiratory distress. He has no decreased breath sounds. He has no wheezes. He has no rales. He exhibits no tenderness.  Abdominal: Soft. Bowel sounds are normal. He exhibits no distension and no mass. There is no abdominal tenderness. There is no rebound and no guarding.  Musculoskeletal:        General: No tenderness, deformity or edema. Normal range of motion.     Cervical back: Normal range of motion and neck supple.  Lymphadenopathy:    He has no cervical adenopathy.  Neurological: He is alert and oriented to  person, place, and time. He has normal reflexes. No cranial nerve deficit. He exhibits normal muscle tone. Coordination normal.  Skin: Skin is warm. No rash noted. He is not diaphoretic. No erythema. No pallor.  Psychiatric: He has a normal mood and affect. His behavior is normal. Judgment and thought content normal.  Vitals reviewed.       Assessment &  Plan:  General medical exam - Plan: CBC with Differential/Platelet, COMPLETE METABOLIC PANEL WITH GFR, Lipid panel, PSA  Ischemic heart disease - Plan: CBC with Differential/Platelet, COMPLETE METABOLIC PANEL WITH GFR, Lipid panel  Prostate cancer screening - Plan: PSA  Hypercholesterolemia - Plan: CBC with Differential/Platelet, COMPLETE METABOLIC PANEL WITH GFR, Lipid panel  Essential hypertension - Plan: CBC with Differential/Platelet, COMPLETE METABOLIC PANEL WITH GFR, Lipid panel  Strongly recommended Covid vaccination.  Provided contact information on how to schedule that.  Blood pressure today is excellent.  I will check a CBC, CMP, fasting lipid panel.  Goal LDL cholesterol be less than 70 given his history of cardiac stents.  I will screen for prostate cancer with a PSA.  Blood pressure today is excellent.  Colon cancer screening is up-to-date.  I did also recommended shingles vaccine however I would defer the shingles vaccine until after he receives Covid vaccination.  We discussed lung cancer screening.  Patient defers this till next year.  Denies any depression.

## 2019-12-15 NOTE — Progress Notes (Addendum)
Date:  12/23/2019   ID:  [name], [name] [birth year], MRN [id number]  PCP:  [name], MD  Cardiologist:  [name], MD   Electrophysiologist:  None   Evaluation Performed:  Follow-Up Visit  Chief Complaint:  CAD/ HLD   History of Present Illness:    64 y.o. 2010 had stenting proximal and mid LAD with DES by [name]. Non ischemic myovue 04/24/16 Had myalgias on crestor and lipitor Now on Repatha  Varicose veins post ablative Rx by [name]. 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    Past Medical History:  Diagnosis Date  . Arthritis of knee    seeing Dr. Alfonso Ramus  . Coronary artery disease   . Depression with anxiety    hx of  . Exogenous obesity   . History of sleep apnea   . Hyperlipidemia   . Hypertension   . Panic attacks    hx of  . Smoker unmotivated to quit   . Varicose veins    Past Surgical History:  Procedure Laterality Date  . CARDIAC CATHETERIZATION  09/06/2008   stent to mid LAD and proximal LAD  . COLONOSCOPY N/A 05/01/2013   Procedure: COLONOSCOPY;  Surgeon: [name], MD;  [facility];  Service: Endoscopy;  Laterality: N/A;  8:30 AM  . polyps of lung  2008   removed by [facility]  . TONSILLECTOMY       No outpatient medications have been marked as taking for the 12/23/19 encounter (Office Visit) with Josue Hector, MD.     Allergies:   Patient has no known allergies.   Social History   Tobacco Use  . Smoking status: Current Every Day Smoker    Packs/day: 1.00    Years: 40.00    Pack years: 40.00    Types: Cigarettes  . Smokeless tobacco: Never  Used  Substance Use Topics  . Alcohol use: Yes    Alcohol/week: 0.0 standard drinks    Comment: occasionally  . Drug use: No     Family Hx: The patient's family history includes Cancer in his brother; Cancer (age of onset: 26) in his father; Heart disease in his father. There is no history of Colon cancer.  ROS:   Please see the history of present illness.     All other systems reviewed and are negative.   Prior CV studies:   The following studies were reviewed today:  Myovue 04/24/16  Labs/Other Tests and Data Reviewed:    EKG:  10/31/17 SR rate 68 normal  12/22/19 SR rate 59 normal   Recent Labs: 12/08/2019: ALT 16; BUN 14; Creat 0.80; Hemoglobin 16.4; Platelets 180; Potassium 4.4; Sodium 140   Recent Lipid Panel Lab Results  Component Value Date/Time   CHOL 135 12/08/2019 09:58 AM   CHOL 151 12/31/2017 08:20 AM   TRIG 80 12/08/2019 09:58 AM   HDL 40 12/08/2019 09:58 AM   HDL 31 (L) 12/31/2017 08:20 AM   CHOLHDL 3.4 12/08/2019 09:58 AM   LDLCALC 79 12/08/2019 09:58 AM   LDLDIRECT 178 (H)  11/18/2017 04:28 PM    Wt Readings from Last 3 Encounters:  12/23/19 (!) 302 lb (137 kg)  12/08/19 (!) 303 lb (137.4 kg)  12/29/18 (!) 310 lb (140.6 kg)     Objective:    Vital Signs:  BP 128/72   Pulse (!) 59   Ht 6\' 1"  (1.854 m)   Wt (!) 302 lb (137 kg)   SpO2 98%   BMI 39.84 kg/m    Affect appropriate Obese white male  HEENT: normal Neck supple with no adenopathy JVP normal no bruits no thyromegaly Lungs clear with no wheezing and good diaphragmatic motion Heart:  S1/S2 no murmur, no rub, gallop or click PMI normal Abdomen: benighn, BS positve, no tenderness, no AAA no bruit.  No HSM or HJR Distal pulses intact with no bruits LE varicosities right >left with edema and stasis  Neuro non-focal Skin warm and dry No muscular weakness   ASSESSMENT & PLAN:    1. CAD: stenting LAD 2010 non ischemic myovue 2017 no chest pain continue medical Rx 2. HLD:  LDL 79  continue Repatha   3. Obesity:  Discussed low carb diet and portion control  4. Bilateral varicose veins.  F/U VVS post ablative Rx with chronic stasis referred back to [facility]  Has PRN HCTZ  5. Ortho: post arthroscopic surgery on left with improvement f/u Dr Alain Marion  6. Smoking:  Lung cancer screening CT negative 10/08/18 update ordered   Medication Adjustments/Labs and Tests Ordered: Current medicines are reviewed at length with the patient today.  Concerns regarding medicines are outlined above.   Tests Ordered:  Lung cancer screening CT   Medication Changes: No orders of the defined types were placed in this encounter.   Disposition:  Follow up in a year   Signed, [name], MD  12/23/2019 8:25 AM    [place]

## 2019-12-23 ENCOUNTER — Encounter: Payer: Self-pay | Admitting: Cardiovascular Disease

## 2019-12-23 ENCOUNTER — Ambulatory Visit: Payer: BC Managed Care – PPO | Admitting: Cardiovascular Disease

## 2019-12-23 ENCOUNTER — Other Ambulatory Visit: Payer: Self-pay

## 2019-12-23 VITALS — BP 128/72 | HR 59 | Ht 73.0 in | Wt 302.0 lb

## 2019-12-23 DIAGNOSIS — E785 Hyperlipidemia, unspecified: Secondary | ICD-10-CM

## 2019-12-23 DIAGNOSIS — I2510 Atherosclerotic heart disease of native coronary artery without angina pectoris: Secondary | ICD-10-CM

## 2019-12-23 DIAGNOSIS — Z87891 Personal history of nicotine dependence: Secondary | ICD-10-CM | POA: Diagnosis not present

## 2019-12-23 NOTE — Patient Instructions (Signed)
Medication Instructions:  *If you need a refill on your cardiac medications before your next appointment, please call your pharmacy*  Lab Work: If you have labs (blood work) drawn today and your tests are completely normal, you will receive your results only by: Marland Kitchen MyChart Message (if you have MyChart) OR . A paper copy in the mail If you have any lab test that is abnormal or we need to change your treatment, we will call you to review the results.  Testing/Procedures: Non-Cardiac CT scanning lung cancer screening, (CAT scanning), is a noninvasive, special x-ray that produces cross-sectional images of the body using x-rays and a computer. CT scans help physicians diagnose and treat medical conditions. For some CT exams, a contrast material is used to enhance visibility in the area of the body being studied. CT scans provide greater clarity and reveal more details than regular x-ray exams.  Follow-Up: At [facility], you and your health needs are our priority.  As part of our continuing mission to provide you with exceptional heart care, we have created designated Provider Care Teams.  These Care Teams include your primary Cardiologist (physician) and Advanced Practice Providers (APPs -  Physician Assistants and Nurse Practitioners) who all work together to provide you with the care you need, when you need it.  We recommend signing up for the patient portal called "MyChart".  Sign up information is provided on this After Visit Summary.  MyChart is used to connect with patients for Virtual Visits (Telemedicine).  Patients are able to view lab/test results, encounter notes, upcoming appointments, etc.  Non-urgent messages can be sent to your provider as well.   To learn more about what you can do with MyChart, go to [email or web address].    Your next appointment:   1 year(s)  The format for your next appointment:   In Person  Provider:   You may see [name], MD or one of the following  Advanced Practice Providers on your designated Care Team:    Truitt Merle, NP  Cecilie Kicks, NP  Kathyrn Drown, NP

## 2020-01-12 ENCOUNTER — Other Ambulatory Visit: Payer: Self-pay | Admitting: Family Medicine

## 2020-01-12 ENCOUNTER — Other Ambulatory Visit: Payer: Self-pay | Admitting: Cardiovascular Disease

## 2020-01-12 DIAGNOSIS — F410 Panic disorder [episodic paroxysmal anxiety] without agoraphobia: Secondary | ICD-10-CM

## 2020-01-13 ENCOUNTER — Ambulatory Visit (INDEPENDENT_AMBULATORY_CARE_PROVIDER_SITE_OTHER)
Admission: RE | Admit: 2020-01-13 | Discharge: 2020-01-13 | Disposition: A | Discharge status: Home / Self Care | Payer: BC Managed Care – PPO | Source: Ambulatory Visit | Attending: Cardiovascular Disease | Admitting: Cardiovascular Disease

## 2020-01-13 DIAGNOSIS — Z87891 Personal history of nicotine dependence: Secondary | ICD-10-CM | POA: Diagnosis not present

## 2020-01-13 DIAGNOSIS — E785 Hyperlipidemia, unspecified: Secondary | ICD-10-CM

## 2020-01-13 DIAGNOSIS — I2510 Atherosclerotic heart disease of native coronary artery without angina pectoris: Secondary | ICD-10-CM

## 2020-01-14 ENCOUNTER — Telehealth: Payer: Self-pay | Admitting: Cardiovascular Disease

## 2020-01-14 DIAGNOSIS — R911 Solitary pulmonary nodule: Secondary | ICD-10-CM

## 2020-01-14 NOTE — Telephone Encounter (Signed)
Pam he needs to see pulmonary ASAP likely has lung cancer

## 2020-01-14 NOTE — Telephone Encounter (Signed)
[facility] calling to give results on patients CT Lung Screening.

## 2020-01-14 NOTE — Telephone Encounter (Signed)
Received call from radiologist reporting abnormal chest CT result --  8 mm mixed attenuation lesion in the posterior left lower lobe has markedly progressed in the interval. Lung-RADS 4B, suspicious. Additional imaging evaluation or consultation with Pulmonology or Thoracic Surgery recommended.  Will forward to ordering provider and covering nurse to address.

## 2020-01-15 NOTE — Telephone Encounter (Signed)
Referral was sent to our office . Nothing else further needed.

## 2020-01-15 NOTE — Telephone Encounter (Signed)
Called patient to let him know that CT was abnormal and likely lung cancer. Informed patient that a referral has been placed for pulmonary and they will call him to set up appointment. Patient verbalized understanding.

## 2020-01-15 NOTE — Telephone Encounter (Signed)
Will route to pulmonary triage team to contact patient and arrange for consult visit.  To pulmonary triage team: please schedule consult visit with either Dr. Lamonte Sakai or Dr. Valeta Harms.

## 2020-01-20 ENCOUNTER — Encounter: Payer: Self-pay | Admitting: Internal Medicine

## 2020-01-20 ENCOUNTER — Other Ambulatory Visit: Payer: Self-pay

## 2020-01-20 ENCOUNTER — Other Ambulatory Visit (HOSPITAL_COMMUNITY)
Admission: RE | Admit: 2020-01-20 | Discharge: 2020-01-20 | Disposition: A | Discharge status: Home / Self Care | Payer: BC Managed Care – PPO | Source: Ambulatory Visit | Attending: Internal Medicine | Admitting: Internal Medicine

## 2020-01-20 ENCOUNTER — Ambulatory Visit: Payer: BC Managed Care – PPO | Admitting: Internal Medicine

## 2020-01-20 VITALS — BP 124/70 | HR 58 | Temp 98.1°F | Ht 73.0 in | Wt 307.4 lb

## 2020-01-20 DIAGNOSIS — Z01812 Encounter for preprocedural laboratory examination: Secondary | ICD-10-CM | POA: Diagnosis not present

## 2020-01-20 DIAGNOSIS — R911 Solitary pulmonary nodule: Secondary | ICD-10-CM

## 2020-01-20 DIAGNOSIS — Z20822 Contact with and (suspected) exposure to covid-19: Secondary | ICD-10-CM | POA: Diagnosis not present

## 2020-01-20 DIAGNOSIS — J432 Centrilobular emphysema: Secondary | ICD-10-CM | POA: Diagnosis not present

## 2020-01-20 DIAGNOSIS — F17210 Nicotine dependence, cigarettes, uncomplicated: Secondary | ICD-10-CM

## 2020-01-20 DIAGNOSIS — Z716 Tobacco abuse counseling: Secondary | ICD-10-CM

## 2020-01-20 LAB — SARS CORONAVIRUS 2 (TAT 6-24 HRS): SARS Coronavirus 2: NEGATIVE

## 2020-01-20 NOTE — Progress Notes (Addendum)
[name]    [id number]    1956-07-26  Primary Care Physician:Pickard, Cammie Mcgee, MD  Referring Physician: Josue Hector, MD [phone] [address] [place],  [place] [address] Reason for Consultation: abnormal CT Date of Consultation: 01/20/2020  Chief complaint:   Chief Complaint  Patient presents with  . Consult    follow up on CT scan      HPI: [name] is a 64 y.o. with history of CAD s/p PCI in 2010.  Had routine LDCT for lung cancer screening which showed SPN.  [name] had a follow-up scan this year which showed some evolution of a left lower lobe cavitating [name] nodule.  [name] presents today at the referral [name] for further evaluation.  [name] denies any fevers chills night sweats or unintentional weight loss.  [name] is not having any hemoptysis.  [name] is independent with ADLs and does not have any dyspnea.  [name] does not take any inhalers.  [name] has a history of hemoptysis in 2008 and had a CT angio which showed bronchitis.  The patient notes that [name] had a bronchoscopy which showed some kind of endobronchial lesion that was removed with a snare.  Unfortunately I do not have those procedure notes available for review but it does not appear [name] had any kind of surgery at that time.  I am was able to review the CT angio from 2018 which showed bronchitis involving pneumonia in the right middle lobe.  Since then [name] notes his breathing has been fine and has had occasional bouts of bronchitis including 1 in the last year that required prednisone.  No hospitalizations or ED visits for his breathing.  [name] has OSA and wears a CPAP since 1999.   Smoking history: 1 ppd. [name]'s quit in the past for about year when his daughter was born - cold [place]. Picked it back up with drinking and relaxing on vacation.   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   Social History   Occupational History  . Occupation: [employer]  Tobacco Use  . Smoking  status: Current Every Day Smoker    Packs/day: 1.00    Years: 40.00    Pack years: 40.00    Types: Cigarettes    Start date: 46  . Smokeless tobacco: Never Used  . Tobacco comment: smokes 10 cigarettes  01/20/20 [facility]   Substance and Sexual Activity  . Alcohol use: Yes    Alcohol/week: 0.0 standard drinks    Comment: occasionally  . Drug use: No  . Sexual activity: Not on file    Relevant family history:  Family History  Problem Relation Age of Onset  . Heart disease Father        had cabg  . Esophageal cancer Father 43       esophageal  . Lung cancer Brother        lung cancer  . Colon cancer Neg Hx     Past Medical History:  Diagnosis Date  . Arthritis of knee    seeing Dr. Alfonso [name]  . Coronary artery disease   . Depression with anxiety    hx of  . Exogenous obesity   . History of sleep apnea   . Hyperlipidemia   . Hypertension   . Panic attacks    hx of  . Smoker unmotivated to quit   . Varicose veins     Past Surgical History:  Procedure Laterality Date  . CARDIAC CATHETERIZATION  09/06/2008   stent to mid LAD and proximal LAD  . COLONOSCOPY N/A 05/01/2013   Procedure: COLONOSCOPY;  Surgeon: [name] Binder, MD;  [facility];  Service: Endoscopy;  Laterality: N/A;  8:30 AM  . polyps of lung  2008   removed by [name]  . TONSILLECTOMY       Physical Exam: Blood pressure 124/70, pulse (!) 58, temperature 98.1 F (36.7 C), temperature source Oral, height 6\' 1"  (1.854 m), weight (!) 307 lb 6.4 oz (139.4 kg), SpO2 94 %. Gen:      No acute distress ENT:  no nasal polyps, mucus membranes moist Lungs:    No increased respiratory effort, symmetric chest wall excursion, clear to auscultation bilaterally, no wheezes or crackles CV:         Regular rate and rhythm; no murmurs, rubs, or gallops.  No pedal edema Abd:     Obese, soft + bowel sounds; non-tender; no distension MSK: chronic bilateral venous stasis, no acute synovitis of DIP or PIP  joints, no mechanics hands.  Skin:      Warm and dry; no rashes Neuro: normal speech, no focal facial asymmetry Psych: alert and oriented x3, normal mood and affect   Data Reviewed/Medical Decision Making:  Independent interpretation of tests: Imaging: . Review of patient's CT Chest images compared from last year to this year revealed stable centrilobular emphysema.  There is a left lower lobe cavitating nodule visualized in 2020 scan that has evolved this year with a more solid component there is no significant lymphadenopathy.. The patient's images have been independently reviewed by me.    PFTs:  Not on file  Labs:    Immunization status:  Immunization History  Administered Date(s) Administered  . Pneumococcal Polysaccharide-23 12/21/2015  . Tdap 02/25/2013    . I reviewed prior external note(s) from  . I reviewed the result(s) of the labs and imaging as noted above.  . I have ordered PFTs, pet scan  Discussion of management or test interpretation with another colleague in radiology.   Assessment:  Solitary [name] Nodule with progression Tobacco use disorder Centrilobular emphysema  Plan/Recommendations: [name] has had progression of this left lower lobe nodule with a progression of a solid component as well.  Given his risk factors for malignancy and ongoing tobacco use disorder, I am concerned for malignancy.  There is not appear to be any lymphadenopathy.  However this is a noncontrast CT scan.  I think it is useful to obtain a PET scan to ensure there is no hypermetabolic activity elsewhere.  If there is not I think it would be best to proceed with thoracic surgery referral for possible resection.  We will get some [name] function testing to help his recertification to tolerate thoracic surgery as well as establish a baseline for centrilobular emphysema.  [name] does have any PET avid lymphadenopathy may be useful to proceed with bronchoscopy and endobronchial  ultrasound with one of myself or one of my colleagues. I order the PET scan and PFTs today.  I told him I would contact him with the results of the PET scan as soon as we have more the results to plan next steps.  We discussed disease management and progression at length today.   I personally spent 7  minutes counseling the patient regarding tobacco use disorder.  Patient is symptomatic from tobacco use disorder due to the following condition: emphysema.  The patient's response  was pre-contemplative.  We discussed nicotine replacement therapy, Wellbutrin, Chantix.  We identified to gather patient specific barriers to change.  The patient is open to future discussions about tobacco cessation.   I spent 60 minutes in the care of this patient today including pre-charting, chart review, review of results, face-to-face care, coordination of care and communication with consultants etc.).  Fleischner Society Guidelines 2017 MacMahon H, Naidich DP, Goo JM, et al. Guidelines for management of incidental [name] nodules detected on CT Images: From the Fleischner Society. Radiology 2017; [id number]. Copyright  2017 Radiological Society of [place].  Evaluation of the incidental solid [name] nodule in adults Nodule size (mm) Low (<5%) cancer risk High (>65%) or moderate (5 to 65%) cancer risk  Solitary  <6 No routine follow-up Optional CT at 12 months  6 to 8 CT at 6 to 12 months, then consider CT at 18 to 24 months CT at 6 to 12 months, then CT at 18 to 24 months  >8 CT at 3 months, then at 9 and 24 months FDG PET/CT, biopsy or resection  Multiple (evaluation based on largest nodule)  <6 No routine follow-up Optional CT at 12 months  ?6 CT at 3 to 6 months, then consider CT at 18 to 24 months CT at 3 to 6 months, then CT at 18 to 24 months    Not applicable to patients age <35 years, in lung cancer screening, with immunosuppression, known [name] disease or symptoms or active primary  cancer.  Chest CT performed without contrast as contiguous 1 mm sections using low dose technique.  Growing or FDG-avid nodules should undergo biopsy or resection. Growth is defined as >1.5 mm increase.  Nodules unchanged for >2 years are benign.   CT: computed tomography; FDG: 18-fluorodeoxyglucose; PET: positron emission tomography.   Return to Care: Return in about 2 months (around 03/21/2020).  Lenice Llamas, MD [name] [place]  CC: [name] Hector, MD

## 2020-01-20 NOTE — Patient Instructions (Signed)
The patient should have follow up scheduled with [facility] in 2 months.   Prior to next visit patient should have: Full set of PFTs PET scan   Will contact him on referral to thoracic surgery vs bronchoscopy based on results of PET scan.

## 2020-01-22 ENCOUNTER — Ambulatory Visit (INDEPENDENT_AMBULATORY_CARE_PROVIDER_SITE_OTHER): Payer: BC Managed Care – PPO | Admitting: Internal Medicine

## 2020-01-22 ENCOUNTER — Other Ambulatory Visit: Payer: Self-pay

## 2020-01-22 DIAGNOSIS — J432 Centrilobular emphysema: Secondary | ICD-10-CM | POA: Diagnosis not present

## 2020-01-22 LAB — PULMONARY FUNCTION TEST
DL/VA % pred: 113 %
DL/VA: 4.76 ml/min/mmHg/L
DLCO cor % pred: 117 %
DLCO cor: 33.76 ml/min/mmHg
DLCO unc % pred: 123 %
DLCO unc: 35.36 ml/min/mmHg

## 2020-01-22 NOTE — Progress Notes (Signed)
PFT completed today.  

## 2020-02-02 ENCOUNTER — Other Ambulatory Visit: Payer: Self-pay

## 2020-02-02 ENCOUNTER — Encounter (HOSPITAL_COMMUNITY)
Admission: RE | Admit: 2020-02-02 | Discharge: 2020-02-02 | Disposition: A | Discharge status: Home / Self Care | Payer: BC Managed Care – PPO | Source: Ambulatory Visit | Attending: Internal Medicine | Admitting: Internal Medicine

## 2020-02-02 DIAGNOSIS — R911 Solitary pulmonary nodule: Secondary | ICD-10-CM | POA: Diagnosis not present

## 2020-02-02 LAB — GLUCOSE, CAPILLARY: Glucose-Capillary: 97 mg/dL (ref 70–99)

## 2020-02-02 MED ORDER — FLUDEOXYGLUCOSE F - 18 (FDG) INJECTION
15.2000 | Freq: Once | INTRAVENOUS | Status: AC | PRN
  Administered 2020-02-02: 15.2 via INTRAVENOUS

## 2020-02-08 NOTE — Telephone Encounter (Signed)
Sarah, please see pt's mychart message and advise.

## 2020-02-10 NOTE — Telephone Encounter (Signed)
Pt has been scheduled for Televisit with TP tomorrow 6/17. Nothing further needed.

## 2020-02-11 ENCOUNTER — Other Ambulatory Visit: Payer: Self-pay

## 2020-02-11 ENCOUNTER — Ambulatory Visit (INDEPENDENT_AMBULATORY_CARE_PROVIDER_SITE_OTHER): Payer: BC Managed Care – PPO | Admitting: Adult Health

## 2020-02-11 ENCOUNTER — Encounter: Payer: Self-pay | Admitting: Adult Health

## 2020-02-11 DIAGNOSIS — J432 Centrilobular emphysema: Secondary | ICD-10-CM

## 2020-02-11 DIAGNOSIS — R911 Solitary pulmonary nodule: Secondary | ICD-10-CM

## 2020-02-11 NOTE — Addendum Note (Signed)
Addended by: Desmond Dike C on: 02/11/2020 02:13 PM   Modules accepted: Orders

## 2020-02-11 NOTE — Patient Instructions (Addendum)
Referred to thoracic surgery. Continue to work on quitting smoking. Follow-up with Dr. Shearon Stalls in 3 months and As needed

## 2020-02-11 NOTE — Progress Notes (Signed)
Virtual Visit via Telephone Note  I connected with [name] on 02/11/20 at 10:00 AM EDT by telephone and verified that I am speaking with the correct person using two identifiers.  Location: Patient: Home  Provider:Home Office    I discussed the limitations, risks, security and privacy concerns of performing an evaluation and management service by telephone and the availability of in person appointments. I also discussed with the patient that there may be a patient responsible charge related to this service. The patient expressed understanding and agreed to proceed.   History of Present Illness: 64 year old male active smoker seen for pulmonary consult Jan 20, 2020 for enlarging lung nodule on low-dose CT chest.  Medical history significant for coronary artery disease, obstructive sleep apnea on CPAP  Today's televisit is a 1 month follow-up.  Patient was seen last visit for pulmonary consult for an enlarging lung nodule on low-dose CT chest. CT chest done Jan 14, 2020 showed emphysema, marked interval progression of cystic lesion in the posterior left lower lobe with nodular peripheral dominant nodular component.  Measuring 8.3 mm Patient was set up for a PET scan which was done February 02, 2020 that showed a 10 x 16 mm hypermetabolic thick-walled cavitary lesion in the posterior left lower lobe suspicious for a primary bronchogenic neoplasm such as squamous cell carcinoma.  No findings suspicious for metastatic disease.  There was no hypermetabolic adenopathy noted. Patient says he is doing well.  He has no increased cough or shortness of breath.  Patient says he is very active.  He works full-time.  Does all of his yard work independently.  Patient was set up for pulmonary function testing however was unable to complete the spirometry component.  TLC was 89%.  DLCO was 123%.  Patient is not on any oxygen.  Observations/Objective: Speaks in full sentences with no audible  distress.    Assessment and Plan: Hypermetabolic left lower lobe cavitary lesion measuring 10 x 16 mm.  No evidence of metastatic disease on PET scan.  -This is highly suspicious for a primary bronchogenic neoplasm.   Patient is fully independent and is not on oxygen.  Pulmonary function testing showed total lung capacity at 89% and a normal DLCO. -We will refer to thoracic surgery for consideration of resection.  Tobacco abuse-discussed smoking cessation  Plan  . Patient Instructions  Referred to thoracic surgery.       Follow Up Instructions: Follow-up in 3 months and as needed   I discussed the assessment and treatment plan with the patient. The patient was provided an opportunity to ask questions and all were answered. The patient agreed with the plan and demonstrated an understanding of the instructions.   The patient was advised to call back or seek an in-person evaluation if the symptoms worsen or if the condition fails to improve as anticipated.  I provided 22  minutes of non-face-to-face time during this encounter.   Rexene Edison, NP

## 2020-02-26 ENCOUNTER — Encounter: Payer: Self-pay | Admitting: Thoracic Surgery (Cardiothoracic Vascular Surgery)

## 2020-02-26 ENCOUNTER — Other Ambulatory Visit: Payer: Self-pay | Admitting: *Deleted

## 2020-02-26 ENCOUNTER — Institutional Professional Consult (permissible substitution): Payer: BC Managed Care – PPO | Admitting: Thoracic Surgery (Cardiothoracic Vascular Surgery)

## 2020-02-26 ENCOUNTER — Other Ambulatory Visit: Payer: Self-pay | Admitting: Thoracic Surgery (Cardiothoracic Vascular Surgery)

## 2020-02-26 ENCOUNTER — Other Ambulatory Visit: Payer: Self-pay

## 2020-02-26 ENCOUNTER — Encounter: Payer: Self-pay | Admitting: *Deleted

## 2020-02-26 VITALS — BP 129/79 | HR 56 | Temp 98.4°F | Resp 20 | Ht 73.0 in | Wt 304.0 lb

## 2020-02-26 DIAGNOSIS — R911 Solitary pulmonary nodule: Secondary | ICD-10-CM

## 2020-02-26 DIAGNOSIS — I10 Essential (primary) hypertension: Secondary | ICD-10-CM

## 2020-02-26 NOTE — Progress Notes (Signed)
[address]       [place] [address]             [phone]                    [name] Medical Record #[id number] Date of Birth: 04/28/1956  Referring: Melvenia Needles, NP Primary Care: [name], MD Primary Cardiologist: [name], MD  Chief Complaint:    Chief Complaint  Patient presents with  . Lung Lesion    Surgical consultation for LLL nodule    History of Present Illness:    [name] 64 y.o. male presents for surgical evaluation of a left lower lobe pulmonary nodule.  He has a long history of coronary artery disease and on surveillance he was noted to have this nodule.  PET/CT showed an avidity of 3.2 and this 1.6 cm cavitary lesion.  He states that he has been asymptomatic, in that he has not had any shortness of breath or coughs.  His weight has been stable and he denies any neurologic symptoms.  He continues to smoke about a pack a day, but states that he is willing to quit so that he can have surgery.      Zubrod Score: At the time of surgery this patient's most appropriate activity status/level should be described as: [x]     0    Normal activity, no symptoms []     1    Restricted in physical strenuous activity but ambulatory, able to do out light work []     2    Ambulatory and capable of self care, unable to do work activities, up and about               >50 % of waking hours                              []     3    Only limited self care, in bed greater than 50% of waking hours []     4    Completely disabled, no self care, confined to bed or chair []     5    Moribund   Past Medical History:  Diagnosis Date  . Arthritis of knee    seeing Dr. Alfonso Ramus  . Coronary artery disease   . Depression with anxiety    hx of  . Exogenous obesity   . History of sleep apnea   . Hyperlipidemia   . Hypertension   . Panic attacks    hx of  . Smoker unmotivated to quit   . Varicose veins     Past Surgical  History:  Procedure Laterality Date  . CARDIAC CATHETERIZATION  09/06/2008   stent to mid LAD and proximal LAD  . COLONOSCOPY N/A 05/01/2013   Procedure: COLONOSCOPY;  Surgeon: [name], MD;  Location: [facility];  Service: Endoscopy;  Laterality: N/A;  8:30 AM  . polyps of lung  2008   removed by [facility]  . TONSILLECTOMY      Family History  Problem Relation Age of Onset  . Heart disease Father        had cabg  . Esophageal cancer Father 32       esophageal  . Lung cancer Brother        lung cancer  . Colon cancer Neg Hx  Social History   Tobacco Use  Smoking Status Current Every Day Smoker  . Packs/day: 1.00  . Years: 40.00  . Pack years: 40.00  . Types: Cigarettes  . Start date: 1973  Smokeless Tobacco Never Used  Tobacco Comment   smokes 10 cigarettes  01/20/20 [facility]     Social History   Substance and Sexual Activity  Alcohol Use Yes  . Alcohol/week: 0.0 standard drinks   Comment: occasionally     No Known Allergies  Current Outpatient Medications  Medication Sig Dispense Refill  . clopidogrel (PLAVIX) 75 MG tablet TAKE 1 TABLET BY MOUTH DAILY 90 tablet 3  . Evolocumab (REPATHA SURECLICK) 485 MG/ML SOAJ Inject 140 mg into the skin every 14 (fourteen) days. 2 pen 11  . fluticasone (FLONASE) 50 MCG/ACT nasal spray Place 2 sprays into both nostrils daily. 16 g 6  . hydrochlorothiazide (HYDRODIURIL) 25 MG tablet Take 1 tablet (25 mg total) by mouth daily as needed (edema). 30 tablet 11  . HYDROcodone-acetaminophen (NORCO) 7.5-325 MG tablet Take 1 tablet by mouth every 6 (six) hours as needed for moderate pain. 30 tablet 0  . losartan (COZAAR) 100 MG tablet TAKE 1 TABLET BY MOUTH EVERY DAY 90 tablet 2  . metoprolol succinate (TOPROL-XL) 25 MG 24 hr tablet TAKE 1/2 TABLET BY MOUTH EVERY DAY 45 tablet 2  . nitroGLYCERIN (NITROSTAT) 0.4 MG SL tablet Place 1 tablet (0.4 mg total) under the tongue every 5 (five) minutes as needed for chest pain. 25  tablet 3  . PARoxetine (PAXIL) 40 MG tablet TAKE 1 TABLET(40 MG) BY MOUTH DAILY 90 tablet 3   No current facility-administered medications for this visit.    Review of Systems  Constitutional: Negative.   HENT: Negative.   Respiratory: Negative for cough and shortness of breath.   Cardiovascular: Negative for chest pain.  Musculoskeletal: Positive for joint pain.  Neurological: Negative.      PHYSICAL EXAMINATION: BP 129/79 (BP Location: Right Arm, Patient Position: Sitting, Cuff Size: Large)   Pulse (!) 56   Temp 98.4 F (36.9 C) (Temporal)   Resp 20   Ht 6\' 1"  (1.854 m)   Wt (!) 304 lb (137.9 kg)   SpO2 96% Comment: RA  BMI 40.11 kg/m  Physical Exam Constitutional:      Appearance: Normal appearance.  HENT:     Head: Normocephalic and atraumatic.  Eyes:     General: No scleral icterus.    Conjunctiva/sclera: Conjunctivae normal.  Cardiovascular:     Rate and Rhythm: Normal rate.  Pulmonary:     Effort: Pulmonary effort is normal. No respiratory distress.  Abdominal:     General: There is no distension.  Musculoskeletal:        General: Normal range of motion.     Cervical back: Normal range of motion.  Skin:    General: Skin is warm and dry.  Neurological:     General: No focal deficit present.     Mental Status: He is alert and oriented to person, place, and time.     Diagnostic Studies & Laboratory data:     Recent Radiology Findings:   NM PET Image Initial (PI) Skull Base To Thigh  Result Date: 02/02/2020 CLINICAL DATA:  Initial treatment strategy for left lower lobe pulmonary nodule. EXAM: NUCLEAR MEDICINE PET SKULL BASE TO THIGH TECHNIQUE: 15.2 mCi F-18 FDG was injected intravenously. Full-ring PET imaging was performed from the skull base to thigh after the radiotracer. CT data was obtained  and used for attenuation correction and anatomic localization. Fasting blood glucose: 97 mg/dl COMPARISON:  Low-dose lung cancer screening CT chest dated 01/13/2020  FINDINGS: Mediastinal blood pool activity: SUV max 3.6 Liver activity: SUV max NA NECK: No hypermetabolic cervical lymphadenopathy. Incidental CT findings: none CHEST: 10 x 16 mm thick-walled cavitary lesion in the posterior left lower lobe (series 8/image 40), max SUV 3.2, suspicious for primary bronchogenic neoplasm such as squamous cell carcinoma. Mild paraseptal emphysematous changes at the lung apices. No hypermetabolic thoracic lymphadenopathy. Incidental CT findings: Atherosclerotic calcifications of the aortic arch. 3 vessel coronary atherosclerosis. ABDOMEN/PELVIS: No abnormal hypermetabolism in the liver, spleen, pancreas, or adrenal glands. No hypermetabolic abdominopelvic lymphadenopathy. Incidental CT findings: Left renal cysts. Atherosclerotic calcifications the abdominal aorta and branch vessels. Sigmoid diverticulosis, without evidence of diverticulitis. SKELETON: No focal hypermetabolic activity to suggest skeletal metastasis. Incidental CT findings: Degenerative changes of the visualized thoracolumbar spine. IMPRESSION: 10 x 16 mm hypermetabolic thick-walled cavitary lesion in the posterior left lower lobe, suspicious for primary bronchogenic neoplasm such as squamous cell carcinoma. No findings suspicious for metastatic disease. Electronically Signed   By: Julian Hy M.D.   On: 02/02/2020 12:53       I have independently reviewed the above radiology studies  and reviewed the findings with the patient.   Recent Lab Findings: Lab Results  Component Value Date   WBC 8.3 12/08/2019   HGB 16.4 12/08/2019   HCT 48.3 12/08/2019   PLT 180 12/08/2019   GLUCOSE 96 12/08/2019   CHOL 135 12/08/2019   TRIG 80 12/08/2019   HDL 40 12/08/2019   LDLDIRECT 178 (H) 11/18/2017   LDLCALC 79 12/08/2019   ALT 16 12/08/2019   AST 13 12/08/2019   NA 140 12/08/2019   K 4.4 12/08/2019   CL 105 12/08/2019   CREATININE 0.80 12/08/2019   BUN 14 12/08/2019   CO2 27 12/08/2019   TSH 1.71  12/21/2015     PFTs:  -DLCO: 117%  Problem List: 1.6 cm cavitary left lower lobe pulmonary nodule concerning for primary lung cancer History of coronary artery disease currently on Plavix.  Status post PCI in 2010 Current smoker, but states that he is ready to quit Morbid obesity. Obstructive sleep apnea  Assessment / Plan:   64 year old male with a 1.6 cm PET avid left lower lobe pulmonary nodule concerning for primary lung cancer.  I explained the risks, and benefits of proceeding with a bronchoscopy left robotic assisted thoracoscopy, wedge resection of the left lower lobe lesion and possible lobectomy.  I further explained the importance of smoking cessation in the setting of a lung resection and that if he would be unable to stop smoking then radiation and chemotherapy would be the best course of action.  He stated that he would prefer to have surgery and is tentatively scheduled for July 14.  He understands the risks of a prolonged air leak given his significant smoking history.  I have ordered a stress test and will also touch base with Dr. Johnsie Cancel to ensure that he is fit for surgery from a cardiovascular standpoint.  He will also need to be off of his Plavix for 5 days prior to surgery.     I  spent 40 minutes with  the patient face to face and greater then 50% of the time was spent in counseling and coordination of care.    [name] 02/26/2020 12:14 PM

## 2020-03-01 ENCOUNTER — Telehealth (HOSPITAL_COMMUNITY): Payer: Self-pay | Admitting: *Deleted

## 2020-03-01 NOTE — Telephone Encounter (Signed)
Patient given detailed instructions per Myocardial Perfusion Study Information Sheet for the test on 03/03/20 at 1:15. Patient notified to arrive 15 minutes early and that it is imperative to arrive on time for appointment to keep from having the test rescheduled.  If you need to cancel or reschedule your appointment, please call the office within 24 hours of your appointment. . Patient verbalized understanding.Veronia Beets

## 2020-03-01 NOTE — Telephone Encounter (Signed)
Patient given detailed instructions per Myocardial Perfusion Study Information Sheet for the test on 03/03/20 at 1:15. Patient notified to arrive 15 minutes early and that it is imperative to arrive on time for appointment to keep from having the test rescheduled.  If you need to cancel or reschedule your appointment, please call the office within 24 hours of your appointment. . Patient verbalized understanding.[name]

## 2020-03-03 ENCOUNTER — Other Ambulatory Visit: Payer: Self-pay

## 2020-03-03 ENCOUNTER — Ambulatory Visit (HOSPITAL_COMMUNITY): Payer: BC Managed Care – PPO | Attending: Cardiovascular Disease

## 2020-03-03 VITALS — Ht 73.0 in | Wt 304.0 lb

## 2020-03-03 DIAGNOSIS — Z01818 Encounter for other preprocedural examination: Secondary | ICD-10-CM | POA: Insufficient documentation

## 2020-03-03 DIAGNOSIS — I10 Essential (primary) hypertension: Secondary | ICD-10-CM | POA: Insufficient documentation

## 2020-03-03 DIAGNOSIS — Z01810 Encounter for preprocedural cardiovascular examination: Secondary | ICD-10-CM | POA: Diagnosis not present

## 2020-03-03 MED ORDER — REGADENOSON 0.4 MG/5ML IV SOLN
0.4000 mg | Freq: Once | INTRAVENOUS | Status: AC
  Administered 2020-03-03: 0.4 mg via INTRAVENOUS

## 2020-03-03 MED ORDER — TECHNETIUM TC 99M TETROFOSMIN IV KIT
31.1000 | PACK | Freq: Once | INTRAVENOUS | Status: AC | PRN
  Administered 2020-03-03: 31.1 via INTRAVENOUS
  Filled 2020-03-03: qty 32

## 2020-03-04 ENCOUNTER — Ambulatory Visit (HOSPITAL_COMMUNITY): Payer: BC Managed Care – PPO | Attending: Internal Medicine

## 2020-03-04 LAB — MYOCARDIAL PERFUSION IMAGING
LV dias vol: 132 mL (ref 62–150)
LV sys vol: 69 mL
Peak HR: 67 {beats}/min
Rest HR: 52 {beats}/min
SDS: 3
SRS: 0
SSS: 3
TID: 1.05

## 2020-03-04 MED ORDER — TECHNETIUM TC 99M TETROFOSMIN IV KIT
30.8000 | PACK | Freq: Once | INTRAVENOUS | Status: AC | PRN
  Administered 2020-03-04: 30.8 via INTRAVENOUS
  Filled 2020-03-04: qty 31

## 2020-03-07 ENCOUNTER — Encounter (HOSPITAL_COMMUNITY): Payer: Self-pay

## 2020-03-07 ENCOUNTER — Other Ambulatory Visit: Payer: Self-pay

## 2020-03-07 ENCOUNTER — Encounter (HOSPITAL_COMMUNITY)
Admission: RE | Admit: 2020-03-07 | Discharge: 2020-03-07 | Disposition: A | Discharge status: Home / Self Care | Payer: BC Managed Care – PPO | Source: Ambulatory Visit | Attending: Thoracic Surgery (Cardiothoracic Vascular Surgery) | Admitting: Thoracic Surgery (Cardiothoracic Vascular Surgery)

## 2020-03-07 ENCOUNTER — Other Ambulatory Visit (HOSPITAL_COMMUNITY)
Admission: RE | Admit: 2020-03-07 | Discharge: 2020-03-07 | Disposition: A | Discharge status: Home / Self Care | Payer: BC Managed Care – PPO | Source: Ambulatory Visit | Attending: Thoracic Surgery (Cardiothoracic Vascular Surgery) | Admitting: Thoracic Surgery (Cardiothoracic Vascular Surgery)

## 2020-03-07 ENCOUNTER — Ambulatory Visit (HOSPITAL_COMMUNITY)
Admission: RE | Admit: 2020-03-07 | Discharge: 2020-03-07 | Disposition: A | Discharge status: Home / Self Care | Payer: BC Managed Care – PPO | Source: Ambulatory Visit | Attending: Thoracic Surgery (Cardiothoracic Vascular Surgery) | Admitting: Thoracic Surgery (Cardiothoracic Vascular Surgery)

## 2020-03-07 DIAGNOSIS — Z79899 Other long term (current) drug therapy: Secondary | ICD-10-CM | POA: Diagnosis not present

## 2020-03-07 DIAGNOSIS — I10 Essential (primary) hypertension: Secondary | ICD-10-CM | POA: Diagnosis not present

## 2020-03-07 DIAGNOSIS — Z01818 Encounter for other preprocedural examination: Secondary | ICD-10-CM | POA: Insufficient documentation

## 2020-03-07 DIAGNOSIS — J9811 Atelectasis: Secondary | ICD-10-CM | POA: Diagnosis not present

## 2020-03-07 DIAGNOSIS — G4733 Obstructive sleep apnea (adult) (pediatric): Secondary | ICD-10-CM | POA: Diagnosis not present

## 2020-03-07 DIAGNOSIS — I2510 Atherosclerotic heart disease of native coronary artery without angina pectoris: Secondary | ICD-10-CM | POA: Diagnosis not present

## 2020-03-07 DIAGNOSIS — C3432 Malignant neoplasm of lower lobe, left bronchus or lung: Secondary | ICD-10-CM | POA: Diagnosis not present

## 2020-03-07 DIAGNOSIS — E559 Vitamin D deficiency, unspecified: Secondary | ICD-10-CM | POA: Diagnosis not present

## 2020-03-07 DIAGNOSIS — Z8249 Family history of ischemic heart disease and other diseases of the circulatory system: Secondary | ICD-10-CM | POA: Diagnosis not present

## 2020-03-07 DIAGNOSIS — C3492 Malignant neoplasm of unspecified part of left bronchus or lung: Secondary | ICD-10-CM | POA: Diagnosis not present

## 2020-03-07 DIAGNOSIS — Z20822 Contact with and (suspected) exposure to covid-19: Secondary | ICD-10-CM | POA: Diagnosis not present

## 2020-03-07 DIAGNOSIS — K5730 Diverticulosis of large intestine without perforation or abscess without bleeding: Secondary | ICD-10-CM | POA: Diagnosis not present

## 2020-03-07 DIAGNOSIS — R911 Solitary pulmonary nodule: Secondary | ICD-10-CM

## 2020-03-07 DIAGNOSIS — F17210 Nicotine dependence, cigarettes, uncomplicated: Secondary | ICD-10-CM | POA: Diagnosis not present

## 2020-03-07 DIAGNOSIS — J90 Pleural effusion, not elsewhere classified: Secondary | ICD-10-CM | POA: Diagnosis not present

## 2020-03-07 DIAGNOSIS — Z6841 Body Mass Index (BMI) 40.0 and over, adult: Secondary | ICD-10-CM | POA: Diagnosis not present

## 2020-03-07 DIAGNOSIS — E7800 Pure hypercholesterolemia, unspecified: Secondary | ICD-10-CM | POA: Diagnosis not present

## 2020-03-07 DIAGNOSIS — N281 Cyst of kidney, acquired: Secondary | ICD-10-CM | POA: Diagnosis not present

## 2020-03-07 DIAGNOSIS — Z7902 Long term (current) use of antithrombotics/antiplatelets: Secondary | ICD-10-CM | POA: Diagnosis not present

## 2020-03-07 DIAGNOSIS — Z9889 Other specified postprocedural states: Secondary | ICD-10-CM | POA: Diagnosis not present

## 2020-03-07 DIAGNOSIS — F418 Other specified anxiety disorders: Secondary | ICD-10-CM | POA: Diagnosis not present

## 2020-03-07 DIAGNOSIS — E785 Hyperlipidemia, unspecified: Secondary | ICD-10-CM | POA: Diagnosis not present

## 2020-03-07 HISTORY — DX: Sleep apnea, unspecified: G47.30

## 2020-03-07 HISTORY — DX: Malignant (primary) neoplasm, unspecified: C80.1

## 2020-03-07 LAB — COMPREHENSIVE METABOLIC PANEL WITH GFR
ALT: 24 U/L (ref 0–44)
AST: 19 U/L (ref 15–41)
Albumin: 4.1 g/dL (ref 3.5–5.0)
Alkaline Phosphatase: 67 U/L (ref 38–126)
Anion gap: 10 (ref 5–15)
BUN: 11 mg/dL (ref 8–23)
CO2: 27 mmol/L (ref 22–32)
Calcium: 9.1 mg/dL (ref 8.9–10.3)
Chloride: 102 mmol/L (ref 98–111)
Creatinine, Ser: 0.88 mg/dL (ref 0.61–1.24)
GFR calc Af Amer: >60 mL/min
GFR calc non Af Amer: >60 mL/min
Glucose, Bld: 97 mg/dL (ref 70–99)
Potassium: 3.7 mmol/L (ref 3.5–5.1)
Sodium: 139 mmol/L (ref 135–145)
Total Bilirubin: 0.6 mg/dL (ref 0.3–1.2)
Total Protein: 7 g/dL (ref 6.5–8.1)

## 2020-03-07 LAB — SARS CORONAVIRUS 2 (TAT 6-24 HRS): SARS Coronavirus 2: NEGATIVE

## 2020-03-07 LAB — PROTIME-INR
INR: 1 (ref 0.8–1.2)
Prothrombin Time: 13 seconds (ref 11.4–15.2)

## 2020-03-07 LAB — URINALYSIS, ROUTINE W REFLEX MICROSCOPIC
Bilirubin Urine: NEGATIVE
Glucose, UA: NEGATIVE mg/dL
Ketones, ur: NEGATIVE mg/dL
Leukocytes,Ua: NEGATIVE
Nitrite: NEGATIVE
Protein, ur: NEGATIVE mg/dL
Specific Gravity, Urine: 1.025 (ref 1.005–1.030)
pH: 6 (ref 5.0–8.0)

## 2020-03-07 LAB — URINALYSIS, MICROSCOPIC (REFLEX)

## 2020-03-07 LAB — TYPE AND SCREEN
ABO/RH(D): A POS
Antibody Screen: NEGATIVE

## 2020-03-07 LAB — CBC
HCT: 46.8 % (ref 39.0–52.0)
Hemoglobin: 15.6 g/dL (ref 13.0–17.0)
MCH: 32.6 pg (ref 26.0–34.0)
MCHC: 33.3 g/dL (ref 30.0–36.0)
MCV: 97.7 fL (ref 80.0–100.0)
Platelets: 147 10*3/uL — ABNORMAL LOW (ref 150–400)
RBC: 4.79 MIL/uL (ref 4.22–5.81)
RDW: 12.2 % (ref 11.5–15.5)
WBC: 7.2 10*3/uL (ref 4.0–10.5)
nRBC: 0 % (ref 0.0–0.2)

## 2020-03-07 LAB — SURGICAL PCR SCREEN
MRSA, PCR: NEGATIVE
Staphylococcus aureus: NEGATIVE

## 2020-03-07 LAB — APTT: aPTT: 27 seconds (ref 24–36)

## 2020-03-07 NOTE — Progress Notes (Addendum)
PCP - Dr. Dennard Schaumann  Cardiologist - Dr. Johnsie Cancel  Chest x-ray - 03/07/20  EKG - 03/07/20  Stress Test - 03/04/20 (E)  ECHO - Denies  Cardiac Cath - 08/2008 stens x3  AICD-na PM-na LOOP-na  Sleep Study - Yes- Positive CPAP - Yes- will bring dos  LABS- 03/07/20: CBC, CMP, PT, PTT, T/S, PCR, UA, COVID 03/09/20: ABG (no lab tech at PAT appt), T/S- 2nd sample  ASA- Denies Plavix- LD- 6/29  ERAS- No  HA1C- Denies   Anesthesia- Yes- cardiac history  Pt denies having chest pain, sob, or fever at this time. All instructions explained to the pt, with a verbal understanding of the material. Pt agrees to go over the instructions while at home for a better understanding. Pt also instructed to self quarantine after being tested for COVID-19. The opportunity to ask questions was provided.   Coronavirus Screening  Have you experienced the following symptoms:  Cough yes/no: No Fever (>100.37F)  yes/no: No Runny nose yes/no: No Sore throat yes/no: No Difficulty breathing/shortness of breath  yes/no: No  Have you or a family member traveled in the last 14 days and where? yes/no: No   If the patient indicates "YES" to the above questions, their PAT will be rescheduled to limit the exposure to others and, the surgeon will be notified. THE PATIENT WILL NEED TO BE ASYMPTOMATIC FOR 14 DAYS.   If the patient is not experiencing any of these symptoms, the PAT nurse will instruct them to NOT bring anyone with them to their appointment since they may have these symptoms or traveled as well.   Please remind your patients and families that hospital visitation restrictions are in effect and the importance of the restrictions.

## 2020-03-07 NOTE — Pre-Procedure Instructions (Signed)
[name]  03/07/2020      [facility] [phone] - [place], [place] [facility] [place] [address] [place] [address] Phone: [phone] Fax: [place], [place] [address] [place] [address] Phone: [phone] Fax: [phone]    Your procedure is scheduled on July 14  Report to [facility] Entrance A at 6:30 A.M.  Call this number if you have problems the morning of surgery:  [phone]   Remember:  Do not eat or drink after midnight.      Take these medicines the morning of surgery with A SIP OF WATER :              Metoprolol (toprol-xl)                         If needed:  Eye drops, flonase, nitroglycerine            7 days prior to surgery STOP taking any Aspirin/ plaxix (unless otherwise instructed by your surgeon), Aleve, Naproxen, Ibuprofen, Motrin, Advil, Goody's, BC's, all herbal medications, fish oil, and all vitamins.                        Follow your surgeon's instructions on when to stop Plavix (clopidogrel).  If no instructions were given by your surgeon then you will need to call the office to get those instructions.       Do not wear jewelry.  Do not wear lotions, powders, or perfumes, or deodorant.  Do not shave 48 hours prior to surgery.  Men may shave face and neck.  Do not bring valuables to the hospital.  [facility] is not responsible for any belongings or valuables.  Contacts, dentures or bridgework may not be worn into surgery.  Leave your suitcase in the car.  After surgery it may be brought to your room.  For patients admitted to the hospital, discharge time will be determined by your treatment team.  Patients discharged the day of surgery will not be allowed to drive home.    Special instructions:  [place]- Preparing For Surgery  Before surgery, you can play an important role.  Because skin is not sterile, your skin needs to be as free of germs as possible. You can reduce the number of germs on your skin by washing with CHG (chlorahexidine gluconate) Soap before surgery.  CHG is an antiseptic cleaner which kills germs and bonds with the skin to continue killing germs even after washing.    Oral Hygiene is also important to reduce your risk of infection.  Remember - BRUSH YOUR TEETH THE MORNING OF SURGERY WITH YOUR REGULAR TOOTHPASTE  Please do not use if you have an allergy to CHG or antibacterial soaps. If your skin becomes reddened/irritated stop using the CHG.  Do not shave (including legs and underarms) for at least 48 hours prior to first CHG shower. It is OK to shave your face.  Please follow these instructions carefully.   1. Shower the NIGHT BEFORE SURGERY and the MORNING OF SURGERY with CHG.   2. If you chose to wash your hair, wash your hair first as usual with your normal shampoo.  3. After you shampoo, rinse your hair and body thoroughly to remove the shampoo.  4. Use CHG  as you would any other liquid soap. You can apply CHG directly to the skin and wash gently with a scrungie or a clean washcloth.   5. Apply the CHG Soap to your body ONLY FROM THE NECK DOWN.  Do not use on open wounds or open sores. Avoid contact with your eyes, ears, mouth and genitals (private parts). Wash Face and genitals (private parts)  with your normal soap.  6. Wash thoroughly, paying special attention to the area where your surgery will be performed.  7. Thoroughly rinse your body with warm water from the neck down.  8. DO NOT shower/wash with your normal soap after using and rinsing off the CHG Soap.  9. Pat yourself dry with a CLEAN TOWEL.  10. Wear CLEAN PAJAMAS to bed the night before surgery, wear comfortable clothes the morning of surgery  11. Place CLEAN SHEETS on your bed the night of your first shower and DO NOT SLEEP WITH PETS.    Day of Surgery:  Do not  apply any deodorants/lotions.  Please wear clean clothes to the hospital/surgery center.   Remember to brush your teeth WITH YOUR REGULAR TOOTHPASTE.    Please read over the following fact sheets that you were given. Coughing and Deep Breathing and Surgical Site Infection Prevention

## 2020-03-08 MED ORDER — DEXTROSE 5 % IV SOLN
3.0000 g | INTRAVENOUS | Status: AC
  Administered 2020-03-09: 3 g via INTRAVENOUS
  Filled 2020-03-08: qty 3

## 2020-03-08 NOTE — Anesthesia Preprocedure Evaluation (Addendum)
Anesthesia Evaluation  Patient identified by MRN, date of birth, ID band Patient awake    Reviewed: Allergy & Precautions, NPO status , Patient's Chart, lab work & pertinent test results, reviewed documented beta blocker date and time   History of Anesthesia Complications Negative for: history of anesthetic complications  Airway Mallampati: II  TM Distance: >3 FB Neck ROM: Full    Dental  (+) Dental Advisory Given, Teeth Intact   Pulmonary sleep apnea and Continuous Positive Airway Pressure Ventilation , Current Smoker and Patient abstained from smoking.,    Pulmonary exam normal        Cardiovascular hypertension, Pt. on medications and Pt. on home beta blockers (-) angina+ CAD and + Cardiac Stents  Normal cardiovascular exam   '21 TTE - Nuclear stress EF: 48%. The left ventricular ejection fraction is mildly decreased (45-54%). There was no ST segment deviation noted during stress. Defect 1: There is a medium defect of mild severity present in the basal inferior, mid inferior and apical inferior location. Defect 2: There is a small defect of mild severity present in the apex location. Findings consistent with prior myocardial infarction. This is an intermediate risk study.    Neuro/Psych PSYCHIATRIC DISORDERS Anxiety Depression negative neurological ROS     GI/Hepatic negative GI ROS, Neg liver ROS,   Endo/Other  Morbid obesity  Renal/GU negative Renal ROS     Musculoskeletal  (+) Arthritis ,   Abdominal   Peds  Hematology negative hematology ROS (+)   Anesthesia Other Findings Covid test negative   Reproductive/Obstetrics                           Anesthesia Physical Anesthesia Plan  ASA: III  Anesthesia Plan: General   Post-op Pain Management:    Induction: Intravenous  PONV Risk Score and Plan: 3 and Treatment may vary due to age or medical condition, Ondansetron,  Dexamethasone and Midazolam  Airway Management Planned: Double Lumen EBT  Additional Equipment: Arterial line  Intra-op Plan:   Post-operative Plan: Possible Post-op intubation/ventilation  Informed Consent: I have reviewed the patients History and Physical, chart, labs and discussed the procedure including the risks, benefits and alternatives for the proposed anesthesia with the patient or authorized representative who has indicated his/her understanding and acceptance.     Dental advisory given  Plan Discussed with: CRNA and Anesthesiologist  Anesthesia Plan Comments: (2 large bore IVs vs. CVL )      Anesthesia Quick Evaluation

## 2020-03-08 NOTE — Progress Notes (Signed)
Anesthesia Chart Review:  Follows with cardiology for history of CAD status post DES to proximal and mid LAD in 2010.  Last seen by Dr.Nishan 12/15/2019, stable from CAD standpoint at that time, recommended 1 year follow-up.  He is a current smoker with a long smoking history and has recently discovered hypermetabolic left lower lung nodule.  Nuclear stress test 03/04/2020 showed fixed perfusion defects at the apex and inferior wall consistent with prior infarct.  He was deemed an intermediate risk study due to mild systolic dysfunction with EF 48%.  No ischemia. Dr. [name] instructed the patient to stop Plavix 5 days prior to surgery.  Pre pulmonology note 02/11/20, he had recent PFTs however was unable to complete the spirometry component.  TLC was 89%.  DLCO was 123%.  OSA on CPAP.  Preop labs reviewed, unremarkable.   EKG 12/23/19: Sinus bradycardia. Rate 59.  Nuclear stress 03/04/2020:  Nuclear stress EF: 48%.  The left ventricular ejection fraction is mildly decreased (45-54%).  There was no ST segment deviation noted during stress.  Defect 1: There is a medium defect of mild severity present in the basal inferior, mid inferior and apical inferior location.  Defect 2: There is a small defect of mild severity present in the apex location.  Findings consistent with prior myocardial infarction.  This is an intermediate risk study.   1. Fixed perfusion defects at apex and inferior wall consistent with prior infarct. 2. Intermediate risk study due to mild systolic dysfunction (EF 06%).  No ischemia.  Wynonia Musty [facility] Short Stay Center/Anesthesiology Phone [phone] 03/08/2020 9:18 AM

## 2020-03-09 ENCOUNTER — Inpatient Hospital Stay (HOSPITAL_COMMUNITY)
Admission: RE | Admit: 2020-03-09 | Discharge: 2020-03-10 | DRG: 164 | Disposition: A | Discharge status: Home / Self Care | Payer: BC Managed Care – PPO | Attending: Thoracic Surgery (Cardiothoracic Vascular Surgery) | Admitting: Thoracic Surgery (Cardiothoracic Vascular Surgery)

## 2020-03-09 ENCOUNTER — Encounter (HOSPITAL_COMMUNITY): Payer: Self-pay | Admitting: Thoracic Surgery (Cardiothoracic Vascular Surgery)

## 2020-03-09 ENCOUNTER — Inpatient Hospital Stay (HOSPITAL_COMMUNITY): Payer: BC Managed Care – PPO

## 2020-03-09 ENCOUNTER — Other Ambulatory Visit: Payer: Self-pay

## 2020-03-09 ENCOUNTER — Encounter (HOSPITAL_COMMUNITY)
Admission: RE | Disposition: A | Discharge status: Home / Self Care | Payer: Self-pay | Source: Home / Self Care | Attending: Thoracic Surgery (Cardiothoracic Vascular Surgery)

## 2020-03-09 ENCOUNTER — Inpatient Hospital Stay (HOSPITAL_COMMUNITY): Payer: BC Managed Care – PPO | Admitting: Physician Assistant

## 2020-03-09 DIAGNOSIS — G4733 Obstructive sleep apnea (adult) (pediatric): Secondary | ICD-10-CM | POA: Diagnosis present

## 2020-03-09 DIAGNOSIS — Z7902 Long term (current) use of antithrombotics/antiplatelets: Secondary | ICD-10-CM

## 2020-03-09 DIAGNOSIS — Z79899 Other long term (current) drug therapy: Secondary | ICD-10-CM | POA: Diagnosis not present

## 2020-03-09 DIAGNOSIS — Z20822 Contact with and (suspected) exposure to covid-19: Secondary | ICD-10-CM | POA: Diagnosis present

## 2020-03-09 DIAGNOSIS — E785 Hyperlipidemia, unspecified: Secondary | ICD-10-CM | POA: Diagnosis present

## 2020-03-09 DIAGNOSIS — Z6841 Body Mass Index (BMI) 40.0 and over, adult: Secondary | ICD-10-CM

## 2020-03-09 DIAGNOSIS — R911 Solitary pulmonary nodule: Secondary | ICD-10-CM

## 2020-03-09 DIAGNOSIS — N281 Cyst of kidney, acquired: Secondary | ICD-10-CM | POA: Diagnosis present

## 2020-03-09 DIAGNOSIS — I2510 Atherosclerotic heart disease of native coronary artery without angina pectoris: Secondary | ICD-10-CM | POA: Diagnosis present

## 2020-03-09 DIAGNOSIS — Z09 Encounter for follow-up examination after completed treatment for conditions other than malignant neoplasm: Secondary | ICD-10-CM

## 2020-03-09 DIAGNOSIS — F17210 Nicotine dependence, cigarettes, uncomplicated: Secondary | ICD-10-CM | POA: Diagnosis present

## 2020-03-09 DIAGNOSIS — C3432 Malignant neoplasm of lower lobe, left bronchus or lung: Principal | ICD-10-CM | POA: Diagnosis present

## 2020-03-09 DIAGNOSIS — Z8249 Family history of ischemic heart disease and other diseases of the circulatory system: Secondary | ICD-10-CM

## 2020-03-09 DIAGNOSIS — K5730 Diverticulosis of large intestine without perforation or abscess without bleeding: Secondary | ICD-10-CM | POA: Diagnosis present

## 2020-03-09 DIAGNOSIS — Z9889 Other specified postprocedural states: Secondary | ICD-10-CM

## 2020-03-09 DIAGNOSIS — I10 Essential (primary) hypertension: Secondary | ICD-10-CM | POA: Diagnosis present

## 2020-03-09 DIAGNOSIS — F418 Other specified anxiety disorders: Secondary | ICD-10-CM | POA: Diagnosis present

## 2020-03-09 HISTORY — PX: LYMPH NODE DISSECTION: SHX5087

## 2020-03-09 HISTORY — PX: LOBECTOMY: SHX5089

## 2020-03-09 HISTORY — PX: INTERCOSTAL NERVE BLOCK: SHX5021

## 2020-03-09 HISTORY — PX: VIDEO BRONCHOSCOPY: SHX5072

## 2020-03-09 LAB — BLOOD GAS, ARTERIAL
Acid-Base Excess: 1.2 mmol/L (ref 0.0–2.0)
Bicarbonate: 25.6 mmol/L (ref 20.0–28.0)
FIO2: 28
O2 Saturation: 97.1 %
Patient temperature: 37
pCO2 arterial: 42.7 mmHg (ref 32.0–48.0)
pH, Arterial: 7.395 (ref 7.350–7.450)
pO2, Arterial: 87.2 mmHg (ref 83.0–108.0)

## 2020-03-09 LAB — GLUCOSE, CAPILLARY
Glucose-Capillary: 132 mg/dL — ABNORMAL HIGH (ref 70–99)
Glucose-Capillary: 215 mg/dL — ABNORMAL HIGH (ref 70–99)

## 2020-03-09 SURGERY — WEDGE RESECTION, LUNG, ROBOT-ASSISTED, THORACOSCOPIC
Anesthesia: General | Site: Chest

## 2020-03-09 MED ORDER — SODIUM CHLORIDE FLUSH 0.9 % IV SOLN
INTRAVENOUS | Status: DC | PRN
  Administered 2020-03-09: 100 mL

## 2020-03-09 MED ORDER — FENTANYL CITRATE (PF) 250 MCG/5ML IJ SOLN
INTRAMUSCULAR | Status: AC
  Filled 2020-03-09: qty 5

## 2020-03-09 MED ORDER — LACTATED RINGERS IV SOLN: INTRAVENOUS | Status: DC

## 2020-03-09 MED ORDER — HEMOSTATIC AGENTS (NO CHARGE) OPTIME
TOPICAL | Status: DC | PRN
  Administered 2020-03-09 (×2): 1 via TOPICAL

## 2020-03-09 MED ORDER — SENNOSIDES-DOCUSATE SODIUM 8.6-50 MG PO TABS
1.0000 | ORAL_TABLET | Freq: Every day | ORAL | Status: DC
  Administered 2020-03-09: 1 via ORAL
  Filled 2020-03-09: qty 1

## 2020-03-09 MED ORDER — CHLORHEXIDINE GLUCONATE 0.12 % MT SOLN
15.0000 mL | Freq: Once | OROMUCOSAL | Status: DC
  Filled 2020-03-09: qty 15

## 2020-03-09 MED ORDER — PROMETHAZINE HCL 25 MG/ML IJ SOLN: 6.2500 mg | INTRAMUSCULAR | Status: DC | PRN

## 2020-03-09 MED ORDER — PROPOFOL 10 MG/ML IV BOLUS
INTRAVENOUS | Status: AC
  Filled 2020-03-09: qty 40

## 2020-03-09 MED ORDER — FENTANYL CITRATE (PF) 100 MCG/2ML IJ SOLN
INTRAMUSCULAR | Status: DC | PRN
  Administered 2020-03-09 (×2): 50 ug via INTRAVENOUS
  Administered 2020-03-09: 200 ug via INTRAVENOUS

## 2020-03-09 MED ORDER — ORAL CARE MOUTH RINSE: 15.0000 mL | Freq: Once | OROMUCOSAL | Status: DC

## 2020-03-09 MED ORDER — ACETAMINOPHEN 500 MG PO TABS
1000.0000 mg | ORAL_TABLET | Freq: Four times a day (QID) | ORAL | Status: DC
  Filled 2020-03-09: qty 2

## 2020-03-09 MED ORDER — BUPIVACAINE HCL (PF) 0.5 % IJ SOLN
INTRAMUSCULAR | Status: AC
  Filled 2020-03-09: qty 30

## 2020-03-09 MED ORDER — BUPIVACAINE LIPOSOME 1.3 % IJ SUSP
20.0000 mL | Freq: Once | INTRAMUSCULAR | Status: DC
  Filled 2020-03-09: qty 20

## 2020-03-09 MED ORDER — ACETAMINOPHEN 160 MG/5ML PO SOLN: 1000.0000 mg | Freq: Four times a day (QID) | ORAL | Status: DC

## 2020-03-09 MED ORDER — ENOXAPARIN SODIUM 40 MG/0.4ML ~~LOC~~ SOLN: 40.0000 mg | Freq: Every day | SUBCUTANEOUS | Status: DC

## 2020-03-09 MED ORDER — MIDAZOLAM HCL 2 MG/2ML IJ SOLN
INTRAMUSCULAR | Status: AC
  Filled 2020-03-09: qty 2

## 2020-03-09 MED ORDER — ROCURONIUM BROMIDE 10 MG/ML (PF) SYRINGE
PREFILLED_SYRINGE | INTRAVENOUS | Status: DC | PRN
  Administered 2020-03-09: 70 mg via INTRAVENOUS
  Administered 2020-03-09: 20 mg via INTRAVENOUS
  Administered 2020-03-09: 30 mg via INTRAVENOUS
  Administered 2020-03-09: 20 mg via INTRAVENOUS

## 2020-03-09 MED ORDER — ONDANSETRON HCL 4 MG/2ML IJ SOLN
INTRAMUSCULAR | Status: DC | PRN
  Administered 2020-03-09 (×2): 4 mg via INTRAVENOUS

## 2020-03-09 MED ORDER — SUCCINYLCHOLINE CHLORIDE 20 MG/ML IJ SOLN
INTRAMUSCULAR | Status: DC | PRN
  Administered 2020-03-09: 140 mg via INTRAVENOUS

## 2020-03-09 MED ORDER — EPHEDRINE SULFATE-NACL 50-0.9 MG/10ML-% IV SOSY
PREFILLED_SYRINGE | INTRAVENOUS | Status: DC | PRN
  Administered 2020-03-09: 5 mg via INTRAVENOUS
  Administered 2020-03-09: 10 mg via INTRAVENOUS
  Administered 2020-03-09 (×2): 5 mg via INTRAVENOUS
  Administered 2020-03-09 (×2): 10 mg via INTRAVENOUS
  Administered 2020-03-09: 5 mg via INTRAVENOUS

## 2020-03-09 MED ORDER — SODIUM CHLORIDE 0.9 % IR SOLN
Status: DC | PRN
  Administered 2020-03-09: 1000 mL

## 2020-03-09 MED ORDER — KETOROLAC TROMETHAMINE 15 MG/ML IJ SOLN
30.0000 mg | Freq: Four times a day (QID) | INTRAMUSCULAR | Status: DC | PRN
  Administered 2020-03-09 – 2020-03-10 (×3): 30 mg via INTRAVENOUS
  Filled 2020-03-09 (×4): qty 2

## 2020-03-09 MED ORDER — ONDANSETRON HCL 4 MG/2ML IJ SOLN: 4.0000 mg | Freq: Four times a day (QID) | INTRAMUSCULAR | Status: DC | PRN

## 2020-03-09 MED ORDER — INSULIN ASPART 100 UNIT/ML ~~LOC~~ SOLN
0.0000 [IU] | Freq: Three times a day (TID) | SUBCUTANEOUS | Status: DC
  Administered 2020-03-09: 8 [IU] via SUBCUTANEOUS
  Administered 2020-03-09 – 2020-03-10 (×3): 2 [IU] via SUBCUTANEOUS

## 2020-03-09 MED ORDER — CEFAZOLIN SODIUM-DEXTROSE 2-4 GM/100ML-% IV SOLN
2.0000 g | Freq: Three times a day (TID) | INTRAVENOUS | Status: AC
  Administered 2020-03-09 – 2020-03-10 (×2): 2 g via INTRAVENOUS
  Filled 2020-03-09 (×2): qty 100

## 2020-03-09 MED ORDER — LACTATED RINGERS IV SOLN: INTRAVENOUS | Status: DC | PRN

## 2020-03-09 MED ORDER — DEXAMETHASONE SODIUM PHOSPHATE 10 MG/ML IJ SOLN
INTRAMUSCULAR | Status: AC
  Filled 2020-03-09: qty 1

## 2020-03-09 MED ORDER — ONDANSETRON HCL 4 MG/2ML IJ SOLN
INTRAMUSCULAR | Status: AC
  Filled 2020-03-09: qty 2

## 2020-03-09 MED ORDER — PAROXETINE HCL 20 MG PO TABS
40.0000 mg | ORAL_TABLET | Freq: Every day | ORAL | Status: DC
  Administered 2020-03-09: 40 mg via ORAL
  Filled 2020-03-09: qty 2

## 2020-03-09 MED ORDER — DEXAMETHASONE SODIUM PHOSPHATE 10 MG/ML IJ SOLN
INTRAMUSCULAR | Status: DC | PRN
  Administered 2020-03-09: 10 mg via INTRAVENOUS

## 2020-03-09 MED ORDER — SUGAMMADEX SODIUM 200 MG/2ML IV SOLN
INTRAVENOUS | Status: DC | PRN
  Administered 2020-03-09: 100 mg via INTRAVENOUS
  Administered 2020-03-09: 200 mg via INTRAVENOUS

## 2020-03-09 MED ORDER — SODIUM CHLORIDE 0.9 % IV SOLN: INTRAVENOUS | Status: DC | PRN

## 2020-03-09 MED ORDER — KETOROLAC TROMETHAMINE 30 MG/ML IJ SOLN
INTRAMUSCULAR | Status: AC
  Administered 2020-03-09: 30 mg
  Filled 2020-03-09: qty 1

## 2020-03-09 MED ORDER — BISACODYL 5 MG PO TBEC
10.0000 mg | DELAYED_RELEASE_TABLET | Freq: Every day | ORAL | Status: DC
  Administered 2020-03-09: 10 mg via ORAL
  Filled 2020-03-09: qty 2

## 2020-03-09 MED ORDER — ROCURONIUM BROMIDE 10 MG/ML (PF) SYRINGE
PREFILLED_SYRINGE | INTRAVENOUS | Status: AC
  Filled 2020-03-09: qty 10

## 2020-03-09 MED ORDER — OXYCODONE HCL 5 MG/5ML PO SOLN: 5.0000 mg | Freq: Once | ORAL | Status: DC | PRN

## 2020-03-09 MED ORDER — TRAMADOL HCL 50 MG PO TABS
50.0000 mg | ORAL_TABLET | Freq: Four times a day (QID) | ORAL | Status: DC | PRN
  Administered 2020-03-09 – 2020-03-10 (×2): 100 mg via ORAL
  Filled 2020-03-09 (×2): qty 2

## 2020-03-09 MED ORDER — FENTANYL CITRATE (PF) 100 MCG/2ML IJ SOLN
INTRAMUSCULAR | Status: AC
  Administered 2020-03-09: 25 ug via INTRAVENOUS
  Filled 2020-03-09: qty 2

## 2020-03-09 MED ORDER — LIDOCAINE 2% (20 MG/ML) 5 ML SYRINGE
INTRAMUSCULAR | Status: DC | PRN
  Administered 2020-03-09: 60 mg via INTRAVENOUS

## 2020-03-09 MED ORDER — MIDAZOLAM HCL 5 MG/5ML IJ SOLN
INTRAMUSCULAR | Status: DC | PRN
  Administered 2020-03-09: 2 mg via INTRAVENOUS

## 2020-03-09 MED ORDER — PROPOFOL 10 MG/ML IV BOLUS
INTRAVENOUS | Status: DC | PRN
  Administered 2020-03-09: 100 mg via INTRAVENOUS
  Administered 2020-03-09: 200 mg via INTRAVENOUS

## 2020-03-09 MED ORDER — 0.9 % SODIUM CHLORIDE (POUR BTL) OPTIME
TOPICAL | Status: DC | PRN
  Administered 2020-03-09 (×2): 1000 mL

## 2020-03-09 MED ORDER — OXYCODONE HCL 5 MG PO TABS: 5.0000 mg | ORAL_TABLET | Freq: Once | ORAL | Status: DC | PRN

## 2020-03-09 MED ORDER — LIDOCAINE 2% (20 MG/ML) 5 ML SYRINGE
INTRAMUSCULAR | Status: AC
  Filled 2020-03-09: qty 5

## 2020-03-09 MED ORDER — FENTANYL CITRATE (PF) 100 MCG/2ML IJ SOLN
25.0000 ug | INTRAMUSCULAR | Status: DC | PRN
  Administered 2020-03-09: 50 ug via INTRAVENOUS
  Administered 2020-03-09: 25 ug via INTRAVENOUS

## 2020-03-09 SURGICAL SUPPLY — 112 items
BLADE CLIPPER SURG (BLADE) ×4 IMPLANT
BNDG COHESIVE 6X5 TAN STRL LF (GAUZE/BANDAGES/DRESSINGS) ×4 IMPLANT
CANISTER SUCT 3000ML PPV (MISCELLANEOUS) ×8 IMPLANT
CANNULA REDUC XI 12-8 STAPL (CANNULA) ×8
CANNULA REDUCER 12-8 DVNC XI (CANNULA) ×6 IMPLANT
CATH THORACIC 28FR (CATHETERS) IMPLANT
CATH THORACIC 28FR RT ANG (CATHETERS) IMPLANT
CATH THORACIC 36FR (CATHETERS) IMPLANT
CATH THORACIC 36FR RT ANG (CATHETERS) IMPLANT
CATH TROCAR 20FR (CATHETERS) IMPLANT
CHLORAPREP W/TINT 26 (MISCELLANEOUS) ×4 IMPLANT
CLIP VESOCCLUDE MED 6/CT (CLIP) IMPLANT
CNTNR URN SCR LID CUP LEK RST (MISCELLANEOUS) ×21 IMPLANT
CONN ST 1/4X3/8  BEN (MISCELLANEOUS) ×4
CONN ST 1/4X3/8 BEN (MISCELLANEOUS) ×3 IMPLANT
CONT SPEC 4OZ STRL OR WHT (MISCELLANEOUS) ×28
COVER SURGICAL LIGHT HANDLE (MISCELLANEOUS) IMPLANT
DEFOGGER SCOPE WARMER CLEARIFY (MISCELLANEOUS) ×4 IMPLANT
DERMABOND ADVANCED (GAUZE/BANDAGES/DRESSINGS) ×1
DERMABOND ADVANCED .7 DNX12 (GAUZE/BANDAGES/DRESSINGS) ×3 IMPLANT
DRAIN CHANNEL 28F RND 3/8 FF (WOUND CARE) ×4 IMPLANT
DRAIN CHANNEL 32F RND 10.7 FF (WOUND CARE) IMPLANT
DRAPE ARM DVNC X/XI (DISPOSABLE) ×12 IMPLANT
DRAPE COLUMN DVNC XI (DISPOSABLE) ×3 IMPLANT
DRAPE CV SPLIT W-CLR ANES SCRN (DRAPES) ×4 IMPLANT
DRAPE DA VINCI XI ARM (DISPOSABLE) ×16
DRAPE DA VINCI XI COLUMN (DISPOSABLE) ×4
DRAPE INCISE IOBAN 66X45 STRL (DRAPES) ×4 IMPLANT
DRAPE ORTHO SPLIT 77X108 STRL (DRAPES) ×4
DRAPE SURG ORHT 6 SPLT 77X108 (DRAPES) ×3 IMPLANT
DRAPE WARM FLUID 44X44 (DRAPES) ×4 IMPLANT
DRSG TEGADERM 2-3/8X2-3/4 SM (GAUZE/BANDAGES/DRESSINGS) ×4 IMPLANT
ELECT BLADE 6.5 EXT (BLADE) IMPLANT
ELECT REM PT RETURN 9FT ADLT (ELECTROSURGICAL) ×4
ELECTRODE REM PT RTRN 9FT ADLT (ELECTROSURGICAL) ×3 IMPLANT
GAUZE KITTNER 4X5 RF (MISCELLANEOUS) ×4 IMPLANT
GAUZE SPONGE 4X4 12PLY STRL (GAUZE/BANDAGES/DRESSINGS) ×4 IMPLANT
GLOVE BIO SURGEON STRL SZ7.5 (GLOVE) ×8 IMPLANT
GOWN STRL REUS W/ TWL LRG LVL3 (GOWN DISPOSABLE) ×6 IMPLANT
GOWN STRL REUS W/ TWL XL LVL3 (GOWN DISPOSABLE) ×9 IMPLANT
GOWN STRL REUS W/TWL 2XL LVL3 (GOWN DISPOSABLE) ×4 IMPLANT
GOWN STRL REUS W/TWL LRG LVL3 (GOWN DISPOSABLE) ×8
GOWN STRL REUS W/TWL XL LVL3 (GOWN DISPOSABLE) ×12
HEMOSTAT SURGICEL 2X14 (HEMOSTASIS) ×8 IMPLANT
IRRIGATION STRYKERFLOW (MISCELLANEOUS) ×3 IMPLANT
IRRIGATOR STRYKERFLOW (MISCELLANEOUS) ×4
KIT BASIN OR (CUSTOM PROCEDURE TRAY) ×4 IMPLANT
KIT SUCTION CATH 14FR (SUCTIONS) IMPLANT
KIT TURNOVER KIT B (KITS) ×4 IMPLANT
LOOP VESSEL SUPERMAXI WHITE (MISCELLANEOUS) IMPLANT
NEEDLE 22X1 1/2 (OR ONLY) (NEEDLE) ×4 IMPLANT
NEEDLE HYPO 25GX1X1/2 BEV (NEEDLE) ×4 IMPLANT
NS IRRIG 1000ML POUR BTL (IV SOLUTION) ×12 IMPLANT
OBTURATOR OPTICAL STANDARD 8MM (TROCAR)
OBTURATOR OPTICAL STND 8 DVNC (TROCAR)
OBTURATOR OPTICALSTD 8 DVNC (TROCAR) IMPLANT
PACK CHEST (CUSTOM PROCEDURE TRAY) ×4 IMPLANT
PAD ARMBOARD 7.5X6 YLW CONV (MISCELLANEOUS) ×20 IMPLANT
RELOAD STAPLER 2.5X45 WHT DVNC (STAPLE) ×9 IMPLANT
RELOAD STAPLER 3.5X45 BLU DVNC (STAPLE) ×15 IMPLANT
RELOAD STAPLER 4.3X45 GRN DVNC (STAPLE) ×3 IMPLANT
SEAL CANN UNIV 5-8 DVNC XI (MISCELLANEOUS) ×6 IMPLANT
SEAL XI 5MM-8MM UNIVERSAL (MISCELLANEOUS) ×8
SEALANT PROGEL (MISCELLANEOUS) IMPLANT
SEALANT SURG COSEAL 4ML (VASCULAR PRODUCTS) IMPLANT
SEALANT SURG COSEAL 8ML (VASCULAR PRODUCTS) IMPLANT
SET TUBE SMOKE EVAC HIGH FLOW (TUBING) ×4 IMPLANT
SOLUTION ELECTROLUBE (MISCELLANEOUS) IMPLANT
SPONGE INTESTINAL PEANUT (DISPOSABLE) IMPLANT
STAPLER 45 SUREFORM CVD (STAPLE) ×4
STAPLER 45 SUREFORM CVD DVNC (STAPLE) ×3 IMPLANT
STAPLER CANNULA SEAL DVNC XI (STAPLE) ×6 IMPLANT
STAPLER CANNULA SEAL XI (STAPLE) ×8
STAPLER RELOAD 2.5X45 WHITE (STAPLE) ×12
STAPLER RELOAD 2.5X45 WHT DVNC (STAPLE) ×9
STAPLER RELOAD 3.5X45 BLU DVNC (STAPLE) ×15
STAPLER RELOAD 3.5X45 BLUE (STAPLE) ×20
STAPLER RELOAD 4.3X45 GREEN (STAPLE) ×4
STAPLER RELOAD 4.3X45 GRN DVNC (STAPLE) ×3
STOPCOCK 4 WAY LG BORE MALE ST (IV SETS) ×4 IMPLANT
SUT MON AB 2-0 CT1 36 (SUTURE) IMPLANT
SUT PDS AB 1 CTX 36 (SUTURE) IMPLANT
SUT PROLENE 4 0 RB 1 (SUTURE)
SUT PROLENE 4-0 RB1 .5 CRCL 36 (SUTURE) IMPLANT
SUT SILK  1 MH (SUTURE) ×8
SUT SILK 1 MH (SUTURE) ×6 IMPLANT
SUT SILK 1 TIES 10X30 (SUTURE) IMPLANT
SUT SILK 2 0 SH (SUTURE) ×4 IMPLANT
SUT SILK 2 0SH CR/8 30 (SUTURE) IMPLANT
SUT VIC AB 1 CTX 36 (SUTURE)
SUT VIC AB 1 CTX36XBRD ANBCTR (SUTURE) IMPLANT
SUT VIC AB 2-0 CT1 27 (SUTURE) ×4
SUT VIC AB 2-0 CT1 TAPERPNT 27 (SUTURE) ×3 IMPLANT
SUT VIC AB 3-0 SH 27 (SUTURE) ×12
SUT VIC AB 3-0 SH 27X BRD (SUTURE) ×9 IMPLANT
SUT VICRYL 0 TIES 12 18 (SUTURE) ×4 IMPLANT
SUT VICRYL 0 UR6 27IN ABS (SUTURE) ×8 IMPLANT
SUT VICRYL 2 TP 1 (SUTURE) IMPLANT
SYR 10ML LL (SYRINGE) ×4 IMPLANT
SYR 20ML LL LF (SYRINGE) ×4 IMPLANT
SYR 50ML LL SCALE MARK (SYRINGE) ×4 IMPLANT
SYSTEM RETRIEVAL ANCHOR 15 (MISCELLANEOUS) ×4 IMPLANT
SYSTEM RETRIEVAL ANCHOR 8 (MISCELLANEOUS) ×4 IMPLANT
SYSTEM SAHARA CHEST DRAIN ATS (WOUND CARE) ×4 IMPLANT
TAPE CLOTH 4X10 WHT NS (GAUZE/BANDAGES/DRESSINGS) ×4 IMPLANT
TIP APPLICATOR SPRAY EXTEND 16 (VASCULAR PRODUCTS) IMPLANT
TOWEL GREEN STERILE (TOWEL DISPOSABLE) ×4 IMPLANT
TRAY FOLEY MTR SLVR 16FR STAT (SET/KITS/TRAYS/PACK) ×4 IMPLANT
TROCAR BLADELESS 15MM (ENDOMECHANICALS) IMPLANT
TROCAR XCEL 12X100 BLDLESS (ENDOMECHANICALS) ×4 IMPLANT
TUBING EXTENTION W/L.L. (IV SETS) ×4 IMPLANT
WATER STERILE IRR 1000ML POUR (IV SOLUTION) ×4 IMPLANT

## 2020-03-09 NOTE — Brief Op Note (Signed)
03/09/2020  12:43 PM  PATIENT:  [name]  64 y.o. male  PRE-OPERATIVE DIAGNOSIS:  LLL PULMONARY NODULE  POST-OPERATIVE DIAGNOSIS:  Non-Small Cell Neoplasm  PROCEDURE:  Procedure(s): XI ROBOTIC ASSISTED THORASCOPY - LOWER LEFT LOBE (LLL) WEDGE RESECTION AND LLL LOBECTOMY (Left) VIDEO BRONCHOSCOPY (N/A) INTERCOSTAL NERVE BLOCK (Left) LYMPH NODE DISSECTION (N/A)  SURGEON:  Surgeon(s) and Role:    * Lightfoot, Lucile Crater, MD - Primary  PHYSICIAN ASSISTANT:  Nicholes Rough, PA-C  ANESTHESIA:   general  EBL:  50 mL   BLOOD ADMINISTERED:none  DRAINS: ONE STRAIGHT BLAKE CHEST TUBE   LOCAL MEDICATIONS USED:  BUPIVICAINE   SPECIMEN:  Source of Specimen:  LEFT LOWER LOBE  DISPOSITION OF SPECIMEN:  PATHOLOGY  COUNTS:  YES  DICTATION: .Dragon Dictation  PLAN OF CARE: Admit to inpatient   PATIENT DISPOSITION:  PACU - hemodynamically stable.   Delay start of Pharmacological VTE agent (>24hrs) due to surgical blood loss or risk of bleeding: yes

## 2020-03-09 NOTE — Transfer of Care (Signed)
Immediate Anesthesia Transfer of Care Note  Patient: [name]  Procedure(s) Performed: XI ROBOTIC ASSISTED THORASCOPY - LOWER LEFT LOBE (LLL) WEDGE RESECTION AND LLL LOBECTOMY (Left Chest) VIDEO BRONCHOSCOPY (N/A ) INTERCOSTAL NERVE BLOCK (Left Chest) LYMPH NODE DISSECTION (N/A Chest)  Patient Location: PACU  Anesthesia Type:General  Level of Consciousness: awake, alert  and oriented  Airway & Oxygen Therapy: Patient Spontanous Breathing and Patient connected to face mask oxygen  Post-op Assessment: Report given to RN and Post -op Vital signs reviewed and stable  Post vital signs: Reviewed and stable  Last Vitals:  Vitals Value Taken Time  BP    Temp    Pulse 66 03/09/20 1300  Resp 17 03/09/20 1300  SpO2 95 % 03/09/20 1300  Vitals shown include unvalidated device data.  Last Pain:  Vitals:   03/09/20 0758  TempSrc:   PainSc: 0-No pain         Complications: No complications documented.

## 2020-03-09 NOTE — Op Note (Signed)
[address]       [place] [address]             [phone]        03/09/2020  Patient:  [name] Pre-Op Dx: Left lower lobe pulmonary nodule Post-op Dx: Left lower lobe non-small cell lung cancer Procedure: - Robotic assisted left video thoracoscopy -Left lower lobe wedge resection -Left lower lobe completion lobectomy - Mediastinal lymph node sampling - Intercostal nerve block  Surgeon and Role:      * Luccas Towell, Lucile Crater, MD - Primary    *T. Harriet Pho, PA-C- assisting  Anesthesia  general EBL: 100 ml Blood Administration: None Specimen: Left lower lobe wedge resection.  Left lower lobe.  Hilar lymph nodes.  Station 9 and 5 lymph nodes.  Drains: 72 F Blake chest tube in left chest Counts: correct   Indications:  64 year old male with a 1.6 cm PET avid left lower lobe pulmonary nodule concerning for primary lung cancer. I explained the risks, and benefits of proceeding with a bronchoscopy left robotic assisted thoracoscopy, wedge resection of the left lower lobe lesion and possible lobectomy. I further explained the importance of smoking cessation in the setting of a lung resection and that if he would be unable to stop smoking then radiation and chemotherapy would be the best course of action. He stated that he would prefer to have surgery and is tentatively scheduled for July 14. He understands the risks of a prolonged air leak given his significant smoking history.  Findings: There is no obvious parietal pleural thickening or indentation.  A generous wedge resection was taken in the area that corresponded with the nodules location on cross-sectional imaging.  The frozen section of the lesion did show non-small cell lung cancer.  We then proceeded with a completion lobectomy.  He had normal anatomy.  There is no obvious air leak at the completion of the case.  Operative Technique: After the risks, benefits and alternatives were thoroughly  discussed, the patient was brought to the operative theatre.  Anesthesia was induced, and the patient was then placed in a right decubitus position and was prepped and draped in normal sterile fashion.  An appropriate surgical pause was performed, and pre-operative antibiotics were dosed accordingly.  We began by placing our 4 robotic ports in the the 7th intercostal space targeting the hilum of the lung.  A 102mm assistant port was placed in the 9th intercostal space in the anterior axillary line.  The robot was then docked and all instruments were passed under direct visualization.    The lung was then retracted superiorly, and the inferior pulmonary ligament was divided.  The generous wedge resection was taken along the posterior segment of the lower lobe.  This area corresponded with the location of the pulmonary nodule on cross-sectional imaging.  The frozen section did reveal non-small cell lung cancer.  We then proceeded with the completion lobectomy.  The hilum was mobilized anteriorly and posteriorly.  We identified the inferior vein, and after careful isolation, it was divided with a vascular stapler.  We next moved to the fissure.  The artery was then divided with a vascular load stapler.  The bronchus to the lower lobe was then isolated.  After a test clamp, with good ventilation of the upper lobe, the bronchus was then divided.  The fissure was completed, and the specimen was passed into an endocatch bag.  It was removed from the superior access  site.    Lymph nodes were then sampled at levels 5 and 9.  The chest was irrigated, and an air leak test was performed.  An intercostal nerve block was performed under direct visualization.  A 28 F chest with then placed, and we watch the remaining lobes re-expand.  The skin and soft tissue were closed with absorbable suture    The patient tolerated the procedure without any immediate complications, and was transferred to the PACU in stable  condition.  [name] [name]

## 2020-03-09 NOTE — Anesthesia Procedure Notes (Addendum)
Procedure Name: Intubation Date/Time: 03/09/2020 9:07 AM Performed by: Trinna Post., CRNA Pre-anesthesia Checklist: Patient identified, Emergency Drugs available, Suction available, Patient being monitored and Timeout performed Patient Re-evaluated:Patient Re-evaluated prior to induction Oxygen Delivery Method: Circle system utilized Preoxygenation: Pre-oxygenation with 100% oxygen Induction Type: IV induction Ventilation: Mask ventilation without difficulty Laryngoscope Size: Glidescope and 4 Grade View: Grade I Endobronchial tube: Left, Double lumen EBT and EBT position confirmed by fiberoptic bronchoscope and 41 Fr Number of attempts: 2 Airway Equipment and Method: Rigid stylet and Video-laryngoscopy Placement Confirmation: ETT inserted through vocal cords under direct vision,  positive ETCO2 and breath sounds checked- equal and bilateral Tube secured with: Tape Dental Injury: Teeth and Oropharynx as per pre-operative assessment  Comments: DVL x1 with MAC4 by SRNA, good view but difficult to pass large 41Fr DLT so elected to use Glidescope to maintain better view of cords while passing DLT.

## 2020-03-09 NOTE — Anesthesia Postprocedure Evaluation (Signed)
Anesthesia Post Note  Patient: [name]  Procedure(s) Performed: XI ROBOTIC ASSISTED THORASCOPY - LOWER LEFT LOBE (LLL) WEDGE RESECTION AND LLL LOBECTOMY (Left Chest) VIDEO BRONCHOSCOPY (N/A ) INTERCOSTAL NERVE BLOCK (Left Chest) LYMPH NODE DISSECTION (N/A Chest)     Patient location during evaluation: PACU Anesthesia Type: General Level of consciousness: awake and alert Pain management: pain level controlled Vital Signs Assessment: post-procedure vital signs reviewed and stable Respiratory status: spontaneous breathing, nonlabored ventilation, respiratory function stable and patient connected to nasal cannula oxygen Cardiovascular status: blood pressure returned to baseline and stable Postop Assessment: no apparent nausea or vomiting Anesthetic complications: no   No complications documented.  Last Vitals:  Vitals:   03/09/20 1315 03/09/20 1355  BP: 125/72 (!) 119/56  Pulse: 63 (!) 57  Resp: (!) 21 11  Temp:    SpO2: 96% 96%    Last Pain:  Vitals:   03/09/20 1355  TempSrc:   PainSc: [place]

## 2020-03-09 NOTE — Anesthesia Procedure Notes (Signed)
Arterial Line Insertion Start/End7/14/2021 8:12 AM, 03/09/2020 8:16 AM Performed by: Audry Pili, MD, anesthesiologist  Patient location: Pre-op. Preanesthetic checklist: patient identified, IV checked, risks and benefits discussed, surgical consent, monitors and equipment checked, pre-op evaluation, timeout performed and anesthesia consent Lidocaine 1% used for infiltration and patient sedated Left, radial was placed Catheter size: 20 G Hand hygiene performed   Attempts: 2 (Previous attempts by CRNA unsuccessful) Procedure performed using ultrasound guided technique. Ultrasound Notes:anatomy identified, needle tip was noted to be adjacent to the nerve/plexus identified, no ultrasound evidence of intravascular and/or intraneural injection and image(s) printed for medical record Following insertion, dressing applied and Biopatch. Post procedure assessment: unchanged and normal  Patient tolerated the procedure well with no immediate complications.

## 2020-03-09 NOTE — Progress Notes (Signed)
Pt arrived from unit from PACU. VSS. Chest tube in place waterseal  Will continue to monitor. Pt. Oriented to unit   Phoebe Sharps, RN

## 2020-03-09 NOTE — H&P (Signed)
[address]       [place], [address]             [phone]       No events since his last clinic appointment Intermittent risk based off of his stress test  OR today for bronchoscopy, left RATS, wedge resection of the lower lobe, possible lobectomy  Per my clinic note.  [place] Record #[id number] Date of Birth: September 24, 1955  Referring: Melvenia Needles, NP Primary Care: [name], MD Primary Cardiologist: [name], MD  Chief Complaint:        Chief Complaint  Patient presents with  . Lung Lesion    Surgical consultation for LLL nodule    History of Present Illness:    [name] 64 y.o. male presents for surgical evaluation of a left lower lobe pulmonary nodule.  He has a long history of coronary artery disease and on surveillance he was noted to have this nodule.  PET/CT showed an avidity of 3.2 and this 1.6 cm cavitary lesion.  He states that he has been asymptomatic, in that he has not had any shortness of breath or coughs.  His weight has been stable and he denies any neurologic symptoms.  He continues to smoke about a pack a day, but states that he is willing to quit so that he can have surgery.      Zubrod Score: At the time of surgery this patient's most appropriate activity status/level should be described as: [x] ?    0    Normal activity, no symptoms [] ?    1    Restricted in physical strenuous activity but ambulatory, able to do out light work [] ?    2    Ambulatory and capable of self care, unable to do work activities, up and about               >50 % of waking hours                              [] ?    3    Only limited self care, in bed greater than 50% of waking hours [] ?    4    Completely disabled, no self care, confined to bed or chair [] ?    5    Moribund       Past Medical History:  Diagnosis Date  . Arthritis of knee    seeing Dr. Alfonso Ramus  . Coronary artery disease     . Depression with anxiety    hx of  . Exogenous obesity   . History of sleep apnea   . Hyperlipidemia   . Hypertension   . Panic attacks    hx of  . Smoker unmotivated to quit   . Varicose veins          Past Surgical History:  Procedure Laterality Date  . CARDIAC CATHETERIZATION  09/06/2008   stent to mid LAD and proximal LAD  . COLONOSCOPY N/A 05/01/2013   Procedure: COLONOSCOPY;  Surgeon: [name], MD;  Location: [facility];  Service: Endoscopy;  Laterality: N/A;  8:30 AM  . polyps of lung  2008   removed by [facility]  . TONSILLECTOMY           Family History  Problem Relation Age of Onset  . Heart disease Father  had cabg  . Esophageal cancer Father 60       esophageal  . Lung cancer Brother        lung cancer  . Colon cancer Neg Hx      Social History       Tobacco Use  Smoking Status Current Every Day Smoker  . Packs/day: 1.00  . Years: 40.00  . Pack years: 40.00  . Types: Cigarettes  . Start date: 1973  Smokeless Tobacco Never Used  Tobacco Comment   smokes 10 cigarettes  01/20/20 [facility]     Social History       Substance and Sexual Activity  Alcohol Use Yes  . Alcohol/week: 0.0 standard drinks   Comment: occasionally     No Known Allergies        Current Outpatient Medications  Medication Sig Dispense Refill  . clopidogrel (PLAVIX) 75 MG tablet TAKE 1 TABLET BY MOUTH DAILY 90 tablet 3  . Evolocumab (REPATHA SURECLICK) 419 MG/ML SOAJ Inject 140 mg into the skin every 14 (fourteen) days. 2 pen 11  . fluticasone (FLONASE) 50 MCG/ACT nasal spray Place 2 sprays into both nostrils daily. 16 g 6  . hydrochlorothiazide (HYDRODIURIL) 25 MG tablet Take 1 tablet (25 mg total) by mouth daily as needed (edema). 30 tablet 11  . HYDROcodone-acetaminophen (NORCO) 7.5-325 MG tablet Take 1 tablet by mouth every 6 (six) hours as needed for moderate pain. 30 tablet 0  . losartan (COZAAR) 100 MG tablet  TAKE 1 TABLET BY MOUTH EVERY DAY 90 tablet 2  . metoprolol succinate (TOPROL-XL) 25 MG 24 hr tablet TAKE 1/2 TABLET BY MOUTH EVERY DAY 45 tablet 2  . nitroGLYCERIN (NITROSTAT) 0.4 MG SL tablet Place 1 tablet (0.4 mg total) under the tongue every 5 (five) minutes as needed for chest pain. 25 tablet 3  . PARoxetine (PAXIL) 40 MG tablet TAKE 1 TABLET(40 MG) BY MOUTH DAILY 90 tablet 3   No current facility-administered medications for this visit.    Review of Systems  Constitutional: Negative.   HENT: Negative.   Respiratory: Negative for cough and shortness of breath.   Cardiovascular: Negative for chest pain.  Musculoskeletal: Positive for joint pain.  Neurological: Negative.      PHYSICAL EXAMINATION: BP 129/79 (BP Location: Right Arm, Patient Position: Sitting, Cuff Size: Large)   Pulse (!) 56   Temp 98.4 F (36.9 C) (Temporal)   Resp 20   Ht 6\' 1"  (1.854 m)   Wt (!) 304 lb (137.9 kg)   SpO2 96% Comment: RA  BMI 40.11 kg/m  Physical Exam Constitutional:      Appearance: Normal appearance.  HENT:     Head: Normocephalic and atraumatic.  Eyes:     General: No scleral icterus.    Conjunctiva/sclera: Conjunctivae normal.  Cardiovascular:     Rate and Rhythm: Normal rate.  Pulmonary:     Effort: Pulmonary effort is normal. No respiratory distress.  Abdominal:     General: There is no distension.  Musculoskeletal:        General: Normal range of motion.     Cervical back: Normal range of motion.  Skin:    General: Skin is warm and dry.  Neurological:     General: No focal deficit present.     Mental Status: He is alert and oriented to person, place, and time.     Diagnostic Studies & Laboratory data:     Recent Radiology Findings:    Imaging Results  NM  PET Image Initial (PI) Skull Base To Thigh  Result Date: 02/02/2020 CLINICAL DATA:  Initial treatment strategy for left lower lobe pulmonary nodule. EXAM: NUCLEAR MEDICINE PET SKULL BASE TO THIGH  TECHNIQUE: 15.2 mCi F-18 FDG was injected intravenously. Full-ring PET imaging was performed from the skull base to thigh after the radiotracer. CT data was obtained and used for attenuation correction and anatomic localization. Fasting blood glucose: 97 mg/dl COMPARISON:  Low-dose lung cancer screening CT chest dated 01/13/2020 FINDINGS: Mediastinal blood pool activity: SUV max 3.6 Liver activity: SUV max NA NECK: No hypermetabolic cervical lymphadenopathy. Incidental CT findings: none CHEST: 10 x 16 mm thick-walled cavitary lesion in the posterior left lower lobe (series 8/image 40), max SUV 3.2, suspicious for primary bronchogenic neoplasm such as squamous cell carcinoma. Mild paraseptal emphysematous changes at the lung apices. No hypermetabolic thoracic lymphadenopathy. Incidental CT findings: Atherosclerotic calcifications of the aortic arch. 3 vessel coronary atherosclerosis. ABDOMEN/PELVIS: No abnormal hypermetabolism in the liver, spleen, pancreas, or adrenal glands. No hypermetabolic abdominopelvic lymphadenopathy. Incidental CT findings: Left renal cysts. Atherosclerotic calcifications the abdominal aorta and branch vessels. Sigmoid diverticulosis, without evidence of diverticulitis. SKELETON: No focal hypermetabolic activity to suggest skeletal metastasis. Incidental CT findings: Degenerative changes of the visualized thoracolumbar spine. IMPRESSION: 10 x 16 mm hypermetabolic thick-walled cavitary lesion in the posterior left lower lobe, suspicious for primary bronchogenic neoplasm such as squamous cell carcinoma. No findings suspicious for metastatic disease. Electronically Signed   By: Julian Hy M.D.   On: 02/02/2020 12:53        I have independently reviewed the above radiology studies  and reviewed the findings with the patient.   Recent Lab Findings: Recent Labs       Lab Results  Component Value Date   WBC 8.3 12/08/2019   HGB 16.4 12/08/2019   HCT 48.3 12/08/2019    PLT 180 12/08/2019   GLUCOSE 96 12/08/2019   CHOL 135 12/08/2019   TRIG 80 12/08/2019   HDL 40 12/08/2019   LDLDIRECT 178 (H) 11/18/2017   LDLCALC 79 12/08/2019   ALT 16 12/08/2019   AST 13 12/08/2019   NA 140 12/08/2019   K 4.4 12/08/2019   CL 105 12/08/2019   CREATININE 0.80 12/08/2019   BUN 14 12/08/2019   CO2 27 12/08/2019   TSH 1.71 12/21/2015       PFTs:  -DLCO: 117%  Problem List: 1.6 cm cavitary left lower lobe pulmonary nodule concerning for primary lung cancer History of coronary artery disease currently on Plavix.  Status post PCI in 2010 Current smoker, but states that he is ready to quit Morbid obesity. Obstructive sleep apnea  Assessment / Plan:   64 year old male with a 1.6 cm PET avid left lower lobe pulmonary nodule concerning for primary lung cancer.  I explained the risks, and benefits of proceeding with a bronchoscopy left robotic assisted thoracoscopy, wedge resection of the left lower lobe lesion and possible lobectomy.  I further explained the importance of smoking cessation in the setting of a lung resection and that if he would be unable to stop smoking then radiation and chemotherapy would be the best course of action.  He stated that he would prefer to have surgery and is tentatively scheduled for July 14.  He understands the risks of a prolonged air leak given his significant smoking history.  I have ordered a stress test and will also touch base with Dr. Johnsie Cancel to ensure that he is fit for surgery from a cardiovascular standpoint.  He will also need to be off of his Plavix for 5 days prior to surgery.

## 2020-03-09 NOTE — Anesthesia Procedure Notes (Deleted)
Arterial Line Insertion Start/End7/14/2021 8:15 AM Performed by: Audry Pili, MD, anesthesiologist  Patient location: Pre-op. Preanesthetic checklist: patient identified, IV checked, site marked, risks and benefits discussed, surgical consent, monitors and equipment checked, pre-op evaluation, timeout performed and anesthesia consent Left, radial was placed Catheter size: 20 G Hand hygiene performed  and maximum sterile barriers used   Attempts: 2 Procedure performed using ultrasound guided technique. Ultrasound Notes:anatomy identified, needle tip was noted to be adjacent to the nerve/plexus identified and no ultrasound evidence of intravascular and/or intraneural injection Following insertion, dressing applied and Biopatch. Post procedure assessment: normal  Patient tolerated the procedure well with no immediate complications.

## 2020-03-09 NOTE — Progress Notes (Signed)
Patient has home CPAP set up at bedside. Patient will place self on when ready.

## 2020-03-10 ENCOUNTER — Encounter (HOSPITAL_COMMUNITY): Payer: Self-pay | Admitting: Thoracic Surgery (Cardiothoracic Vascular Surgery)

## 2020-03-10 ENCOUNTER — Inpatient Hospital Stay (HOSPITAL_COMMUNITY): Payer: BC Managed Care – PPO

## 2020-03-10 LAB — BASIC METABOLIC PANEL
Anion gap: 8 (ref 5–15)
BUN: 12 mg/dL (ref 8–23)
CO2: 26 mmol/L (ref 22–32)
Calcium: 8.7 mg/dL — ABNORMAL LOW (ref 8.9–10.3)
Chloride: 106 mmol/L (ref 98–111)
Creatinine, Ser: 0.82 mg/dL (ref 0.61–1.24)
GFR calc Af Amer: >60 mL/min (ref >60)
GFR calc non Af Amer: >60 mL/min (ref >60)
Glucose, Bld: 142 mg/dL — ABNORMAL HIGH (ref 70–99)
Potassium: 4.2 mmol/L (ref 3.5–5.1)
Sodium: 140 mmol/L (ref 135–145)

## 2020-03-10 LAB — CBC
HCT: 42.1 % (ref 39.0–52.0)
Hemoglobin: 13.7 g/dL (ref 13.0–17.0)
MCH: 32.1 pg (ref 26.0–34.0)
MCHC: 32.5 g/dL (ref 30.0–36.0)
MCV: 98.6 fL (ref 80.0–100.0)
Platelets: 127 10*3/uL — ABNORMAL LOW (ref 150–400)
RBC: 4.27 MIL/uL (ref 4.22–5.81)
RDW: 12.1 % (ref 11.5–15.5)
WBC: 12.3 10*3/uL — ABNORMAL HIGH (ref 4.0–10.5)
nRBC: 0 % (ref 0.0–0.2)

## 2020-03-10 LAB — BLOOD GAS, ARTERIAL
Acid-Base Excess: 1.3 mmol/L (ref 0.0–2.0)
Bicarbonate: 25.9 mmol/L (ref 20.0–28.0)
Drawn by: 565021
FIO2: 21
O2 Saturation: 95.9 %
Patient temperature: 36.5
pCO2 arterial: 44.1 mmHg (ref 32.0–48.0)
pH, Arterial: 7.384 (ref 7.350–7.450)
pO2, Arterial: 74.1 mmHg — ABNORMAL LOW (ref 83.0–108.0)

## 2020-03-10 LAB — GLUCOSE, CAPILLARY
Glucose-Capillary: 133 mg/dL — ABNORMAL HIGH (ref 70–99)
Glucose-Capillary: 158 mg/dL — ABNORMAL HIGH (ref 70–99)

## 2020-03-10 LAB — SURGICAL PATHOLOGY

## 2020-03-10 MED ORDER — TRAMADOL HCL 50 MG PO TABS: 50.0000 mg | ORAL_TABLET | Freq: Four times a day (QID) | ORAL | 0 refills | Status: AC | PRN

## 2020-03-10 NOTE — Progress Notes (Signed)
Mobility Specialist - Progress Note   03/10/20 1441  Mobility  Activity Ambulated in hall  Level of Assistance Independent  Assistive Device None  Distance Ambulated (ft) 470 ft  Mobility Response Tolerated well  Mobility performed by Mobility specialist  $Mobility charge 1 Mobility    Pre-mobility: 65 HR, 93% SpO2 During mobility: 78 HR, 93% SpO2 Post-mobility: 64 HR, 94% SpO2   Jadae Steinke Transport planner Phone: [phone]

## 2020-03-10 NOTE — Progress Notes (Signed)
Right chest tube removed, per order. Pt tolerated well. Minimal amount of serosanguinous drainage present at site, prior to and after removal. Gauze dressing placed. Pt in bed with call light within reach, Wife at bedside.

## 2020-03-10 NOTE — Discharge Instructions (Signed)
[facility] OFFICE  [phone]   Robot-Assisted Thoracic Surgery, Care After This sheet gives you information about how to care for yourself after your procedure. Your health care provider may also give you more specific instructions. If you have problems or questions, contact your health care provider. What can I expect after the procedure? After the procedure, it is common to have:  Some pain and aches in the area of your surgical cuts (incisions).  Pain when breathing in (inhaling) and coughing.  Tiredness (fatigue).  Trouble sleeping.  Constipation. Follow these instructions at home: Medicines  Take over-the-counter and prescription medicines only as told by your health care provider.  If you were prescribed an antibiotic medicine, take it as told by your health care provider. Do not stop taking the antibiotic even if you start to feel better.  Talk with your health care provider about safe and effective ways to manage pain after your procedure. Pain management should fit your specific health needs.  Take prescription pain medicine before pain becomes severe. Relieving and controlling your pain will make breathing easier for you. Activity  Return to your normal activities as told by your health care provider. Ask your health care provider what activities are safe for you.  Do not lift anything that is heavier than 10 lb (4.5 kg), or the limit that you are told, until your health care provider says that it is safe.  Avoid sitting for a long time without moving. Get up and move around one or more times every few hours. Bathing  Do not take baths, swim, or use a hot tub until your health care provider approves. You may take showers. Incision care  Follow instructions from your health care provider about how to take care of your incision(s). Make sure you: ? Wash your hands with soap and water before you change your bandage (dressing). If soap and water are not available, use hand  sanitizer. ? Change your dressing as told by your health care provider. ? Leave stitches (sutures), skin glue, or adhesive strips in place. These skin closures may need to stay in place for 2 weeks or longer. If adhesive strip edges start to loosen and curl up, you may trim the loose edges. Do not remove adhesive strips completely unless your health care provider tells you to do that.  Check your incision area every day for signs of infection. Check for: ? Redness, swelling, or pain. ? Fluid or blood. ? Warmth. ? Pus or a bad smell. Driving  NO DRIVING TILL SEEN BY THE SURGEON.  Do not drive or use heavy machinery while taking prescription pain medicine. Eating and drinking  Follow instructions from your health care provider about eating or drinking restrictions. These will vary depending on what procedure you had. Your health care provider may recommend: ? A liquid diet or soft diet for the first few days. ? Meals that are smaller and more frequent. ? A diet of fruits, vegetables, whole grains, and low-fat proteins. ? Limiting foods that are high in processed sugar and fat, including fried and sweet foods. Pneumonia prevention   Do not use any products that contain nicotine or tobacco, such as cigarettes and e-cigarettes. If you need help quitting, ask your health care provider.  Avoid secondhand smoke.  Do deep breathing exercises and cough regularly as directed. This helps to clear mucus and prevent pneumonia. If it hurts to cough, try one of these methods to ease your pain when you cough: ?  Hold a pillow against your chest. ? Place the palms of both hands over your incisions (use splinting).  Use an incentive spirometer as directed. This device measures how much air your lungs are getting with each breath. Using this will improve your breathing.  Do pulmonary rehabilitation as directed. This is a program that includes exercise, education, and support. General  instructions  Wear compression stockings as told by your health care provider. These stockings help to prevent blood clots and reduce swelling in your legs.  If you have a drainage tube: ? Follow instructions from your health care provider about how to take care of it. ? Do not travel by airplane after your tube is removed until your health care provider tells you it is safe.  To prevent or treat constipation while you are taking prescription pain medicine, your health care provider may recommend that you: ? Drink enough fluid to keep your urine pale yellow. ? Take over-the-counter or prescription medicines. ? Eat foods that are high in fiber, such as fresh fruits and vegetables, whole grains, and beans. ? Limit foods that are high in fat and processed sugars, such as fried and sweet foods.  Keep all follow-up visits as told by your health care provider. This is important. Contact a health care provider if:  You have redness, swelling, or pain around an incision.  You have fluid or blood coming from an incision.  An incision feels warm to the touch.  You have pus or a bad smell coming from an incision.  You have a fever.  You cannot eat or drink without vomiting.  Your prescription pain medicine is not controlling your pain. Get help right away if:  You have chest pain.  Your heart is beating quickly.  You have trouble breathing.  You have trouble speaking.  You are confused.  You feel weak or dizzy, or you faint. These symptoms may represent a serious problem that is an emergency. Do not wait to see if the symptoms will go away. Get medical help right away. Call your local emergency services (911 in the [place] Do not drive yourself to the hospital. Summary  Talk with your health care provider about safe and effective ways to manage pain after your procedure. Pain management should fit your specific health needs.  Return to your normal activities as told by your health  care provider. Ask your health care provider what activities are safe for you.  Do deep breathing exercises and cough regularly as directed. This helps to clear mucus and prevent pneumonia. If it hurts to cough, ease pain by holding a pillow against your chest or by placing the palms of both hands over your incisions (splinting). This information is not intended to replace advice given to you by your health care provider. Make sure you discuss any questions you have with your health care provider. Document Revised: 06/12/2019 Document Reviewed: 12/17/2016 Elsevier Patient Education  [employer].

## 2020-03-10 NOTE — Progress Notes (Signed)
Plan of care reviewed. Pt's hemodynamically stable. Remained afebrile. No respiratory distress. On CPAP at night, SPO2 95-96%. EKG sinus brady to NSR, HR 53-69, BP within normal limits.  Chest tube under water sealed, total output marked at 250 ml in 24 hours, appeared bright red drainage. Pain tolerated well with Toradol and Tramadol, Pt refused Tylenol, stated had hive years ago after taking Tylenol.  No immediate distress tonight. We will continue to monitor.  [name],[name]

## 2020-03-10 NOTE — Progress Notes (Signed)
[address]       [place] [address]             [phone]      1 Day Post-Op Procedure(s) (LRB): XI ROBOTIC ASSISTED THORASCOPY - LOWER LEFT LOBE (LLL) WEDGE RESECTION AND LLL LOBECTOMY (Left) VIDEO BRONCHOSCOPY (N/A) INTERCOSTAL NERVE BLOCK (Left) LYMPH NODE DISSECTION (N/A) Subjective: Feels pretty well  Objective: Vital signs in last 24 hours: Temp:  [97.5 F (36.4 C)-98.4 F (36.9 C)] 97.9 F (36.6 C) (07/15 0815) Pulse Rate:  [54-69] 56 (07/15 0815) Cardiac Rhythm: Sinus bradycardia (07/15 0700) Resp:  [10-21] 18 (07/15 0815) BP: (112-137)/(56-75) 132/68 (07/15 0815) SpO2:  [94 %-98 %] 97 % (07/15 0815) Arterial Line BP: (133-136)/(54-63) 136/55 (07/14 1440)  Hemodynamic parameters for last 24 hours:    Intake/Output from previous day: 07/14 0701 - 07/15 0700 In: 1796.2 [P.O.:490; I.V.:1156.2; IV Piggyback:150.1] Out: 1400 [Urine:1100; Blood:50; Chest Tube:250] Intake/Output this shift: No intake/output data recorded.  General appearance: alert, cooperative and no distress Heart: regular rate and rhythm Lungs: minor crackles left base Abdomen: benign Extremities: no edema Wound: incis healing well  Lab Results: Recent Labs    03/07/20 1500 03/10/20 0412  WBC 7.2 12.3*  HGB 15.6 13.7  HCT 46.8 42.1  PLT 147* 127*   BMET:  Recent Labs    03/07/20 1500 03/10/20 0412  NA 139 140  K 3.7 4.2  CL 102 106  CO2 27 26  GLUCOSE 97 142*  BUN 11 12  CREATININE 0.88 0.82  CALCIUM 9.1 8.7*    PT/INR:  Recent Labs    03/07/20 1500  LABPROT 13.0  INR 1.0   ABG    Component Value Date/Time   PHART 7.384 03/10/2020 0533   HCO3 25.9 03/10/2020 0533   TCO2 29 08/24/2008 1340   O2SAT 95.9 03/10/2020 0533   CBG (last 3)  Recent Labs    03/09/20 1628 03/09/20 2202 03/10/20 0604  GLUCAP 132* 215* 133*    Meds Scheduled Meds: . acetaminophen  1,000 mg Oral Q6H   Or  . acetaminophen (TYLENOL) oral liquid 160 mg/5  mL  1,000 mg Oral Q6H  . bisacodyl  10 mg Oral Daily  . bupivacaine liposome  20 mL Infiltration Once  . enoxaparin (LOVENOX) injection  40 mg Subcutaneous Daily  . insulin aspart  0-24 Units Subcutaneous TID AC & HS  . PARoxetine  40 mg Oral QHS  . senna-docusate  1 tablet Oral QHS   Continuous Infusions: . sodium chloride 10 mL/hr at 03/09/20 1937   PRN Meds:.Place/Maintain arterial line **AND** sodium chloride, ketorolac, ondansetron (ZOFRAN) IV, traMADol  Xrays DG Chest Port 1 View  Result Date: 03/09/2020 CLINICAL DATA:  Status post robot assisted thoracic surgery. EXAM: PORTABLE CHEST 1 VIEW COMPARISON:  March 07, 2020. FINDINGS: Stable cardiomediastinal silhouette. No pneumothorax or pleural effusion is noted. Left-sided chest tube is noted. Mild bibasilar subsegmental atelectasis is noted. Bony thorax is unremarkable. IMPRESSION: Left-sided chest tube is noted without evidence of pneumothorax. Mild bibasilar subsegmental atelectasis is noted. Electronically Signed   By: Marijo Conception M.D.   On: 03/09/2020 13:23    Assessment/Plan: S/P Procedure(s) (LRB): XI ROBOTIC ASSISTED THORASCOPY - LOWER LEFT LOBE (LLL) WEDGE RESECTION AND LLL LOBECTOMY (Left) VIDEO BRONCHOSCOPY (N/A) INTERCOSTAL NERVE BLOCK (Left) LYMPH NODE DISSECTION (N/A)  1 doing well 2 VSS , no fever 3 sats good on RA 4 CXR hasn't been done 5 CT - no air  leak, 250 cc drainage- poss remove today if XRAY ok 6 no anemia 7 minor reactive leukocytosis  8 lytes, renal fxn ok 9 poss home later today or potentially tomorrow 10 mobilize/routine pulm toilet   LOS: 1 day    John Giovanni PA-C Pager [phone] 03/10/2020

## 2020-03-10 NOTE — Progress Notes (Signed)
Pt discharging home with wife. IV and telemetry box removed. Pt has all belongings packed. Pt and pt's wife received discharge instructions and all questions were answered.

## 2020-03-10 NOTE — Discharge Summary (Signed)
Physician Discharge Summary  Patient ID: PASTOR SGRO MRN: [id number] DOB/AGE: 12-19-1955 64 y.o.  Admit date: 03/09/2020 Discharge date: 03/10/2020  Admission Diagnoses: Left lower lobe lung mass  Discharge Diagnoses:  Active Problems:   S/P robot-assisted surgical procedure   Patient Active Problem List   Diagnosis Date Noted  . S/P robot-assisted surgical procedure 03/09/2020  . Vitamin D deficiency 12/27/2015  . Essential hypertension 12/21/2015  . Varicose veins of left lower extremity with complications 42/59/5638  . Varicose veins of right lower extremity with complications 75/64/3329  . Varicose veins of bilateral lower extremities with other complications 51/88/4166  . Varicose veins of leg with complications 02/24/1600  . Varicose veins of both lower extremities 03/03/2015  . Smoker unmotivated to quit   . Malaise and fatigue 01/03/2012  . Vertigo 01/03/2012  . Ischemic heart disease 06/25/2011  . Hypercholesterolemia 06/25/2011  . Panic attacks   . Arthritis of knee   . Exogenous obesity   . History of sleep apnea   . Depression with anxiety     History of Present Illness: Woodson Macha [id number] y.o.malepresents for surgical evaluation of a left lower lobe pulmonary nodule. He has a long history of coronary artery disease and on surveillance he was noted to have this nodule. PET/CT showed an avidity of 3.2 and this 1.6 cm cavitary lesion. He states that he has been asymptomatic, inthat he has not had any shortness of breath or coughs. His weight has been stable and he denies any neurologic symptoms. He continues to smoke about a pack a day, but states that he is willing to quit so that he can have surgery.  The patient and his studies were evaluated by Dr. [name] who felt the patient would be a candidate for robotic assisted lobectomy.  He was admitted this hospitalization for the procedure.    Discharged Condition: good  Hospital Course: Patient  was admitted electively and on 03/09/2020 he was taken the operating room where he underwent the below described procedure.  He tolerated it well and was taken to the postanesthesia care unit in stable condition.  Postoperative hospital course:  Patient has remained hemodynamically stable with some sinus bradycardia.  His oxygen has been weaned and he maintains good saturations on room air.  He does not have a postoperative anemia.  His renal function has remained within normal limits.  Postoperative chest x-ray shows no pntx and chest tube was removed on POD#1.  Incisions are noted to be healing well without evidence of infection.  He is tolerating gradually increasing activities including pulmonary toilet with incentive spirometry well.  Diet has been advanced per usual protocols.  Final pathology is currently pending.  At the time of discharge the patient is felt to be quite stable.  Consults: None  Significant Diagnostic Studies: Routine postoperative labs and chest x-rays.  Treatments: surgery:   03/09/2020  Patient:  [name] Pre-Op Dx: Left lower lobe pulmonary nodule Post-op Dx: Left lower lobe non-small cell lung cancer Procedure: - Robotic assisted left video thoracoscopy -Left lower lobe wedge resection -Left lower lobe completion lobectomy - Mediastinal lymph node sampling - Intercostal nerve block  Surgeon and Role:      * Lightfoot, Lucile Crater, MD - Primary    *T. Harriet Pho, PA-C- assisting  Anesthesia  general EBL: 100 ml Blood Administration: None Specimen: Left lower lobe wedge resection.  Left lower lobe.  Hilar lymph nodes.  Station 9 and 5 lymph nodes. Discharge Exam: Blood pressure 119/65,  pulse (!) 59, temperature 98 F (36.7 C), temperature source Oral, resp. rate 19, height 6\' 1"  (1.854 m), weight (!) 138.3 kg, SpO2 95 %.  General appearance: alert, cooperative and no distress Heart: regular rate and rhythm Lungs: minor crackles left base Abdomen:  benign Extremities: no edema Wound: incis healing well   Disposition: Discharge disposition: 01-Home or Self Care      Discharge Instructions    Discharge patient   Complete by: As directed    Discharge disposition: 01-Home or Self Care   Discharge patient date: 03/10/2020     Allergies as of 03/10/2020   No Known Allergies     Medication List    TAKE these medications   clopidogrel 75 MG tablet Commonly known as: PLAVIX TAKE 1 TABLET BY MOUTH DAILY   fluticasone 50 MCG/ACT nasal spray Commonly known as: FLONASE Place 2 sprays into both nostrils daily. What changed:   when to take this  reasons to take this   hydrochlorothiazide 25 MG tablet Commonly known as: HYDRODIURIL Take 1 tablet (25 mg total) by mouth daily as needed (edema).   losartan 100 MG tablet Commonly known as: COZAAR TAKE 1 TABLET BY MOUTH EVERY DAY What changed: when to take this   metoprolol succinate 25 MG 24 hr tablet Commonly known as: TOPROL-XL TAKE 1/2 TABLET BY MOUTH EVERY DAY   nicotine polacrilex 4 MG gum Commonly known as: NICORETTE Take 4 mg by mouth daily as needed for smoking cessation.   nitroGLYCERIN 0.4 MG SL tablet Commonly known as: NITROSTAT Place 1 tablet (0.4 mg total) under the tongue every 5 (five) minutes as needed for chest pain.   OPTIVE 0.5-0.9 % ophthalmic solution Generic drug: carboxymethylcellul-glycerin Place 1 drop into both eyes daily as needed for dry eyes. refresh   PARoxetine 40 MG tablet Commonly known as: PAXIL TAKE 1 TABLET(40 MG) BY MOUTH DAILY What changed: See the new instructions.   Repatha SureClick 924 MG/ML Soaj Generic drug: Evolocumab Inject 140 mg into the skin every 14 (fourteen) days.   traMADol 50 MG tablet Commonly known as: ULTRAM Take 1-2 tablets (50-100 mg total) by mouth every 6 (six) hours as needed for up to 7 days (mild pain).       Follow-up Information    Lightfoot, Lucile Crater, MD Follow up.   Specialty:  Cardiothoracic Surgery Why: Please see discharge paperwork for follow-up appointment with surgeon.  Obtain a chest x-ray at [facility] 1/2-hour prior to this appointment.  It is in the same office complex on the first floor. Contact information: [place] [address] [phone]               Signed: John Giovanni PA-C 03/10/2020, 1:51 PM

## 2020-03-15 ENCOUNTER — Other Ambulatory Visit: Payer: Self-pay | Admitting: Thoracic Surgery (Cardiothoracic Vascular Surgery)

## 2020-03-15 DIAGNOSIS — Z9889 Other specified postprocedural states: Secondary | ICD-10-CM

## 2020-03-16 ENCOUNTER — Encounter: Payer: Self-pay | Admitting: Thoracic Surgery (Cardiothoracic Vascular Surgery)

## 2020-03-16 ENCOUNTER — Ambulatory Visit (INDEPENDENT_AMBULATORY_CARE_PROVIDER_SITE_OTHER): Payer: Self-pay | Admitting: Thoracic Surgery (Cardiothoracic Vascular Surgery)

## 2020-03-16 ENCOUNTER — Ambulatory Visit
Admission: RE | Admit: 2020-03-16 | Discharge: 2020-03-16 | Disposition: A | Discharge status: Home / Self Care | Payer: BC Managed Care – PPO | Source: Ambulatory Visit | Attending: Thoracic Surgery (Cardiothoracic Vascular Surgery) | Admitting: Thoracic Surgery (Cardiothoracic Vascular Surgery)

## 2020-03-16 ENCOUNTER — Other Ambulatory Visit: Payer: Self-pay

## 2020-03-16 VITALS — BP 124/81 | HR 77 | Temp 97.6°F | Resp 20 | Ht 73.0 in | Wt 300.0 lb

## 2020-03-16 DIAGNOSIS — Z9889 Other specified postprocedural states: Secondary | ICD-10-CM

## 2020-03-16 DIAGNOSIS — R911 Solitary pulmonary nodule: Secondary | ICD-10-CM

## 2020-03-16 DIAGNOSIS — R05 Cough: Secondary | ICD-10-CM | POA: Diagnosis not present

## 2020-03-16 NOTE — Progress Notes (Signed)
      [address]       [place] [address]             [phone]        Adley W Buttler [place] Medical Record #[id number] Date of Birth: 03-30-1956  Referring: Melvenia Needles, NP Primary Care: [name], MD Primary Cardiologist:Peter Johnsie Cancel, MD  Reason for visit:   follow-up  History of Present Illness:     Mr. Spoelstra presents for his first follow-up after undergoing a robotic assisted lobectomy.  His pathology did show a 1.5 cm adenocarcinoma with negative lymph nodes.  He is done well at home, he continues to use his incentive spirometer and his pain is well controlled.  He has developed some blisters likely from the tape around the chest tube site.  Physical Exam: BP 124/81 (BP Location: Left Arm, Patient Position: Sitting, Cuff Size: Large)   Pulse 77   Temp 97.6 F (36.4 C) (Temporal)   Resp 20   Ht 6\' 1"  (1.854 m)   Wt 300 lb (136.1 kg)   SpO2 97% Comment: RA  BMI 39.58 kg/m   Alert NAD Incision clean, 3 small blisters around the chest tube site.   Abdomen soft, ND No peripheral edema   Diagnostic Studies & Laboratory data: CXR: Clear Path:   A. LUNG, LEFT LOWER LOBE, WEDGE RESECTION:  - Adenocarcinoma, moderately differentiated, spanning 1.5 cm.  - See part C for final margin status.  - Tumor is limited to lung.  - See oncology table.   B. LUNG, LEFT LOWER LOBE, LOBECTOMY:  - Focal adenocarcinoma.  - Bronchial and vascular margins are negative.  - Tumor is limited to lung.  - Two of two lymph nodes negative for carcinoma (0/2).  - See oncology table.   C. LYMPH NODE, LEVEL 9, BIOPSY:  - One of one lymph nodes negative for carcinoma (0/1).   D. LYMPH NODE, HILAR #1, BIOPSY:  - One of one lymph nodes negative for carcinoma (0/1).  - Benign lung tissue.   E. LYMPH NODE, HILAR #2, BIOPSY:  - One of one lymph nodes negative for carcinoma (0/1).   F. LYMPH NODE, LEVEL 5, BIOPSY:  - One of one lymph nodes negative  for carcinoma (0/1).   G. LYMPH NODE, HILAR #3, BIOPSY:  - One of one lymph nodes negative for carcinoma (0/1).   H. LYMPH NODE, HILAR #4, BIOPSY:  - Benign lung and pleura.  - No lymphoid tissue.   I. LYMPH NODE, HILAR #5, BIOPSY:  - Benign lung tissue.  - No lymphoid tissue.     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  His case will be discussed in our tumor board but given his staging is likely would not require any adjuvant therapy.  He will return in 1 month for standard follow-up appointment. The importance of smoking cessation has been stressed again with him.    [name] 03/16/2020 11:46 AM

## 2020-03-17 ENCOUNTER — Other Ambulatory Visit: Payer: Self-pay | Admitting: *Deleted

## 2020-03-17 NOTE — Progress Notes (Signed)
The proposed treatment discussed in caner conference 03/17/20 is for discussion purpose only and is not a binding recommendation.  The patient was not physically examined nor present for their treatment options.  Therefore, final treatment plans cannot be decided.

## 2020-03-21 ENCOUNTER — Ambulatory Visit: Payer: BC Managed Care – PPO | Admitting: Acute Care

## 2020-03-21 ENCOUNTER — Encounter: Payer: Self-pay | Admitting: Acute Care

## 2020-03-21 ENCOUNTER — Other Ambulatory Visit: Payer: Self-pay

## 2020-03-21 VITALS — BP 120/80 | HR 60 | Temp 97.2°F | Ht 73.0 in | Wt 305.6 lb

## 2020-03-21 DIAGNOSIS — C3492 Malignant neoplasm of unspecified part of left bronchus or lung: Secondary | ICD-10-CM

## 2020-03-21 DIAGNOSIS — Z8669 Personal history of other diseases of the nervous system and sense organs: Secondary | ICD-10-CM | POA: Diagnosis not present

## 2020-03-21 NOTE — Patient Instructions (Addendum)
It is good to see you today. Congratulations on quitting smoking.  Keep up the good work.  We will place an order for a home sleep study. You will get a call from the office to schedule.  Continue on CPAP at bedtime. You appear to be benefiting from the treatment  Goal is to wear for at least 6 hours each night for maximal clinical benefit. Continue to work on weight loss, as the link between excess weight  and sleep apnea is well established.   Remember to establish a good bedtime routine, and work on sleep hygiene.  Limit daytime naps , avoid stimulants such as caffeine and nicotine close to bedtime, exercise daily to promote sleep quality, avoid heavy , spicy, fried , or rich foods before bed. Ensure adequate exposure to natural light during the day,establish a relaxing bedtime routine with a pleasant sleep environment ( Bedroom between 60 and 67 degrees, turn off bright lights , TV or device screens screens , consider black out curtains or white noise machines) Do not drive if sleepy. Remember to clean mask, tubing, filter, and reservoir once weekly with soapy water.   Follow up with Dr. [name] as is scheduled.  Follow up with Dr. Cecile Sheerer  Please eat slowly. Take plenty of sips of liquid while eating. Let us know if the choking on food does not get better and we will do a swallow study.  Follow up with Dr. Shearon Stalls in 3 months. Please contact office for sooner follow up if symptoms do not improve or worsen or seek emergency care

## 2020-03-21 NOTE — Progress Notes (Signed)
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 He also has OSA on CPAP currently not managed by anyone.  Likely would not require any adjuvant therapy. He is followed by Dr. Shearon Stalls    03/21/2020 Post Lobectomy Follow up: Pt. Presents for follow up.He is post op 03/09/2020. His last cigarette was 03/08/2020. He has not had a cigarette since. He states he does have any desire to smoke. He is wearing CPAP. He is not having this monitored. Last sleep test was 2013. His CPAP is set at 12 cm H2O. Pt. States he is doing well. He states he did lose 5 pounds with surgery, but he has gained it back. He  has not had any shortness of breath with activity. He is not back to his baseline.He has not returned to work. He does have some right leg swelling.  He will let Dr. [name] know when he feels ready to go back to work. His L chest tube and robotic incisions are healing well. There is still some glue, no redness of drainage.No fever. He does have some occasional muscle pain in his left rib area. He states it is tolerable. He states he is getting choked on food. He states this happens mainly with dryer food items.  He does not want to do a swallow study. We will monitor.    Test Results: 01/13/2020 Low Dose CT Chest Lungs/Pleura: Centrilobular emphsyema noted. Marked interval progression of a cystic lesion in the posterior left lower lobe with prominent lobular/nodular peripheral contour dominant nodular component measures 8.3 mm (image 157/3). No focal airspace consolidation. No pleural effusion. IMPRESSION: 8 mm mixed attenuation lesion in the posterior left lower lobe has markedly progressed in the interval. Lung-RADS 4B, suspicious. Additional imaging evaluation or consultation with Pulmonology or Thoracic Surgery recommended.  PET Scan 02/02/2020 10 x 16 mm  hypermetabolic thick-walled cavitary lesion in the posterior left lower lobe, suspicious for primary bronchogenic neoplasm such as squamous cell carcinoma.  No findings suspicious for metastatic disease.   A. LUNG, LEFT LOWER LOBE, WEDGE RESECTION:  - Adenocarcinoma, moderately differentiated, spanning 1.5 cm.  - See part C for final margin status.  - Tumor is limited to lung.  - See oncology table.   B. LUNG, LEFT LOWER LOBE, LOBECTOMY:  - Focal adenocarcinoma.  - Bronchial and vascular margins are negative.  - Tumor is limited to lung.  - Two of two lymph nodes negative for carcinoma (0/2).  - See oncology table.   C. LYMPH NODE, LEVEL 9, BIOPSY:  - One of one lymph nodes negative for carcinoma (0/1).   D. LYMPH NODE, HILAR #1, BIOPSY:  - One of one lymph nodes negative for carcinoma (0/1).  - Benign lung tissue.   E. LYMPH NODE, HILAR #2, BIOPSY:  - One of one lymph nodes negative for carcinoma (0/1).   F. LYMPH NODE, LEVEL 5, BIOPSY:  - One of one lymph nodes negative for carcinoma (0/1).   G. LYMPH NODE, HILAR #3, BIOPSY:  - One of one lymph nodes negative for carcinoma (0/1).   H. LYMPH NODE, HILAR #4, BIOPSY:  - Benign lung and pleura.  - No lymphoid tissue.   I. LYMPH NODE, HILAR #5, BIOPSY:  - Benign lung tissue.  - No lymphoid tissue.     CBC Latest Ref Rng & Units 03/10/2020 03/07/2020 12/08/2019  WBC  4.0 - 10.5 K/uL 12.3(H) 7.2 8.3  Hemoglobin 13.0 - 17.0 g/dL 13.7 15.6 16.4  Hematocrit 39 - 52 % 42.1 46.8 48.3  Platelets 150 - 400 K/uL 127(L) 147(L) 180    BMP Latest Ref Rng & Units 03/10/2020 03/07/2020 12/08/2019  Glucose 70 - 99 mg/dL 142(H) 97 96  BUN 8 - 23 mg/dL 12 11 14   Creatinine 0.61 - 1.24 mg/dL 0.82 0.88 0.80  BUN/Creat Ratio 6 - 22 (calc) - - NOT APPLICABLE  Sodium 604 - 145 mmol/L 140 139 140  Potassium 3.5 - 5.1 mmol/L 4.2 3.7 4.4  Chloride 98 - 111 mmol/L 106 102 105  CO2 22 - 32 mmol/L 26 27 27   Calcium 8.9 - 10.3 mg/dL 8.7(L)  9.1 9.5    BNP No results found for: BNP  ProBNP No results found for: PROBNP  PFT    Component Value Date/Time   DLCOUNC 35.36 01/22/2020 1443    DG Chest 2 View  Result Date: 03/16/2020 CLINICAL DATA:  Cough since robotic assisted surgical procedure dated 03/09/2020. EXAM: CHEST - 2 VIEW COMPARISON:  03/10/2020 FINDINGS: The heart size is normal. Postoperative appearance of the left lung compatible with interval left lower lobe wedge resection and lobectomy. No pleural effusion, airspace consolidation identified. No pneumothorax identified. IMPRESSION: Status post left lower lobe wedge resection and left lower lobe lobectomy. No complications identified. Electronically Signed   By: Kerby Moors M.D.   On: 03/16/2020 10:52   DG Chest 2 View  Result Date: 03/10/2020 CLINICAL DATA:  Recent left-sided lobectomy EXAM: CHEST - 2 VIEW COMPARISON:  March 09, 2020 FINDINGS: Chest tube position on the left is stable. No pneumothorax. No edema or airspace opacity. The heart size and pulmonary vascularity are normal. No adenopathy. There is aortic atherosclerosis. No bone lesions. IMPRESSION: Chest tube on the left without pneumothorax. No edema or airspace opacity. Stable cardiac silhouette. Aortic Atherosclerosis (ICD10-I70.0). Electronically Signed   By: Lowella Grip III M.D.   On: 03/10/2020 09:29   DG Chest 2 View  Result Date: 03/08/2020 CLINICAL DATA:  Left lower lobe pulmonary nodule. Preoperative exam. EXAM: CHEST - 2 VIEW COMPARISON:  01/13/2020 FINDINGS: The heart size and mediastinal contours are within normal limits. Atherosclerotic calcification of the aortic knob. Known posterior left lower lobe nodule is not well seen radiographically. No focal airspace consolidation, pleural effusion, or pneumothorax. Multilevel degenerative changes within the thoracic spine. IMPRESSION: 1. No acute cardiopulmonary disease. 2. Known left lower lobe nodule is not well seen radiographically.  Electronically Signed   By: Davina Poke D.O.   On: 03/08/2020 09:24   DG CHEST PORT 1 VIEW  Result Date: 03/10/2020 CLINICAL DATA:  Resection left pulmonary nodular lesion EXAM: PORTABLE CHEST 1 VIEW COMPARISON:  March 10, 2020 study obtained earlier in the day; chest CT Jan 13, 2020 FINDINGS: Left chest tube has been removed. No pneumothorax. There is atelectatic change in the left base medially. There is mild atelectatic change in the right base as well. No consolidation. Heart size and pulmonary vascularity normal. No adenopathy. No bone lesions. IMPRESSION: Left chest tube removed without pneumothorax. Bibasilar atelectasis. No edema or airspace opacity. Cardiac silhouette stable. Electronically Signed   By: Lowella Grip III M.D.   On: 03/10/2020 13:00   DG Chest Port 1 View  Result Date: 03/09/2020 CLINICAL DATA:  Status post robot assisted thoracic surgery. EXAM: PORTABLE CHEST 1 VIEW COMPARISON:  March 07, 2020. FINDINGS: Stable cardiomediastinal silhouette. No pneumothorax or pleural effusion  is noted. Left-sided chest tube is noted. Mild bibasilar subsegmental atelectasis is noted. Bony thorax is unremarkable. IMPRESSION: Left-sided chest tube is noted without evidence of pneumothorax. Mild bibasilar subsegmental atelectasis is noted. Electronically Signed   By: Marijo Conception M.D.   On: 03/09/2020 13:23   Myocardial Perfusion Imaging  Result Date: 03/04/2020  Nuclear stress EF: 48%.  The left ventricular ejection fraction is mildly decreased (45-54%).  There was no ST segment deviation noted during stress.  Defect 1: There is a medium defect of mild severity present in the basal inferior, mid inferior and apical inferior location.  Defect 2: There is a small defect of mild severity present in the apex location.  Findings consistent with prior myocardial infarction.  This is an intermediate risk study.  1. Fixed perfusion defects at apex and inferior wall consistent with prior  infarct. 2. Intermediate risk study due to mild systolic dysfunction (EF 73%).  No ischemia.    Past medical hx Past Medical History:  Diagnosis Date  . Arthritis of knee    seeing Dr. Alfonso Ramus  . Cancer ([place])    Lung  . Coronary artery disease   . Depression with anxiety    hx of  . Exogenous obesity   . History of sleep apnea   . Hyperlipidemia   . Hypertension   . Panic attacks    hx of  . Sleep apnea    Uses a Cpap  . Smoker unmotivated to quit   . Varicose veins      Social History   Tobacco Use  . Smoking status: Former Smoker    Packs/day: 0.25    Years: 40.00    Pack years: 10.00    Types: Cigarettes    Start date: 41  . Smokeless tobacco: Never Used  . Tobacco comment: stopped 03/08/2020  Vaping Use  . Vaping Use: Never used  Substance Use Topics  . Alcohol use: Yes    Alcohol/week: 0.0 standard drinks    Comment: occasionally  . Drug use: No    Mr.Jackson reports that he has quit smoking. His smoking use included cigarettes. He started smoking about 48 years ago. He has a 10.00 pack-year smoking history. He has never used smokeless tobacco. He reports current alcohol use. He reports that he does not use drugs.  Tobacco Cessation: Last Cigarette was 7/13 prior to surgery. He has quit entirely.   Past surgical hx, Family hx, Social hx all reviewed.  Current Outpatient Medications on File Prior to Visit  Medication Sig  . carboxymethylcellul-glycerin (OPTIVE) 0.5-0.9 % ophthalmic solution Place 1 drop into both eyes daily as needed for dry eyes. refresh  . clopidogrel (PLAVIX) 75 MG tablet TAKE 1 TABLET BY MOUTH DAILY (Patient taking differently: Take 75 mg by mouth daily. )  . Evolocumab (REPATHA SURECLICK) 419 MG/ML SOAJ Inject 140 mg into the skin every 14 (fourteen) days.  . fluticasone (FLONASE) 50 MCG/ACT nasal spray Place 2 sprays into both nostrils daily. (Patient taking differently: Place 2 sprays into both nostrils daily as needed. )  .  hydrochlorothiazide (HYDRODIURIL) 25 MG tablet Take 1 tablet (25 mg total) by mouth daily as needed (edema).  . losartan (COZAAR) 100 MG tablet TAKE 1 TABLET BY MOUTH EVERY DAY (Patient taking differently: Take 100 mg by mouth at bedtime. )  . metoprolol succinate (TOPROL-XL) 25 MG 24 hr tablet TAKE 1/2 TABLET BY MOUTH EVERY DAY (Patient taking differently: Take 12.5 mg by mouth daily. )  .  nitroGLYCERIN (NITROSTAT) 0.4 MG SL tablet Place 1 tablet (0.4 mg total) under the tongue every 5 (five) minutes as needed for chest pain.  Marland Kitchen PARoxetine (PAXIL) 40 MG tablet TAKE 1 TABLET(40 MG) BY MOUTH DAILY (Patient taking differently: Take 40 mg by mouth at bedtime. )   No current facility-administered medications on file prior to visit.     No Known Allergies  Review Of Systems:  Constitutional:   No  weight loss, night sweats,  Fevers, chills, fatigue, or  lassitude.  HEENT:   No headaches,  Difficulty swallowing,  Tooth/dental problems, or  Sore throat,                No sneezing, itching, ear ache, nasal congestion, post nasal drip,   CV:  No chest pain,  Orthopnea, PND, swelling in lower extremities, anasarca, dizziness, palpitations, syncope.   GI  No heartburn, indigestion, abdominal pain, nausea, vomiting, diarrhea, change in bowel habits, loss of appetite, bloody stools.   Resp: No shortness of breath with exertion or at rest.  No excess mucus, no productive cough,  No non-productive cough,  No coughing up of blood.  No change in color of mucus.  No wheezing.  No chest wall deformity  Skin: no rash or lesions.  GU: no dysuria, change in color of urine, no urgency or frequency.  No flank pain, no hematuria   MS:  No joint pain or swelling.  No decreased range of motion.  No back pain.  Psych:  No change in mood or affect. No depression or anxiety.  No memory loss.   Vital Signs BP 120/80 (BP Location: Left Arm, Cuff Size: Normal)   Pulse 60   Temp (!) 97.2 F (36.2 C) (Oral)   Ht  6\' 1"  (1.854 m)   Wt (!) 305 lb 9.6 oz (138.6 kg)   SpO2 96%   BMI 40.32 kg/m    Physical Exam:  General- No distress,  A&Ox3, pleasant ENT: No sinus tenderness, TM clear, pale nasal mucosa, no oral exudate,no post nasal drip, no LAN Cardiac: S1, S2, regular rate and rhythm, no murmur Chest: No wheeze/ rales/ dullness; no accessory muscle use, no nasal flaring, no sternal retractions, slightly diminished BS per left  Abd.: Soft Non-tender, ND, BS +, Body mass index is 40.32 kg/m. 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  No metastasis per PET No need for additional therapy other than surveillance Doing really well Plan Continued surveillance  Follow up with Dr. [name] as is scheduled.  Please contact office for sooner follow up if symptoms do not improve or worsen or seek emergency care   OSA on CPAP Last sleep study 2013 Has had no monitoring of his OSA or CPAP use in years Plan We will place an order for a home sleep study. You will get a call from the office to schedule.  Continue on CPAP at bedtime. You appear to be benefiting from the treatment  Goal is to wear for at least 6 hours each night for maximal clinical benefit. Continue to work on weight loss, as the link between excess weight  and sleep apnea is well established.   Remember to establish a good bedtime routine, and work on  sleep hygiene.  Limit daytime naps , avoid stimulants such as caffeine and nicotine close to bedtime, exercise daily to promote sleep quality, avoid heavy , spicy, fried , or rich foods before bed. Ensure adequate exposure to natural light during the day,establish a relaxing bedtime routine with a pleasant sleep environment ( Bedroom  between 60 and 67 degrees, turn off bright lights , TV or device screens screens , consider black out curtains or white noise machines) Do not drive if sleepy. Remember to clean mask, tubing, filter, and reservoir once weekly with soapy water.   Follow up with Dr. [name] as is scheduled.  Follow up with Dr. Cecile Sheerer   Choking on occasion while eating Barium study with follow up by GI Plan Please eat slowly. Take plenty of sips of liquid while eating. Let us know if the choking on food does not get better and we will do a swallow study.  Follow up with Dr. Shearon Stalls in 3 months. Please contact office for sooner follow up if symptoms do not improve or worsen or seek emergency care    This appointment was 30 min long with over 50% of the time in direct face-to-face patient care, assessment, plan of care, and follow-up.   Magdalen Spatz, NP 03/21/2020  6:18 PM

## 2020-03-31 ENCOUNTER — Telehealth: Payer: Self-pay

## 2020-03-31 DIAGNOSIS — Z736 Limitation of activities due to disability: Secondary | ICD-10-CM

## 2020-03-31 NOTE — Telephone Encounter (Signed)
FMLA.STD forms completed. Patient will pick up forms at [employer] front desk today. Beginning leave 714/21 through 06/06/20 appt scheduled with Dr [name] on 04/22/20- will discuss returning to work earlier then.

## 2020-04-07 DIAGNOSIS — M170 Bilateral primary osteoarthritis of knee: Secondary | ICD-10-CM | POA: Diagnosis not present

## 2020-04-22 ENCOUNTER — Ambulatory Visit (INDEPENDENT_AMBULATORY_CARE_PROVIDER_SITE_OTHER): Payer: Self-pay | Admitting: Thoracic Surgery (Cardiothoracic Vascular Surgery)

## 2020-04-22 ENCOUNTER — Encounter: Payer: Self-pay | Admitting: Thoracic Surgery (Cardiothoracic Vascular Surgery)

## 2020-04-22 ENCOUNTER — Other Ambulatory Visit: Payer: Self-pay

## 2020-04-22 VITALS — BP 127/74 | HR 56 | Temp 97.6°F | Resp 20 | Ht 73.0 in | Wt 300.0 lb

## 2020-04-22 DIAGNOSIS — Z9889 Other specified postprocedural states: Secondary | ICD-10-CM

## 2020-04-22 DIAGNOSIS — R911 Solitary pulmonary nodule: Secondary | ICD-10-CM

## 2020-04-22 NOTE — Progress Notes (Signed)
° °   °  [address]       [place] [address]             [phone]        [name] [place] Medical Record #[id number] Date of Birth: 09-28-1955  Referring: Melvenia Needles, NP Primary Care: [name], MD Primary Cardiologist:Peter Johnsie Cancel, MD  Reason for visit:   follow-up  History of Present Illness:     [name] comes in for his second follow-up appointment. Overall he is doing well. He occasionally has some exertional dyspnea. He also occasionally has some pain at the access incision site.  Physical Exam: BP 127/74    Pulse (!) 56    Temp 97.6 F (36.4 C) (Skin)    Resp 20    Ht 6\' 1"  (1.854 m)    Wt 300 lb (136.1 kg)    SpO2 95%    BMI 39.58 kg/m   Alert NAD Incision clean.   Abdomen soft, ND No peripheral edema   Diagnostic Studies & Laboratory data: CXR: Clear     Assessment / Plan:   64 year old male with history of a T1b N0 M0 stage Ia adenocarcinoma of the left lower lobe. He is status post robotic assisted lobectomy currently doing well. He still has some exertional dyspnea which is improving, and he is set for another sleep study for evaluation of his obstructive sleep apnea. From a surgical standpoint he is doing well, but will require further evaluation from pulmonary prior to going back to work. We'll reassess his ability to return to work in 59 month.  He is set to return back with a chest x-ray in 6 months for continued surveillance of his lung cancer.  [name] 04/22/2020 3:38 PM

## 2020-05-02 ENCOUNTER — Other Ambulatory Visit: Payer: Self-pay | Admitting: Cardiovascular Disease

## 2020-05-04 ENCOUNTER — Other Ambulatory Visit: Payer: Self-pay

## 2020-05-04 ENCOUNTER — Ambulatory Visit: Payer: BC Managed Care – PPO

## 2020-05-04 DIAGNOSIS — Z8669 Personal history of other diseases of the nervous system and sense organs: Secondary | ICD-10-CM

## 2020-05-04 DIAGNOSIS — G4733 Obstructive sleep apnea (adult) (pediatric): Secondary | ICD-10-CM | POA: Diagnosis not present

## 2020-05-11 ENCOUNTER — Other Ambulatory Visit: Payer: Self-pay

## 2020-05-11 ENCOUNTER — Encounter: Payer: Self-pay | Admitting: Internal Medicine

## 2020-05-11 ENCOUNTER — Ambulatory Visit (INDEPENDENT_AMBULATORY_CARE_PROVIDER_SITE_OTHER): Payer: BC Managed Care – PPO | Admitting: Internal Medicine

## 2020-05-11 VITALS — BP 130/60 | HR 55 | Temp 97.8°F | Wt 303.1 lb

## 2020-05-11 DIAGNOSIS — G4733 Obstructive sleep apnea (adult) (pediatric): Secondary | ICD-10-CM | POA: Diagnosis not present

## 2020-05-11 DIAGNOSIS — Z9989 Dependence on other enabling machines and devices: Secondary | ICD-10-CM

## 2020-05-11 DIAGNOSIS — C3492 Malignant neoplasm of unspecified part of left bronchus or lung: Secondary | ICD-10-CM

## 2020-05-11 NOTE — Patient Instructions (Signed)
The patient should have follow up scheduled with myself in 3 months.   Our office will contact you once we receive your sleep apnea test results. We will send you a new machine.  Please follow up after you receive your CPAP machine so we can make sure it is working

## 2020-05-11 NOTE — Progress Notes (Signed)
[name]    [id number]    06/23/1956  Primary Care Physician:Pickard, Cammie Mcgee, MD Date of Appointment: 05/11/2020 Established Patient Visit  Chief complaint:   Chief Complaint  Patient presents with   Follow-up    home sleep study done 05/04/2020--due for a new cpap machine.  doing well with the cpap use.      HPI: [name] is a 64 y.o. gentleman with Stage 1A adenocarcinoma of the lung s/p resection with Dr. [name] in July 2021  Interval Updates: S/p lung resection in July 2021. Doing well post-operatively. Pain is gone and energy level improving in the last couple weeks. Going back to work in a couple weeks. Had his home sleep apnea test. Has CPAp Current settings on CPAP 12 cm H20. Uses nasal pillows. DME - [facility] in the past.   I have reviewed the patient's family social and past medical history and updated as appropriate.   Past Medical History:  Diagnosis Date   Arthritis of knee    seeing Dr. Alfonso Ramus   Cancer [facility])    Lung   Coronary artery disease    Depression with anxiety    hx of   Exogenous obesity    History of sleep apnea    Hyperlipidemia    Hypertension    Panic attacks    hx of   Sleep apnea    Uses a Cpap   Smoker unmotivated to quit    Varicose veins     Past Surgical History:  Procedure Laterality Date   CARDIAC CATHETERIZATION  09/06/2008   stent to mid LAD and proximal LAD   COLONOSCOPY N/A 05/01/2013   Procedure: COLONOSCOPY;  Surgeon: [name], MD;  Location: [facility];  Service: Endoscopy;  Laterality: N/A;  8:30 AM   EYE SURGERY  2006   Lasik on both eyes   INTERCOSTAL NERVE BLOCK Left 03/09/2020   Procedure: INTERCOSTAL NERVE BLOCK;  Surgeon: [name], MD;  Location: [facility];  Service: Thoracic;  Laterality: Left;   LOBECTOMY  03/09/2020   XI ROBOTIC ASSISTED THORASCOPY - LOWER LEFT LOBE (LLL) WEDGE RESECTION AND LLL LOBECTOMY (Left)   LYMPH NODE DISSECTION  N/A 03/09/2020   Procedure: LYMPH NODE DISSECTION;  Surgeon: [name], MD;  Location: [facility];  Service: Thoracic;  Laterality: N/A;   polyps of lung  2008   removed by [facility]   TONSILLECTOMY     VIDEO BRONCHOSCOPY N/A 03/09/2020   Procedure: VIDEO BRONCHOSCOPY;  Surgeon: [name], MD;  Location: [facility];  Service: Thoracic;  Laterality: N/A;    Family History  Problem Relation Age of Onset   Heart disease Father        had cabg   Esophageal cancer Father 39       esophageal   Lung cancer Brother        lung cancer   Colon cancer Neg Hx     Social History   Occupational History   Occupation: [employer]  Tobacco Use   Smoking status: Former Smoker    Packs/day: 0.25    Years: 40.00    Pack years: 10.00    Types: Cigarettes    Start date: 1973   Smokeless tobacco: Never Used   Tobacco comment: stopped 03/08/2020  Vaping Use   Vaping Use: Never used  Substance and Sexual Activity   Alcohol use: Yes    Alcohol/week:  0.0 standard drinks    Comment: occasionally   Drug use: No   Sexual activity: Not on file     Physical Exam: Blood pressure 130/60, pulse (!) 55, temperature 97.8 F (36.6 C), temperature source Oral, weight (!) 303 lb 2 oz (137.5 kg), SpO2 97 %.  Gen:      No acute distress, bearded. Lungs:    No increased respiratory effort, symmetric chest wall excursion, clear to auscultation bilaterally, no wheezes or crackles CV:         Regular rate and rhythm; no murmurs, rubs, or gallops.  No pedal edema   Data Reviewed: Imaging: I have personally reviewed the chest xray July 2021 postoperative. Stable after lobectomy. No acute changes.  PFTs:  PFT Results Latest Ref Rng & Units 01/22/2020  DLCO uncorrected ml/min/mmHg 35.36  DLCO UNC% % 123  DLCO corrected ml/min/mmHg 33.76  DLCO COR %Predicted % 117  DLVA Predicted % 113   I have personally reviewed the patient's PFTs and normal diffusion  capacity.  Labs: Lab Results  Component Value Date   WBC 12.3 (H) 03/10/2020   HGB 13.7 03/10/2020   HCT 42.1 03/10/2020   MCV 98.6 03/10/2020   PLT 127 (L) 03/10/2020     Immunization status: Immunization History  Administered Date(s) Administered   Pneumococcal Polysaccharide-23 12/21/2015   Tdap 02/25/2013    Assessment:  OSA on CPAP Stage 1A lung cancer s/p left lower lobectomy  Plan/Recommendations: Will prescribe new CPAP machine pending results. Doing well without inhalers at this time. Will follow up once he receives CPAP machine. Would like to use [facility] for DME    Return to Care: Return in about 3 months (around 08/10/2020).   Lenice Llamas, MD Pulmonary and [place]

## 2020-05-12 ENCOUNTER — Other Ambulatory Visit: Payer: Self-pay | Admitting: Internal Medicine

## 2020-05-12 ENCOUNTER — Telehealth: Payer: Self-pay | Admitting: Internal Medicine

## 2020-05-12 DIAGNOSIS — G4733 Obstructive sleep apnea (adult) (pediatric): Secondary | ICD-10-CM | POA: Diagnosis not present

## 2020-05-12 DIAGNOSIS — Z9989 Dependence on other enabling machines and devices: Secondary | ICD-10-CM

## 2020-05-12 NOTE — Progress Notes (Signed)
Home sleep study from 05/04/20 showed severe obstructive sleep apnea with an AHI of 43.1 and SpO2 low of 81%. Patient needs new Rx for CPAP. Would like to use [facility] [employer]

## 2020-05-12 NOTE — Telephone Encounter (Signed)
Called and spoke with patient, confirmed that patient wants to use [facility] for his CPAP supplies.  Advised patient the DME company will call him to get the CPAP machine to him and show him how to use it.  He verbalized understanding.  Orders sent.  Nothing further needed.

## 2020-05-17 NOTE — Progress Notes (Signed)
Called on 9/16 and spoke with patient, confirmed that patient wants to use [facility] for his CPAP supplies.  Advised patient the DME company will call him to get the CPAP machine to him and show him how to use it.  He verbalized understanding.  Orders sent.  Nothing further needed.

## 2020-05-19 ENCOUNTER — Ambulatory Visit: Payer: BC Managed Care – PPO | Admitting: Internal Medicine

## 2020-05-25 DIAGNOSIS — G4733 Obstructive sleep apnea (adult) (pediatric): Secondary | ICD-10-CM | POA: Diagnosis not present

## 2020-06-09 ENCOUNTER — Other Ambulatory Visit: Payer: Self-pay | Admitting: Cardiovascular Disease

## 2020-06-13 ENCOUNTER — Telehealth: Payer: Self-pay | Admitting: Cardiovascular Disease

## 2020-06-13 NOTE — Telephone Encounter (Signed)
[facility] is calling to speak with someone in regards to the prior authorization on   REPATHA SURECLICK 972 MG/ML SOAJ   Rx number is [phone]

## 2020-06-13 NOTE — Telephone Encounter (Signed)
Pa submitted

## 2020-06-16 MED ORDER — REPATHA SURECLICK 140 MG/ML ~~LOC~~ SOAJ: SUBCUTANEOUS | 11 refills | Status: DC

## 2020-06-16 NOTE — Addendum Note (Signed)
Addended by: Quention Mcneill E on: 06/16/2020 09:37 AM   Modules accepted: Orders

## 2020-06-16 NOTE — Telephone Encounter (Signed)
Prior auth approved through 06/12/21, new refill sent into pharmacy.

## 2020-06-20 NOTE — Telephone Encounter (Signed)
Dr. Shearon Stalls, Please see message from mychart from patient.  Please advise if patient still needs a 3 month f/u appointment.  Thank you.

## 2020-07-11 DIAGNOSIS — M170 Bilateral primary osteoarthritis of knee: Secondary | ICD-10-CM | POA: Diagnosis not present

## 2020-08-01 ENCOUNTER — Telehealth: Payer: Self-pay | Admitting: Pharmacist

## 2020-08-01 NOTE — Telephone Encounter (Signed)
Pt called clinic to state [employer] copay went up to $35, had previously been $5. Called pharmacy, on they thought copay card had expired. Activated new copay card, provided this info to pharmacy, still rejected. Then called [employer] who stated that initial card needed to be reactivated. They would not let me activate the card without the patient on the phone, pt did not pick up when they called him. Spent over 1 hour on hold with [employer] and pharmacy and was unable to assist patient because [employer] Ready mandated that the pt be on the line.  Called pt after who stated he didn't recognize the number so he didn't pick up. He is aware he will need to call [employer] Ready to reactivate his card and then provide ID # to the pharmacy. He is aware that the hold time is currently > 45 minutes.

## 2020-08-12 DIAGNOSIS — U071 COVID-19: Secondary | ICD-10-CM | POA: Diagnosis not present

## 2020-08-12 DIAGNOSIS — J209 Acute bronchitis, unspecified: Secondary | ICD-10-CM | POA: Diagnosis not present

## 2020-08-15 ENCOUNTER — Telehealth: Payer: Self-pay

## 2020-08-15 NOTE — Telephone Encounter (Signed)
Thanks for the referral - our symptom cutoff is 7 days due to high demand and limited supply - please refer the patient to the [facility] if symptoms persist - [phone]    Will give Mr & Mrs Gritton a call in reference to this message from [facility].

## 2020-08-16 ENCOUNTER — Encounter (HOSPITAL_COMMUNITY): Payer: Self-pay | Admitting: *Deleted

## 2020-08-16 ENCOUNTER — Emergency Department (HOSPITAL_COMMUNITY): Payer: BC Managed Care – PPO

## 2020-08-16 ENCOUNTER — Other Ambulatory Visit: Payer: Self-pay

## 2020-08-16 ENCOUNTER — Ambulatory Visit (INDEPENDENT_AMBULATORY_CARE_PROVIDER_SITE_OTHER): Payer: BC Managed Care – PPO | Admitting: Nurse Practitioner

## 2020-08-16 ENCOUNTER — Emergency Department (HOSPITAL_COMMUNITY)
Admission: EM | Admit: 2020-08-16 | Discharge: 2020-08-16 | Disposition: A | Discharge status: Home / Self Care | Payer: BC Managed Care – PPO | Attending: Emergency Medicine | Admitting: Emergency Medicine

## 2020-08-16 DIAGNOSIS — I2510 Atherosclerotic heart disease of native coronary artery without angina pectoris: Secondary | ICD-10-CM | POA: Diagnosis not present

## 2020-08-16 DIAGNOSIS — Z87891 Personal history of nicotine dependence: Secondary | ICD-10-CM | POA: Insufficient documentation

## 2020-08-16 DIAGNOSIS — Z85118 Personal history of other malignant neoplasm of bronchus and lung: Secondary | ICD-10-CM | POA: Diagnosis not present

## 2020-08-16 DIAGNOSIS — Z79899 Other long term (current) drug therapy: Secondary | ICD-10-CM | POA: Diagnosis not present

## 2020-08-16 DIAGNOSIS — J189 Pneumonia, unspecified organism: Secondary | ICD-10-CM | POA: Diagnosis not present

## 2020-08-16 DIAGNOSIS — U071 COVID-19: Secondary | ICD-10-CM | POA: Diagnosis not present

## 2020-08-16 DIAGNOSIS — J1282 Pneumonia due to coronavirus disease 2019: Secondary | ICD-10-CM | POA: Insufficient documentation

## 2020-08-16 DIAGNOSIS — I10 Essential (primary) hypertension: Secondary | ICD-10-CM | POA: Insufficient documentation

## 2020-08-16 DIAGNOSIS — R059 Cough, unspecified: Secondary | ICD-10-CM | POA: Diagnosis not present

## 2020-08-16 LAB — CBC WITH DIFFERENTIAL/PLATELET
Abs Immature Granulocytes: 0.03 10*3/uL (ref 0.00–0.07)
Basophils Absolute: 0 10*3/uL (ref 0.0–0.1)
Basophils Relative: 0 %
Eosinophils Absolute: 0.1 10*3/uL (ref 0.0–0.5)
Eosinophils Relative: 1 %
HCT: 44 % (ref 39.0–52.0)
Hemoglobin: 14.2 g/dL (ref 13.0–17.0)
Immature Granulocytes: 1 %
Lymphocytes Relative: 27 %
Lymphs Abs: 1.5 10*3/uL (ref 0.7–4.0)
MCH: 31.6 pg (ref 26.0–34.0)
MCHC: 32.3 g/dL (ref 30.0–36.0)
MCV: 98 fL (ref 80.0–100.0)
Monocytes Absolute: 0.4 10*3/uL (ref 0.1–1.0)
Monocytes Relative: 8 %
Neutro Abs: 3.5 10*3/uL (ref 1.7–7.7)
Neutrophils Relative %: 63 %
Platelets: 129 10*3/uL — ABNORMAL LOW (ref 150–400)
RBC: 4.49 MIL/uL (ref 4.22–5.81)
RDW: 12.4 % (ref 11.5–15.5)
WBC: 5.4 10*3/uL (ref 4.0–10.5)
nRBC: 0 % (ref 0.0–0.2)

## 2020-08-16 LAB — COMPREHENSIVE METABOLIC PANEL
ALT: 30 U/L (ref 0–44)
AST: 20 U/L (ref 15–41)
Albumin: 3.8 g/dL (ref 3.5–5.0)
Alkaline Phosphatase: 61 U/L (ref 38–126)
Anion gap: 9 (ref 5–15)
BUN: 18 mg/dL (ref 8–23)
CO2: 27 mmol/L (ref 22–32)
Calcium: 9.1 mg/dL (ref 8.9–10.3)
Chloride: 102 mmol/L (ref 98–111)
Creatinine, Ser: 0.64 mg/dL (ref 0.61–1.24)
GFR, Estimated: >60 mL/min (ref >60)
Glucose, Bld: 88 mg/dL (ref 70–99)
Potassium: 4.2 mmol/L (ref 3.5–5.1)
Sodium: 138 mmol/L (ref 135–145)
Total Bilirubin: 0.4 mg/dL (ref 0.3–1.2)
Total Protein: 6.9 g/dL (ref 6.5–8.1)

## 2020-08-16 MED ORDER — SODIUM CHLORIDE 0.9 % IV SOLN: INTRAVENOUS | Status: DC | PRN

## 2020-08-16 MED ORDER — SODIUM CHLORIDE 0.9 % IV SOLN
1200.0000 mg | Freq: Once | INTRAVENOUS | Status: AC
  Administered 2020-08-16: 1200 mg via INTRAVENOUS
  Filled 2020-08-16: qty 10

## 2020-08-16 MED ORDER — FAMOTIDINE IN NACL 20-0.9 MG/50ML-% IV SOLN: 20.0000 mg | Freq: Once | INTRAVENOUS | Status: DC | PRN

## 2020-08-16 MED ORDER — METHYLPREDNISOLONE SODIUM SUCC 125 MG IJ SOLR: 125.0000 mg | Freq: Once | INTRAMUSCULAR | Status: DC | PRN

## 2020-08-16 MED ORDER — EPINEPHRINE 0.3 MG/0.3ML IJ SOAJ: 0.3000 mg | Freq: Once | INTRAMUSCULAR | Status: DC | PRN

## 2020-08-16 MED ORDER — DIPHENHYDRAMINE HCL 50 MG/ML IJ SOLN: 50.0000 mg | Freq: Once | INTRAMUSCULAR | Status: DC | PRN

## 2020-08-16 MED ORDER — ALBUTEROL SULFATE HFA 108 (90 BASE) MCG/ACT IN AERS: 2.0000 | INHALATION_SPRAY | Freq: Once | RESPIRATORY_TRACT | Status: DC | PRN

## 2020-08-16 NOTE — Discharge Instructions (Addendum)
Your work-up today was reassuring, however your chest x-ray does show concerns for Covid pneumonia.  You did receive the Mab infusion today.  I want you to continue following up with your PCP.  If you have any new worsening concerning symptoms such as shortness of breath or trouble breathing please come back to the emergency department. Continue to take your medications as told by your PCP.

## 2020-08-16 NOTE — ED Triage Notes (Signed)
States he was diagnosed with covid on 08/06/20. States she had surgery for lung cancer 6 months ago and is concerned he may have covid pneumonia. spouse was admitted yesterday with covid pneumonia. Denies fever.

## 2020-08-16 NOTE — Patient Instructions (Signed)
Covid 19 Cough:   Stay well hydrated  Stay active  Deep breathing exercises  May take tylenol for fever or pain  May take Delsym twice daily  Please start prednisone that was prescribed by PCP     Follow up:  Follow up if needed

## 2020-08-16 NOTE — ED Notes (Signed)
O2 94% when ambulating.

## 2020-08-16 NOTE — ED Provider Notes (Addendum)
[facility] EMERGENCY DEPARTMENT Provider Note   CSN: [id number] Arrival date & time: 08/16/20  1324     History Chief Complaint  Patient presents with  . Covid Positive    [name] is a 64 y.o. male with past medical history of lung cancer status post lobectomy, CAD, hypertension, hyperlipidemia that presents emergency department today for concerns for Covid pneumonia.  Patient states that he was diagnosed with Covid on 12/11, was supposed to get Mab infusion today, however he was concerned he had Covid pneumonia today therefore he did not go to get his Mab infusion.  States that he feels generally healthy, however is concerned because his wife had to be admitted for Covid pneumonia yesterday.  Patient states that he has some congestion in his throat and some myalgias, however is not short of breath.  Denies any chest pain.  Denies any trouble breathing while walking.  Patient states that he is concerned about Covid pneumonia due to his prior history of lung cancer.  He is not currently being treated with chemo for lung cancer, states that he was able to get partial lobectomy, as was 6 months ago.  States that he has had symptoms for 11 days therefore was concerned about lingering Covid pneumonia.  Denies any nausea, vomiting, abdominal pain, diarrhea, headache, vision changes, neck pain.  States that he is been eating and drinking normally.  Patient states that he has been feeling better over the past 11 days.  Has not been vaccinated.  Patient has been seeking care over telehealth due to his Covid, did see post Covid care this morning.  No other complaints.  HPI     Past Medical History:  Diagnosis Date  . Arthritis of knee    seeing Dr. Alfonso Ramus  . Cancer ([place])    Lung  . Coronary artery disease   . Depression with anxiety    hx of  . Exogenous obesity   . History of sleep apnea   . Hyperlipidemia   . Hypertension   . Panic attacks    hx of  . Sleep apnea    Uses a Cpap   . Smoker unmotivated to quit   . Varicose veins     Patient Active Problem List   Diagnosis Date Noted  . COVID-19 08/16/2020  . S/P robot-assisted surgical procedure 03/09/2020  . Vitamin D deficiency 12/27/2015  . Essential hypertension 12/21/2015  . Varicose veins of left lower extremity with complications 76/72/0947  . Varicose veins of right lower extremity with complications 09/62/8366  . Varicose veins of bilateral lower extremities with other complications 29/47/6546  . Varicose veins of leg with complications 50/35/4656  . Varicose veins of both lower extremities 03/03/2015  . Smoker unmotivated to quit   . Malaise and fatigue 01/03/2012  . Vertigo 01/03/2012  . Ischemic heart disease 06/25/2011  . Hypercholesterolemia 06/25/2011  . Panic attacks   . Arthritis of knee   . Exogenous obesity   . History of sleep apnea   . Depression with anxiety     Past Surgical History:  Procedure Laterality Date  . CARDIAC CATHETERIZATION  09/06/2008   stent to mid LAD and proximal LAD  . COLONOSCOPY N/A 05/01/2013   Procedure: COLONOSCOPY;  Surgeon: [name], MD;  [facility];  Service: Endoscopy;  Laterality: N/A;  8:30 AM  . EYE SURGERY  2006   Lasik on both eyes  . INTERCOSTAL NERVE BLOCK Left 03/09/2020   Procedure: INTERCOSTAL  NERVE BLOCK;  Surgeon: [name], MD;  Location: [place];  Service: Thoracic;  Laterality: Left;  . LOBECTOMY  03/09/2020   XI ROBOTIC ASSISTED THORASCOPY - LOWER LEFT LOBE (LLL) WEDGE RESECTION AND LLL LOBECTOMY (Left)  . LYMPH NODE DISSECTION N/A 03/09/2020   Procedure: LYMPH NODE DISSECTION;  Surgeon: [name], MD;  Location: [place] [facility];  Service: Thoracic;  Laterality: N/A;  . polyps of lung  2008   removed by [facility]  . TONSILLECTOMY    . VIDEO BRONCHOSCOPY N/A 03/09/2020   Procedure: VIDEO BRONCHOSCOPY;  Surgeon: [name], MD;  Location: [facility];  Service: Thoracic;  Laterality: N/A;        Family History  Problem Relation Age of Onset  . Heart disease Father        had cabg  . Esophageal cancer Father 17       esophageal  . Lung cancer Brother        lung cancer  . Colon cancer Neg Hx     Social History   Tobacco Use  . Smoking status: Former Smoker    Packs/day: 0.25    Years: 40.00    Pack years: 10.00    Types: Cigarettes    Start date: 38  . Smokeless tobacco: Never Used  . Tobacco comment: stopped 03/08/2020  Vaping Use  . Vaping Use: Never used  Substance Use Topics  . Alcohol use: Yes    Alcohol/week: 0.0 standard drinks    Comment: occasionally  . Drug use: No    Home Medications Prior to Admission medications   Medication Sig Start Date End Date Taking? Authorizing Provider  carboxymethylcellul-glycerin (OPTIVE) 0.5-0.9 % ophthalmic solution Place 1 drop into both eyes daily as needed for dry eyes. refresh    [provider]  clopidogrel (PLAVIX) 75 MG tablet TAKE 1 TABLET BY MOUTH DAILY Patient taking differently: Take 75 mg by mouth daily.  01/12/20   Josue Hector, MD  Evolocumab (REPATHA SURECLICK) 588 MG/ML SOAJ ADMINISTER 1 ML UNDER THE SKIN EVERY 14 DAYS 06/16/20   Josue Hector, MD  fluticasone [facility]) 50 MCG/ACT nasal spray Place 2 sprays into both nostrils daily. Patient taking differently: Place 2 sprays into both nostrils daily as needed.  11/02/19   [name], MD  hydrochlorothiazide (HYDRODIURIL) 25 MG tablet Take 1 tablet (25 mg total) by mouth daily as needed (edema). 06/23/18   Josue Hector, MD  losartan (COZAAR) 100 MG tablet TAKE 1 TABLET BY MOUTH EVERY DAY 05/04/20   Josue Hector, MD  metoprolol succinate (TOPROL-XL) 25 MG 24 hr tablet TAKE 1/2 TABLET BY MOUTH EVERY DAY 05/04/20   Josue Hector, MD  nitroGLYCERIN (NITROSTAT) 0.4 MG SL tablet Place 1 tablet (0.4 mg total) under the tongue every 5 (five) minutes as needed for chest pain. 12/25/17   Josue Hector, MD  PARoxetine (PAXIL) 40 MG  tablet TAKE 1 TABLET(40 MG) BY MOUTH DAILY Patient taking differently: Take 40 mg by mouth at bedtime.  01/12/20   [name], MD    Allergies    Patient has no known allergies.  Review of Systems   Review of Systems  Constitutional: Negative for chills, diaphoresis, fatigue and fever.  HENT: Positive for congestion. Negative for ear discharge, ear pain, facial swelling, hearing loss, mouth sores, rhinorrhea, sinus pressure, sinus pain, sneezing, sore throat and trouble swallowing.   Eyes: Negative for pain and visual disturbance.  Respiratory: Negative  for cough, shortness of breath and wheezing.   Cardiovascular: Negative for chest pain, palpitations and leg swelling.  Gastrointestinal: Negative for abdominal distention, abdominal pain, diarrhea, nausea and vomiting.  Genitourinary: Negative for difficulty urinating.  Musculoskeletal: Positive for myalgias. Negative for back pain, neck pain and neck stiffness.  Skin: Negative for pallor.  Neurological: Negative for dizziness, speech difficulty, weakness and headaches.  Psychiatric/Behavioral: Negative for confusion.    Physical Exam Updated Vital Signs BP 140/70   Pulse (!) 58   Temp 98.2 F (36.8 C) (Oral)   Resp 16   Ht 6\' 1"  (1.854 m)   Wt 136.1 kg   SpO2 95%   BMI 39.58 kg/m   Physical Exam Constitutional:      General: He is not in acute distress.    Appearance: Normal appearance. He is not ill-appearing, toxic-appearing or diaphoretic.  HENT:     Mouth/Throat:     Mouth: Mucous membranes are moist.     Pharynx: Oropharynx is clear.  Eyes:     General: No scleral icterus.    Extraocular Movements: Extraocular movements intact.     Pupils: Pupils are equal, round, and reactive to light.  Cardiovascular:     Rate and Rhythm: Normal rate and regular rhythm.     Pulses: Normal pulses.     Heart sounds: Normal heart sounds.  Pulmonary:     Effort: Pulmonary effort is normal. No respiratory distress.      Breath sounds: Normal breath sounds. No stridor. No wheezing, rhonchi or rales.  Chest:     Chest wall: No tenderness.  Abdominal:     General: Abdomen is flat. There is no distension.     Palpations: Abdomen is soft.     Tenderness: There is no abdominal tenderness. There is no guarding or rebound.  Musculoskeletal:        General: No swelling or tenderness. Normal range of motion.     Cervical back: Normal range of motion and neck supple. No rigidity.     Right lower leg: No edema.     Left lower leg: No edema.  Skin:    General: Skin is warm and dry.     Capillary Refill: Capillary refill takes less than 2 seconds.     Coloration: Skin is not pale.  Neurological:     General: No focal deficit present.     Mental Status: He is alert and oriented to person, place, and time.  Psychiatric:        Mood and Affect: Mood normal.        Behavior: Behavior normal.     ED Results / Procedures / Treatments   Labs (all labs ordered are listed, but only abnormal results are displayed) Labs Reviewed  CBC WITH DIFFERENTIAL/PLATELET - Abnormal; Notable for the following components:      Result Value   Platelets 129 (*)    All other components within normal limits  COMPREHENSIVE METABOLIC PANEL    EKG None  Radiology DG Chest Port 1 View  Result Date: 08/16/2020 CLINICAL DATA:  Cough.  Recent diagnosis of COVID EXAM: PORTABLE CHEST 1 VIEW COMPARISON:  Chest x-ray dated March 16, 2020 FINDINGS: The heart size is stable. Aortic calcifications are noted. There are few hazy airspace opacities bilaterally. No large pleural effusion. No pneumothorax. IMPRESSION: Subtle hazy airspace opacities bilaterally may represent multifocal pneumonia (viral or bacterial). Electronically Signed   By: Constance Holster M.D.   On: 08/16/2020 15:21  Procedures Procedures (including critical care time)  Medications Ordered in ED Medications  casirivimab-imdevimab (REGEN-COV) 1,200 mg in sodium  chloride 0.9 % 110 mL IVPB (has no administration in time range)  0.9 %  sodium chloride infusion (has no administration in time range)  diphenhydrAMINE (BENADRYL) injection 50 mg (has no administration in time range)  famotidine (PEPCID) IVPB 20 mg premix (has no administration in time range)  methylPREDNISolone sodium succinate (SOLU-MEDROL) 125 mg/2 mL injection 125 mg (has no administration in time range)  albuterol (VENTOLIN HFA) 108 (90 Base) MCG/ACT inhaler 2 puff (has no administration in time range)  EPINEPHrine (EPI-PEN) injection 0.3 mg (has no administration in time range)    ED Course  I have reviewed the triage vital signs and the nursing notes.  Pertinent labs & imaging results that were available during my care of the patient were reviewed by me and considered in my medical decision making (see chart for details).    MDM Rules/Calculators/A&P                         NASER SCHULD is a 64 y.o. male with past medical history of lung cancer status post lobectomy, CAD, hypertension, hyperlipidemia that presents emergency department today for concerns for Covid pneumonia.  No shortness of breath, patient is not any respiratory distress, appears very well.  O2 sats 98%, no tachypnea or tachycardia.    Patient with oxygen saturation above 94% upon ambulation.  Patient states that he feels well, no shortness of breath.  No chest pain.  Patient was supposed to receive Mab infusion today, CBC and CMP unremarkable.  Chest x-ray does show Covid pneumonia.  I think patient would benefit from MAB infusion in the emergency room at this time, specifically since he was supposed to get it this morning.  Patient does have high risk factors including obesity and heart disease.  Did discuss this with patient, patient is agreeable for this at this time.  Patient received Mab infusion, no immediate complications. Observed for an hour, on reassesment pt states that he feels much better. Patient to be  discharged at this time and follow-up with PCP.  Doubt need for further emergent work up at this time. I explained the diagnosis and have given explicit precautions to return to the ER including for any other new or worsening symptoms. The patient understands and accepts the medical plan as it's been dictated and I have answered their questions. Discharge instructions concerning home care and prescriptions have been given. The patient is STABLE and is discharged to home in good condition.  I discussed this case with my attending physician who cosigned this note including patient's presenting symptoms, physical exam, and planned diagnostics and interventions. Attending physician stated agreement with plan or made changes to plan which were implemented.    Final Clinical Impression(s) / ED Diagnoses Final diagnoses:  Pneumonia due to COVID-19 virus    Rx / DC Orders ED Discharge Orders    None          Alfredia Client, PA-C 08/16/20 1853    Isla Pence, MD 08/16/20 1907

## 2020-08-16 NOTE — Progress Notes (Signed)
Virtual Visit via Telephone Note  I connected with [name] on 08/16/20 at  8:30 AM EST by telephone and verified that I am speaking with the correct person using two identifiers.  Location: Patient: home Provider: office   I discussed the limitations, risks, security and privacy concerns of performing an evaluation and management service by telephone and the availability of in person appointments. I also discussed with the patient that there may be a patient responsible charge related to this service. The patient expressed understanding and agreed to proceed.   History of Present Illness:  Patient presents today for televisit for post COVID care.  He states that his symptoms started on 08/05/2020 and was diagnosed with Covid on 08/06/2020.  He states that he is improving.  He still is having fatigue and headache.  He denies any significant shortness of breath.  He states that he does have a slight cough that is improving.  He states that he is eating and drinking well.  He states that his primary care did call in a prescription for prednisone but he has not picked it up at the pharmacy.  He states that he will do this today.  Unfortunately patient's wife was admitted to the hospital last night with Covid pneumonia. Denies f/c/s, n/v/d, hemoptysis, PND, chest pain or edema.       Observations/Objective:   Vitals with BMI 05/11/2020 04/22/2020 03/21/2020  Height - 6\' 1"  6\' 1"   Weight 303 lbs 2 oz 300 lbs 305 lbs 10 oz  BMI 40 44.97 53.00  Systolic 511 021 117  Diastolic 60 74 80  Pulse 55 56 60      Assessment and Plan:  Covid 19 Cough:   Stay well hydrated  Stay active  Deep breathing exercises  May take tylenol for fever or pain  May take Delsym twice daily  Please start prednisone that was prescribed by PCP    Follow Up Instructions:  Follow up:  Follow up if needed     I discussed the assessment and treatment plan with the patient. The patient was  provided an opportunity to ask questions and all were answered. The patient agreed with the plan and demonstrated an understanding of the instructions.   The patient was advised to call back or seek an in-person evaluation if the symptoms worsen or if the condition fails to improve as anticipated.  I provided 22 minutes of non-face-to-face time during this encounter.   Fenton Foy, NP

## 2020-09-06 ENCOUNTER — Other Ambulatory Visit: Payer: Self-pay

## 2020-09-06 ENCOUNTER — Telehealth (INDEPENDENT_AMBULATORY_CARE_PROVIDER_SITE_OTHER): Payer: BC Managed Care – PPO | Admitting: Family Medicine

## 2020-09-06 DIAGNOSIS — U071 COVID-19: Secondary | ICD-10-CM

## 2020-09-06 DIAGNOSIS — I10 Essential (primary) hypertension: Secondary | ICD-10-CM | POA: Diagnosis not present

## 2020-09-06 DIAGNOSIS — E7800 Pure hypercholesterolemia, unspecified: Secondary | ICD-10-CM

## 2020-09-06 DIAGNOSIS — I259 Chronic ischemic heart disease, unspecified: Secondary | ICD-10-CM | POA: Diagnosis not present

## 2020-09-06 NOTE — Progress Notes (Signed)
Subjective:    Patient ID: [name], male    DOB: September 06, 1955, 65 y.o.   MRN: [id number]  HPI Patient is a very pleasant 65 year old Caucasian male who is being seen today as a telephone visit.  He consents to be seen via telephone.  Patient is currently at home.  I am currently in my office.  Phone call began at 10:00.  Phone call concluded at 1016.  Past medical history significant for coronary artery disease/ischemic heart disease.  He also has a history of lung cancer status post left lower lobe lobectomy.  However the patient believes that he was clinically cured by the lobectomy.  He did not require any chemotherapy postoperatively and there has been no evidence of metastatic spread.  Unfortunately he tested positive for COVID on December 11.  He ultimately had to go to the emergency room on December 21.  In the emergency room, his pulse oximetry at rest was 98% and with ambulation was 94%.  Chest x-ray did show multifocal pneumonia consistent with viral pneumonia due to COVID.  He received monoclonal antibody infusion in the emergency room.  His CBC and CMP in the emergency room were normal.  He was discharged home.  He is here today for follow-up.  He denies any chest pain.  He denies any shortness of breath.  He denies any dyspnea on exertion.  He states that he is now sleeping better.  He denies any lingering side effects.  He denies any leg swelling or pleurisy or symptoms of a DVT.  He denies any other longstanding complications aside from some fatigue. Past Medical History:  Diagnosis Date  . Arthritis of knee    seeing Dr. Alfonso Ramus  . Cancer ([place])    Lung  . Coronary artery disease   . Depression with anxiety    hx of  . Exogenous obesity   . History of sleep apnea   . Hyperlipidemia   . Hypertension   . Panic attacks    hx of  . Sleep apnea    Uses a Cpap  . Smoker unmotivated to quit   . Varicose veins    Past Surgical History:  Procedure Laterality Date  . CARDIAC  CATHETERIZATION  09/06/2008   stent to mid LAD and proximal LAD  . COLONOSCOPY N/A 05/01/2013   Procedure: COLONOSCOPY;  Surgeon: [name], MD;  [facility];  Service: Endoscopy;  Laterality: N/A;  8:30 AM  . EYE SURGERY  2006   Lasik on both eyes  . INTERCOSTAL NERVE BLOCK Left 03/09/2020   Procedure: INTERCOSTAL NERVE BLOCK;  Surgeon: [name], MD;  Location: [place];  Service: Thoracic;  Laterality: Left;  . LOBECTOMY  03/09/2020   XI ROBOTIC ASSISTED THORASCOPY - LOWER LEFT LOBE (LLL) WEDGE RESECTION AND LLL LOBECTOMY (Left)  . LYMPH NODE DISSECTION N/A 03/09/2020   Procedure: LYMPH NODE DISSECTION;  Surgeon: [name], MD;  Location: [facility];  Service: Thoracic;  Laterality: N/A;  . polyps of lung  2008   removed by [facility]  . TONSILLECTOMY    . VIDEO BRONCHOSCOPY N/A 03/09/2020   Procedure: VIDEO BRONCHOSCOPY;  Surgeon: [name], MD;  [facility];  Service: Thoracic;  Laterality: N/A;   Current Outpatient Medications on File Prior to Visit  Medication Sig Dispense Refill  . clopidogrel (PLAVIX) 75 MG tablet TAKE 1 TABLET BY MOUTH DAILY (Patient taking differently: Take 75 mg by mouth daily.) 90 tablet 3  .  Evolocumab (REPATHA SURECLICK) 702 MG/ML SOAJ ADMINISTER 1 ML UNDER THE SKIN EVERY 14 DAYS 2 mL 11  . fluticasone (FLONASE) 50 MCG/ACT nasal spray Place 2 sprays into both nostrils daily. (Patient taking differently: Place 2 sprays into both nostrils daily as needed.) 16 g 6  . hydrochlorothiazide (HYDRODIURIL) 25 MG tablet Take 1 tablet (25 mg total) by mouth daily as needed (edema). 30 tablet 11  . losartan (COZAAR) 100 MG tablet TAKE 1 TABLET BY MOUTH EVERY DAY 90 tablet 2  . metoprolol succinate (TOPROL-XL) 25 MG 24 hr tablet TAKE 1/2 TABLET BY MOUTH EVERY DAY 45 tablet 2  . PARoxetine (PAXIL) 40 MG tablet TAKE 1 TABLET(40 MG) BY MOUTH DAILY (Patient taking differently: Take 40 mg by mouth at bedtime.) 90 tablet 3  .  carboxymethylcellul-glycerin (OPTIVE) 0.5-0.9 % ophthalmic solution Place 1 drop into both eyes daily as needed for dry eyes. refresh    . nitroGLYCERIN (NITROSTAT) 0.4 MG SL tablet Place 1 tablet (0.4 mg total) under the tongue every 5 (five) minutes as needed for chest pain. 25 tablet 3   No current facility-administered medications on file prior to visit.   No Known Allergies Social History   Socioeconomic History  . Marital status: Married    Spouse name: Not on file  . Number of children: Not on file  . Years of education: Not on file  . Highest education level: Not on file  Occupational History  . Occupation: [employer]  Tobacco Use  . Smoking status: Former Smoker    Packs/day: 0.25    Years: 40.00    Pack years: 10.00    Types: Cigarettes    Start date: 45  . Smokeless tobacco: Never Used  . Tobacco comment: stopped 03/08/2020  Vaping Use  . Vaping Use: Never used  Substance and Sexual Activity  . Alcohol use: Yes    Alcohol/week: 0.0 standard drinks    Comment: occasionally  . Drug use: No  . Sexual activity: Not on file  Other Topics Concern  . 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   Social Determinants of Health   Financial Resource Strain: Not on file  Food Insecurity: Not on file  Transportation Needs: Not on file  Physical Activity: Not on file  Stress: Not on file  Social Connections: Not on file  Intimate Partner Violence: Not on file      Review of Systems  All other systems reviewed and are negative.      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  Therefore of asked the patient to come in next week assuming everyone is back at work for fasting labs.  He is overdue to check a CMP and a lipid panel.  Given his his history of ischemic heart disease, would like to see his LDL cholesterol well below 70.  His CMP was normal at the ER however I would like to follow that up along with give the patient a flu shot.  Also recommended a COVID booster 90 days after his initial infection.

## 2020-09-08 ENCOUNTER — Ambulatory Visit: Payer: BC Managed Care – PPO | Admitting: Internal Medicine

## 2020-09-08 ENCOUNTER — Encounter: Payer: Self-pay | Admitting: Internal Medicine

## 2020-09-08 ENCOUNTER — Other Ambulatory Visit: Payer: Self-pay

## 2020-09-08 VITALS — BP 122/68 | HR 78 | Temp 97.0°F | Ht 73.0 in | Wt 312.0 lb

## 2020-09-08 DIAGNOSIS — Z85118 Personal history of other malignant neoplasm of bronchus and lung: Secondary | ICD-10-CM | POA: Diagnosis not present

## 2020-09-08 DIAGNOSIS — G4733 Obstructive sleep apnea (adult) (pediatric): Secondary | ICD-10-CM

## 2020-09-08 DIAGNOSIS — Z9989 Dependence on other enabling machines and devices: Secondary | ICD-10-CM

## 2020-09-08 NOTE — Progress Notes (Signed)
[name]    [id number]    Dec 25, 1955  Primary Care Physician:Pickard, Cammie Mcgee, MD Date of Appointment: 09/08/2020 Established Patient Visit  Chief complaint:   Chief Complaint  Patient presents with  . Follow-up    F/U states he has been doing well since last visit. He has a cpap machine at home. Had COVID about 2 months ago.      HPI: [name] is a 65 y.o. gentleman with Stage 1A adenocarcinoma of the lung s/p resection with Dr. [name] in July 2021. Additional has OSA and is on CPAP  Interval Updates: Using CPAP at 12 cm H20, here for follow up today on sleep apnea and to review his sleep download. Unfortunately he refurbished his old machine because couldn't get [employer] to pay for a new one. Didn't bring machine with him. Feels dramatic improvement in symptoms including better fatigue  And energy level, no headaches.   He and his wife both had covid in December, their grandson also got it.  His wife was hospitalized for 5 days, he was treated as outpatient with MAB.   I have reviewed the patient's family social and past medical history and updated as appropriate.   Past Medical History:  Diagnosis Date  . Arthritis of knee    seeing Dr. Alfonso Ramus  . Cancer ([place])    Lung  . Coronary artery disease   . Depression with anxiety    hx of  . Exogenous obesity   . History of sleep apnea   . Hyperlipidemia   . Hypertension   . Panic attacks    hx of  . Sleep apnea    Uses a Cpap  . Smoker unmotivated to quit   . Varicose veins     Past Surgical History:  Procedure Laterality Date  . CARDIAC CATHETERIZATION  09/06/2008   stent to mid LAD and proximal LAD  . COLONOSCOPY N/A 05/01/2013   Procedure: COLONOSCOPY;  Surgeon: [name], MD;  [facility];  Service: Endoscopy;  Laterality: N/A;  8:30 AM  . EYE SURGERY  2006   Lasik on both eyes  . INTERCOSTAL NERVE BLOCK Left 03/09/2020   Procedure: INTERCOSTAL NERVE BLOCK;   Surgeon: [name], MD;  Location: [facility];  Service: Thoracic;  Laterality: Left;  . LOBECTOMY  03/09/2020   XI ROBOTIC ASSISTED THORASCOPY - LOWER LEFT LOBE (LLL) WEDGE RESECTION AND LLL LOBECTOMY (Left)  . LYMPH NODE DISSECTION N/A 03/09/2020   Procedure: LYMPH NODE DISSECTION;  Surgeon: [name], MD;  Location: [facility];  Service: Thoracic;  Laterality: N/A;  . polyps of lung  2008   removed by [facility]  . TONSILLECTOMY    . VIDEO BRONCHOSCOPY N/A 03/09/2020   Procedure: VIDEO BRONCHOSCOPY;  Surgeon: [name], MD;  [facility];  Service: Thoracic;  Laterality: N/A;    Family History  Problem Relation Age of Onset  . Heart disease Father        had cabg  . Esophageal cancer Father 34       esophageal  . Lung cancer Brother        lung cancer  . Colon cancer Neg Hx     Social History   Occupational History  . Occupation: [employer]  Tobacco Use  . Smoking status: Former Smoker    Packs/day: 0.25    Years: 40.00    Pack years: 10.00  Types: Cigarettes    Start date: 31  . Smokeless tobacco: Never Used  . Tobacco comment: stopped 03/08/2020  Vaping Use  . Vaping Use: Never used  Substance and Sexual Activity  . Alcohol use: Yes    Alcohol/week: 0.0 standard drinks    Comment: occasionally  . Drug use: No  . Sexual activity: Not on file     Physical Exam: Blood pressure 122/68, pulse 78, temperature (!) 97 F (36.1 C), temperature source Temporal, height 6\' 1"  (1.854 m), weight (!) 312 lb (141.5 kg), SpO2 98 %.  Gen:      No acute distress, bearded. Lungs:    No increased respiratory effort, symmetric chest wall excursion, clear to auscultation bilaterally, no wheezes or crackles CV:         Regular rate and rhythm; no murmurs, rubs, or gallops.  No pedal edema   Data Reviewed: Imaging: I have personally reviewed the chest xray July 2021 postoperative. Stable after lobectomy. No acute changes.  PFTs:  PFT  Results Latest Ref Rng & Units 01/22/2020  DLCO uncorrected ml/min/mmHg 35.36  DLCO UNC% % 123  DLCO corrected ml/min/mmHg 33.76  DLCO COR %Predicted % 117  DLVA Predicted % 113   I have personally reviewed the patient's PFTs and normal diffusion capacity.  Labs: Lab Results  Component Value Date   WBC 5.4 08/16/2020   HGB 14.2 08/16/2020   HCT 44.0 08/16/2020   MCV 98.0 08/16/2020   PLT 129 (L) 08/16/2020     Immunization status: Immunization History  Administered Date(s) Administered  . Pneumococcal Polysaccharide-23 12/21/2015  . Tdap 02/25/2013    Assessment:  OSA on CPAP - download reviewed today. Stage 1A lung cancer s/p left lower lobectomy  Plan/Recommendations: Continue CPAP at 12 cmh H20. He will have [employer] his download to [place] and just in case we will prescribe a new chip for his machine.   I recommend covid vaccine when he is able to in march.   Return to Care: Return in about 6 months (around 03/08/2021).   Lenice Llamas, MD [facility] [place]

## 2020-09-08 NOTE — Addendum Note (Signed)
Addended by: Valerie Salts on: 09/08/2020 09:22 AM   Modules accepted: Orders

## 2020-09-08 NOTE — Patient Instructions (Signed)
The patient should have follow up scheduled with myself in 6 months.   Please obtain your sleep download from [employer] and have them fax it to our office.

## 2020-09-19 ENCOUNTER — Other Ambulatory Visit: Payer: BC Managed Care – PPO

## 2020-09-19 ENCOUNTER — Other Ambulatory Visit: Payer: Self-pay

## 2020-09-19 DIAGNOSIS — E7800 Pure hypercholesterolemia, unspecified: Secondary | ICD-10-CM | POA: Diagnosis not present

## 2020-09-19 DIAGNOSIS — I10 Essential (primary) hypertension: Secondary | ICD-10-CM | POA: Diagnosis not present

## 2020-09-19 DIAGNOSIS — I259 Chronic ischemic heart disease, unspecified: Secondary | ICD-10-CM | POA: Diagnosis not present

## 2020-09-20 LAB — LIPID PANEL
Cholesterol: 181 mg/dL (ref <200)
HDL: 35 mg/dL — ABNORMAL LOW (ref >=40)
LDL Cholesterol (Calc): 123 mg/dL (calc) — ABNORMAL HIGH
Non-HDL Cholesterol (Calc): 146 mg/dL (calc) — ABNORMAL HIGH (ref <130)
Total CHOL/HDL Ratio: 5.2 (calc) — ABNORMAL HIGH (ref <5.0)
Triglycerides: 124 mg/dL (ref <150)

## 2020-09-20 LAB — COMPLETE METABOLIC PANEL WITH GFR
AG Ratio: 2.1 (calc) (ref 1.0–2.5)
ALT: 24 U/L (ref 9–46)
AST: 19 U/L (ref 10–35)
Albumin: 4.1 g/dL (ref 3.6–5.1)
Alkaline phosphatase (APISO): 55 U/L (ref 35–144)
BUN: 17 mg/dL (ref 7–25)
CO2: 25 mmol/L (ref 20–32)
Calcium: 9.1 mg/dL (ref 8.6–10.3)
Chloride: 107 mmol/L (ref 98–110)
Creat: 0.84 mg/dL (ref 0.70–1.25)
GFR, Est African American: 109 mL/min/{1.73_m2} (ref >=60)
GFR, Est Non African American: 94 mL/min/{1.73_m2} (ref >=60)
Globulin: 2 g/dL (calc) (ref 1.9–3.7)
Glucose, Bld: 99 mg/dL (ref 65–99)
Potassium: 4.3 mmol/L (ref 3.5–5.3)
Sodium: 140 mmol/L (ref 135–146)
Total Bilirubin: 0.4 mg/dL (ref 0.2–1.2)
Total Protein: 6.1 g/dL (ref 6.1–8.1)

## 2020-09-20 LAB — EXTRA LAV TOP TUBE

## 2020-10-27 ENCOUNTER — Other Ambulatory Visit: Payer: Self-pay | Admitting: Thoracic Surgery (Cardiothoracic Vascular Surgery)

## 2020-10-27 DIAGNOSIS — Z9889 Other specified postprocedural states: Secondary | ICD-10-CM

## 2020-10-28 ENCOUNTER — Ambulatory Visit: Payer: BC Managed Care – PPO | Admitting: Thoracic Surgery (Cardiothoracic Vascular Surgery)

## 2020-10-28 ENCOUNTER — Encounter: Payer: Self-pay | Admitting: Thoracic Surgery (Cardiothoracic Vascular Surgery)

## 2020-10-28 ENCOUNTER — Ambulatory Visit
Admission: RE | Admit: 2020-10-28 | Discharge: 2020-10-28 | Disposition: A | Discharge status: Home / Self Care | Payer: BC Managed Care – PPO | Source: Ambulatory Visit | Attending: Thoracic Surgery (Cardiothoracic Vascular Surgery) | Admitting: Thoracic Surgery (Cardiothoracic Vascular Surgery)

## 2020-10-28 ENCOUNTER — Other Ambulatory Visit: Payer: Self-pay

## 2020-10-28 VITALS — BP 136/77 | HR 62 | Resp 20 | Ht 73.0 in | Wt 319.0 lb

## 2020-10-28 DIAGNOSIS — Z9889 Other specified postprocedural states: Secondary | ICD-10-CM

## 2020-10-28 DIAGNOSIS — J984 Other disorders of lung: Secondary | ICD-10-CM | POA: Diagnosis not present

## 2020-10-28 NOTE — Progress Notes (Signed)
[address]       [place] [address]             [phone]                    [place] [address] [facility] [facility] Medical Record #[id number] Date of Birth: 04/15/1956  Referring: Melvenia Needles, NP Primary Care: [name], MD Primary Cardiologist: [name], MD  Chief Complaint:    Chief Complaint  Patient presents with  . Follow-up    Lung cancer f/u s/p RATS LLL lobectomy 03/09/20    History of Present Illness:    [name] 65 y.o. male presents for 74-month follow-up appointment.  He is status post a robotic assisted left lower lobectomy in July 2021 for a stage Ia adenocarcinoma.  He is doing well.  He denies any shortness of breath.  He has gone back to smoking occasionally.  But attempts to refrain is much as possible.    Past Medical History:  Diagnosis Date  . Arthritis of knee    seeing Dr. Alfonso Ramus  . Cancer ([place])    Lung  . Coronary artery disease   . Depression with anxiety    hx of  . Exogenous obesity   . History of sleep apnea   . Hyperlipidemia   . Hypertension   . Panic attacks    hx of  . Sleep apnea    Uses a Cpap  . Smoker unmotivated to quit   . Varicose veins     Past Surgical History:  Procedure Laterality Date  . CARDIAC CATHETERIZATION  09/06/2008   stent to mid LAD and proximal LAD  . COLONOSCOPY N/A 05/01/2013   Procedure: COLONOSCOPY;  Surgeon: [name], MD;  [facility];  Service: Endoscopy;  Laterality: N/A;  8:30 AM  . EYE SURGERY  2006   Lasik on both eyes  . INTERCOSTAL NERVE BLOCK Left 03/09/2020   Procedure: INTERCOSTAL NERVE BLOCK;  Surgeon: [name], MD;  Location: [facility];  Service: Thoracic;  Laterality: Left;  . LOBECTOMY  03/09/2020   XI ROBOTIC ASSISTED THORASCOPY - LOWER LEFT LOBE (LLL) WEDGE RESECTION AND LLL LOBECTOMY (Left)  . LYMPH NODE DISSECTION N/A 03/09/2020   Procedure: LYMPH NODE DISSECTION;  Surgeon: [name], MD;  Location: [facility];   Service: Thoracic;  Laterality: N/A;  . polyps of lung  2008   removed by [facility]  . TONSILLECTOMY    . VIDEO BRONCHOSCOPY N/A 03/09/2020   Procedure: VIDEO BRONCHOSCOPY;  Surgeon: [name], MD;  [facility];  Service: Thoracic;  Laterality: N/A;    Family History  Problem Relation Age of Onset  . Heart disease Father        had cabg  . Esophageal cancer Father 49       esophageal  . Lung cancer Brother        lung cancer  . Colon cancer Neg Hx      Social History   Tobacco Use  Smoking Status Former Smoker  . Packs/day: 0.25  . Years: 40.00  . Pack years: 10.00  . Types: Cigarettes  . Start date: 1973  Smokeless Tobacco Never Used  Tobacco Comment   stopped 03/08/2020    Social History   Substance and Sexual Activity  Alcohol Use Yes  . Alcohol/week: 0.0 standard drinks   Comment: occasionally     No Known Allergies  Current Outpatient Medications  Medication Sig Dispense Refill  . carboxymethylcellul-glycerin (OPTIVE) 0.5-0.9 % ophthalmic solution Place 1 drop into both eyes daily as needed for dry eyes. refresh    . clopidogrel (PLAVIX) 75 MG tablet TAKE 1 TABLET BY MOUTH DAILY (Patient taking differently: Take 75 mg by mouth daily.) 90 tablet 3  . Evolocumab (REPATHA SURECLICK) 379 MG/ML SOAJ ADMINISTER 1 ML UNDER THE SKIN EVERY 14 DAYS 2 mL 11  . fluticasone (FLONASE) 50 MCG/ACT nasal spray Place 2 sprays into both nostrils daily. (Patient taking differently: Place 2 sprays into both nostrils daily as needed.) 16 g 6  . hydrochlorothiazide (HYDRODIURIL) 25 MG tablet Take 1 tablet (25 mg total) by mouth daily as needed (edema). 30 tablet 11  . losartan (COZAAR) 100 MG tablet TAKE 1 TABLET BY MOUTH EVERY DAY 90 tablet 2  . metoprolol succinate (TOPROL-XL) 25 MG 24 hr tablet TAKE 1/2 TABLET BY MOUTH EVERY DAY 45 tablet 2  . nitroGLYCERIN (NITROSTAT) 0.4 MG SL tablet Place 1 tablet (0.4 mg total) under the tongue every 5 (five) minutes as  needed for chest pain. 25 tablet 3  . PARoxetine (PAXIL) 40 MG tablet TAKE 1 TABLET(40 MG) BY MOUTH DAILY (Patient taking differently: Take 40 mg by mouth at bedtime.) 90 tablet 3   No current facility-administered medications for this visit.    Review of Systems  All other systems reviewed and are negative.   PHYSICAL EXAMINATION: BP 136/77 (BP Location: Right Arm, Patient Position: Sitting)   Pulse 62   Resp 20   Ht 6\' 1"  (1.854 m)   Wt (!) 319 lb (144.7 kg)   SpO2 95% Comment: RA  BMI 42.09 kg/m   Physical Exam Constitutional:      Appearance: Normal appearance. He is obese.  HENT:     Head: Normocephalic.  Eyes:     Extraocular Movements: Extraocular movements intact.  Cardiovascular:     Rate and Rhythm: Normal rate.  Pulmonary:     Effort: Pulmonary effort is normal. No respiratory distress.  Musculoskeletal:     Cervical back: Normal range of motion.  Neurological:     General: No focal deficit present.     Mental Status: He is alert and oriented to person, place, and time.      Diagnostic Studies & Laboratory data:     Recent Radiology Findings:   DG Chest 2 View  Result Date: 10/28/2020 CLINICAL DATA:  History of robot assisted left lobectomy EXAM: CHEST - 2 VIEW COMPARISON:  08/16/2020 FINDINGS: Frontal and lateral views of the chest demonstrate an unremarkable cardiac silhouette. Minimal scarring at the left lung base. No airspace disease, effusion, or pneumothorax. No acute bony abnormalities. IMPRESSION: 1. No acute intrathoracic process. Electronically Signed   By: Randa Ngo M.D.   On: 10/28/2020 15:04       I have independently reviewed the above radiology studies  and reviewed the findings with the patient.   Recent Lab Findings: Lab Results  Component Value Date   WBC 5.4 08/16/2020   HGB 14.2 08/16/2020   HCT 44.0 08/16/2020   PLT 129 (L) 08/16/2020   GLUCOSE 99 09/19/2020   CHOL 181 09/19/2020   TRIG 124 09/19/2020   HDL 35 (L)  09/19/2020   LDLDIRECT 178 (H) 11/18/2017   LDLCALC 123 (H) 09/19/2020   ALT 24 09/19/2020   AST 19 09/19/2020   NA 140 09/19/2020   K 4.3 09/19/2020   CL 107 09/19/2020   CREATININE  0.84 09/19/2020   BUN 17 09/19/2020   CO2 25 09/19/2020   TSH 1.71 12/21/2015   INR 1.0 03/07/2020         Assessment / Plan:   65 year old male with history of stage Ia adenocarcinoma status post resection in July 2021, currently doing well.  We discussed the importance of smoking cessation given his history of lung cancer.  I will see him back in 6 months with a CTA chest.      [name] 10/28/2020 4:06 PM

## 2020-11-17 DIAGNOSIS — M170 Bilateral primary osteoarthritis of knee: Secondary | ICD-10-CM | POA: Diagnosis not present

## 2021-01-18 MED ORDER — NITROGLYCERIN 0.4 MG SL SUBL: 0.4000 mg | SUBLINGUAL_TABLET | SUBLINGUAL | 0 refills | Status: DC | PRN

## 2021-01-18 MED ORDER — HYDROCHLOROTHIAZIDE 25 MG PO TABS: 25.0000 mg | ORAL_TABLET | Freq: Every day | ORAL | 0 refills | Status: DC | PRN

## 2021-01-19 ENCOUNTER — Other Ambulatory Visit: Payer: Self-pay | Admitting: Cardiovascular Disease

## 2021-01-19 MED ORDER — HYDROCHLOROTHIAZIDE 25 MG PO TABS: 25.0000 mg | ORAL_TABLET | Freq: Every day | ORAL | 1 refills | Status: DC | PRN

## 2021-01-19 MED ORDER — NITROGLYCERIN 0.4 MG SL SUBL: 0.4000 mg | SUBLINGUAL_TABLET | SUBLINGUAL | 0 refills | Status: DC | PRN

## 2021-01-19 NOTE — Addendum Note (Signed)
Addended by: Gaetano Net on: 01/19/2021 07:21 AM   Modules accepted: Orders

## 2021-01-31 ENCOUNTER — Other Ambulatory Visit: Payer: Self-pay | Admitting: Cardiovascular Disease

## 2021-01-31 ENCOUNTER — Other Ambulatory Visit: Payer: Self-pay | Admitting: Family Medicine

## 2021-01-31 DIAGNOSIS — F410 Panic disorder [episodic paroxysmal anxiety] without agoraphobia: Secondary | ICD-10-CM

## 2021-03-09 NOTE — Progress Notes (Signed)
Date:  03/17/2021   ID:  [name], [name] [birth year], MRN [id number]  PCP:  [name], MD  Cardiologist:  [name], MD   Electrophysiologist:  None   Evaluation Performed:  Follow-Up Visit  Chief Complaint:  CAD/ HLD   History of Present Illness:    65 y.o. 2010 had stenting proximal and mid LAD with DES by [name]. Non ischemic myovue 04/24/16 Had myalgias on crestor and lipitor Now on Repatha  Varicose veins post ablative Rx by [name]. 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 Alfonso Ramus   Cancer [facility])    Lung   Coronary artery disease    Depression with anxiety    hx of   Exogenous obesity    History of sleep apnea    Hyperlipidemia    Hypertension    Panic attacks    hx of   Sleep apnea    Uses a Cpap   Smoker unmotivated to quit    Varicose veins    Past Surgical History:  Procedure Laterality Date   CARDIAC CATHETERIZATION  09/06/2008   stent to mid LAD and proximal LAD   COLONOSCOPY N/A 05/01/2013   Procedure: COLONOSCOPY;  Surgeon: [name], MD;  [facility];  Service: Endoscopy;  Laterality: N/A;  8:30 AM   EYE SURGERY  2006   Lasik on both eyes   INTERCOSTAL NERVE BLOCK Left 03/09/2020   Procedure: INTERCOSTAL NERVE BLOCK;  Surgeon: [name], MD;  Location: [facility];  Service: Thoracic;  Laterality: Left;   LOBECTOMY  03/09/2020   XI ROBOTIC ASSISTED THORASCOPY - LOWER LEFT LOBE (LLL) WEDGE RESECTION AND LLL  LOBECTOMY (Left)   LYMPH NODE DISSECTION N/A 03/09/2020   Procedure: LYMPH NODE DISSECTION;  Surgeon: [name], MD;  Location: [facility];  Service: Thoracic;  Laterality: N/A;   polyps of lung  2008   removed by [facility]   TONSILLECTOMY     VIDEO BRONCHOSCOPY N/A 03/09/2020   Procedure: VIDEO BRONCHOSCOPY;  Surgeon: [name], MD;  [facility];  Service: Thoracic;  Laterality: N/A;     Current Meds  Medication Sig   carboxymethylcellul-glycerin (OPTIVE) 0.5-0.9 % ophthalmic solution Place 1 drop into both eyes daily as needed for dry eyes. refresh   clopidogrel (PLAVIX) 75 MG tablet Take 1 tablet (75 mg total) by mouth daily. Patient needs to keep upcoming appointment for # 90 supply.   Evolocumab (REPATHA SURECLICK) 322 MG/ML SOAJ ADMINISTER 1 ML UNDER THE SKIN EVERY 14 DAYS   fluticasone (FLONASE) 50 MCG/ACT nasal spray Place 2 sprays into both nostrils daily. (Patient taking differently: Place 2 sprays into both nostrils daily as needed.)   hydrochlorothiazide (HYDRODIURIL) 25 MG tablet Take 1 tablet (25 mg total) by mouth daily as needed (edema).   losartan (COZAAR) 100 MG tablet Take 1 tablet (100 mg total) by  mouth daily. Patient needs to keep upcoming appointment for # 90 supply.   metoprolol succinate (TOPROL-XL) 25 MG 24 hr tablet Take 0.5 tablets (12.5 mg total) by mouth daily. Patient needs to keep upcoming appointment for # 90 supply.   nitroGLYCERIN (NITROSTAT) 0.4 MG SL tablet Place 1 tablet (0.4 mg total) under the tongue every 5 (five) minutes as needed for chest pain.   PARoxetine (PAXIL) 40 MG tablet TAKE 1 TABLET(40 MG) BY MOUTH DAILY     Allergies:   Patient has no known allergies.   Social History   Tobacco Use   Smoking status: Former    Packs/day: 0.25    Years: 40.00    Pack years: 10.00    Types: Cigarettes    Start date: 1973   Smokeless tobacco: Never   Tobacco comments:    stopped 03/08/2020  Vaping Use   Vaping Use:  Never used  Substance Use Topics   Alcohol use: Yes    Alcohol/week: 0.0 standard drinks    Comment: occasionally   Drug use: No     Family Hx: The patient's family history includes Esophageal cancer (age of onset: 61) in his father; Heart disease in his father; Lung cancer in his brother. There is no history of Colon cancer.  ROS:   Please see the history of present illness.     All other systems reviewed and are negative.   Prior CV studies:   The following studies were reviewed today:  Myovue 04/24/16  Labs/Other Tests and Data Reviewed:    EKG:  10/31/17 SR rate 68 normal  12/22/19 SR rate 59 normal 03/17/2021 SR normal rate 68  Recent Labs: 08/16/2020: Hemoglobin 14.2; Platelets 129 09/19/2020: ALT 24; BUN 17; Creat 0.84; Potassium 4.3; Sodium 140   Recent Lipid Panel Lab Results  Component Value Date/Time   CHOL 181 09/19/2020 08:37 AM   CHOL 151 12/31/2017 08:20 AM   TRIG 124 09/19/2020 08:37 AM   HDL 35 (L) 09/19/2020 08:37 AM   HDL 31 (L) 12/31/2017 08:20 AM   CHOLHDL 5.2 (H) 09/19/2020 08:37 AM   LDLCALC 123 (H) 09/19/2020 08:37 AM   LDLDIRECT 178 (H) 11/18/2017 04:28 PM    Wt Readings from Last 3 Encounters:  03/17/21 (!) 143.8 kg  10/28/20 (!) 144.7 kg  09/08/20 (!) 141.5 kg     Objective:    Vital Signs:  BP 120/66   Pulse 63   Ht 6\' 1"  (1.854 m)   Wt (!) 143.8 kg   SpO2 96%   BMI 41.82 kg/m    Affect appropriate Obese white male  HEENT: normal Neck supple with no adenopathy JVP normal no bruits no thyromegaly Lungs clear with no wheezing and good diaphragmatic motionpost LLL lobectomy  S1/S2 no murmur, no rub, gallop or click PMI normal Abdomen: benighn, BS positve, no tenderness, no AAA no bruit.  No HSM or HJR Distal pulses intact with no bruits LE varicosities right >left with edema and stasis  Neuro non-focal Skin warm and dry No muscular weakness   ASSESSMENT & PLAN:    1. CAD: stenting LAD 2010 non ischemic myovue 2017 no  chest pain continue medical Rx 2. HLD:  LDL 79 continue Repatha   3. Obesity:  Discussed low carb diet and portion control  4.  Bilateral varicose veins.  F/U [facility] post ablative Rx with chronic stasis referred back to [place] [facility] specialist  Has PRN HCTZ  5. Ortho: post arthroscopic surgery on left  with improvement f/u Dr Darene Lamer. Percell Miller  6. Smoking:  lung cancer CT 01/13/20 with 8 mm lesion LLL, positive PET now post LLL lobectomy f/u pulmonary and oncology   Medication Adjustments/Labs and Tests Ordered: Current medicines are reviewed at length with the patient today.  Concerns regarding medicines are outlined above.   Tests Ordered:  None   Medication Changes: No orders of the defined types were placed in this encounter.   Disposition:  Follow up in a year   Signed, [name], MD  03/17/2021 8:13 AM    [place]

## 2021-03-17 ENCOUNTER — Encounter: Payer: Self-pay | Admitting: Cardiovascular Disease

## 2021-03-17 ENCOUNTER — Other Ambulatory Visit: Payer: Self-pay

## 2021-03-17 ENCOUNTER — Ambulatory Visit: Payer: BC Managed Care – PPO | Admitting: Cardiovascular Disease

## 2021-03-17 VITALS — BP 120/66 | HR 63 | Ht 73.0 in | Wt 317.0 lb

## 2021-03-17 DIAGNOSIS — E785 Hyperlipidemia, unspecified: Secondary | ICD-10-CM

## 2021-03-17 DIAGNOSIS — I2510 Atherosclerotic heart disease of native coronary artery without angina pectoris: Secondary | ICD-10-CM

## 2021-03-17 DIAGNOSIS — I8393 Asymptomatic varicose veins of bilateral lower extremities: Secondary | ICD-10-CM | POA: Diagnosis not present

## 2021-03-17 DIAGNOSIS — C3432 Malignant neoplasm of lower lobe, left bronchus or lung: Secondary | ICD-10-CM

## 2021-03-17 NOTE — Patient Instructions (Addendum)
Medication Instructions:  *If you need a refill on your cardiac medications before your next appointment, please call your pharmacy*  Lab Work: If you have labs (blood work) drawn today and your tests are completely normal, you will receive your results only by: MyChart Message (if you have MyChart) OR A paper copy in the mail If you have any lab test that is abnormal or we need to change your treatment, we will call you to review the results.  Testing/Procedures: None ordered today.  Follow-Up: At [facility], you and your health needs are our priority.  As part of our continuing mission to provide you with exceptional heart care, we have created designated Provider Care Teams.  These Care Teams include your primary Cardiologist (physician) and Advanced Practice Providers (APPs -  Physician Assistants and Nurse Practitioners) who all work together to provide you with the care you need, when you need it.  We recommend signing up for the patient portal called "MyChart".  Sign up information is provided on this After Visit Summary.  MyChart is used to connect with patients for Virtual Visits (Telemedicine).  Patients are able to view lab/test results, encounter notes, upcoming appointments, etc.  Non-urgent messages can be sent to your provider as well.   To learn more about what you can do with MyChart, go to [email or web address]://[email or web address].com.    Your next appointment:   12 month(s)  The format for your next appointment:   In Person  Provider:   You may see Peter Nishan, MD or one of the following Advanced Practice Providers on your designated Care Team:   Laura Ingold, NP  

## 2021-04-06 ENCOUNTER — Other Ambulatory Visit: Payer: Self-pay | Admitting: Thoracic Surgery (Cardiothoracic Vascular Surgery)

## 2021-04-06 DIAGNOSIS — Z9889 Other specified postprocedural states: Secondary | ICD-10-CM

## 2021-04-11 ENCOUNTER — Other Ambulatory Visit: Payer: Self-pay | Admitting: Thoracic Surgery (Cardiothoracic Vascular Surgery)

## 2021-04-11 DIAGNOSIS — Z9889 Other specified postprocedural states: Secondary | ICD-10-CM

## 2021-04-17 ENCOUNTER — Telehealth: Payer: Self-pay | Admitting: Family Medicine

## 2021-04-17 ENCOUNTER — Telehealth (INDEPENDENT_AMBULATORY_CARE_PROVIDER_SITE_OTHER): Payer: BC Managed Care – PPO | Admitting: Nurse Practitioner

## 2021-04-17 ENCOUNTER — Other Ambulatory Visit: Payer: Self-pay

## 2021-04-17 DIAGNOSIS — U071 COVID-19: Secondary | ICD-10-CM | POA: Diagnosis not present

## 2021-04-17 MED ORDER — NIRMATRELVIR/RITONAVIR (PAXLOVID)TABLET: 3.0000 | ORAL_TABLET | Freq: Two times a day (BID) | ORAL | 0 refills | Status: AC

## 2021-04-17 MED ORDER — GUAIFENESIN ER 600 MG PO TB12: 600.0000 mg | ORAL_TABLET | Freq: Two times a day (BID) | ORAL | 0 refills | Status: DC | PRN

## 2021-04-17 NOTE — Telephone Encounter (Signed)
Please schedule virtual visit with me today or PCP if he wants later this week

## 2021-04-17 NOTE — Progress Notes (Signed)
Subjective:    Patient ID: [name], male    DOB: 09-25-1955, 65 y.o.   MRN: [id number]  HPI: [name] is a 65 y.o. male presenting virtually for COVID-19.  Chief Complaint  Patient presents with   Covid Positive    UPPER RESPIRATORY TRACT INFECTION Onset: Saturday COVID-19 testing history: tested positive 04/16/21 COVID-19 vaccination status: no vaccines Fever: no Body aches: yes Cough: yes; congested Shortness of breath: no Wheezing: no Chest pain: no Chest tightness: no Chest congestion: yes Nasal congestion: yes Runny nose: yes Post nasal drip: yes Sneezing: yes Sore throat: yes Swollen glands: no Sinus pressure: yes Headache: yes Face pain: no Toothache: no Ear pain: yes ; right ear Ear pressure: no  Eyes red/itching:no Eye drainage/crusting: no  Nausea: no  Vomiting: no Diarrhea: no  Change in appetite:  yes, decreased   Loss of taste/smell: no  Rash: no Fatigue: yes Sick contacts: yes Strep contacts: no  Context: stable Recurrent sinusitis: no Treatments attempted:  flonase, ibuprofen Relief with OTC medications: no  No Known Allergies  Outpatient Encounter Medications as of 04/17/2021  Medication Sig   guaiFENesin (MUCINEX) 600 MG 12 hr tablet Take 1 tablet (600 mg total) by mouth 2 (two) times daily as needed for cough or to loosen phlegm.   nirmatrelvir/ritonavir EUA (PAXLOVID) 20 x 150 MG & 10 x 100MG  TABS Take 3 tablets by mouth 2 (two) times daily for 5 days. (Take nirmatrelvir 150 mg two tablets twice daily for 5 days and ritonavir 100 mg one tablet twice daily for 5 days) Patient GFR is 94   carboxymethylcellul-glycerin (OPTIVE) 0.5-0.9 % ophthalmic solution Place 1 drop into both eyes daily as needed for dry eyes. refresh   clopidogrel (PLAVIX) 75 MG tablet Take 1 tablet (75 mg total) by mouth daily. Patient needs to keep upcoming appointment for # 90 supply.   Evolocumab (REPATHA SURECLICK) 916 MG/ML SOAJ ADMINISTER 1 ML  UNDER THE SKIN EVERY 14 DAYS   fluticasone (FLONASE) 50 MCG/ACT nasal spray Place 2 sprays into both nostrils daily. (Patient taking differently: Place 2 sprays into both nostrils daily as needed.)   hydrochlorothiazide (HYDRODIURIL) 25 MG tablet Take 1 tablet (25 mg total) by mouth daily as needed (edema).   losartan (COZAAR) 100 MG tablet Take 1 tablet (100 mg total) by mouth daily. Patient needs to keep upcoming appointment for # 90 supply.   metoprolol succinate (TOPROL-XL) 25 MG 24 hr tablet Take 0.5 tablets (12.5 mg total) by mouth daily. Patient needs to keep upcoming appointment for # 90 supply.   nitroGLYCERIN (NITROSTAT) 0.4 MG SL tablet Place 1 tablet (0.4 mg total) under the tongue every 5 (five) minutes as needed for chest pain.   PARoxetine (PAXIL) 40 MG tablet TAKE 1 TABLET(40 MG) BY MOUTH DAILY   No facility-administered encounter medications on file as of 04/17/2021.    Patient Active Problem List   Diagnosis Date Noted   COVID-19 08/16/2020   S/P robot-assisted surgical procedure 03/09/2020   Vitamin D deficiency 12/27/2015   Essential hypertension 12/21/2015   Varicose veins of left lower extremity with complications 38/46/6599   Varicose veins of right lower extremity with complications 35/70/1779   Varicose veins of bilateral lower extremities with other complications 39/10/90   Varicose veins of leg with complications 33/00/7622   Varicose veins of both lower extremities 03/03/2015   Smoker unmotivated to quit    Malaise and fatigue 01/03/2012   Vertigo 01/03/2012   Ischemic  heart disease 06/25/2011   Hypercholesterolemia 06/25/2011   Panic attacks    Arthritis of knee    Exogenous obesity    History of sleep apnea    Depression with anxiety     Past Medical History:  Diagnosis Date   Arthritis of knee    seeing Dr. Alfonso Ramus   Cancer [facility])    Lung   Coronary artery disease    Depression with anxiety    hx of   Exogenous obesity    History of sleep  apnea    Hyperlipidemia    Hypertension    Panic attacks    hx of   Sleep apnea    Uses a Cpap   Smoker unmotivated to quit    Varicose veins     Relevant past medical, surgical, family and social history reviewed and updated as indicated. Interim medical history since our last visit reviewed.  Review of Systems Per HPI unless specifically indicated above     Objective:    There were no vitals taken for this visit.  Wt Readings from Last 3 Encounters:  03/17/21 (!) 317 lb (143.8 kg)  10/28/20 (!) 319 lb (144.7 kg)  09/08/20 (!) 312 lb (141.5 kg)    Physical Exam Vitals and nursing note reviewed.  Constitutional:      General: He is not in acute distress.    Appearance: Normal appearance. He is not ill-appearing or toxic-appearing.  HENT:     Head: Normocephalic and atraumatic.     Right Ear: External ear normal.     Left Ear: External ear normal.     Nose: Congestion present. No rhinorrhea.     Mouth/Throat:     Mouth: Mucous membranes are moist.     Pharynx: Oropharynx is clear.  Eyes:     General: No scleral icterus.       Right eye: No discharge.        Left eye: No discharge.  Cardiovascular:     Comments: Unable to assess heart sounds via virtual visit. Pulmonary:     Effort: Pulmonary effort is normal. No respiratory distress.     Comments: Unable to assess lung sounds via virtual visit.  Patient talking in complete sentences during telemedicine visit without accessory muscle use. Skin:    Coloration: Skin is not jaundiced or pale.     Findings: No erythema.  Neurological:     Mental Status: He is alert and oriented to person, place, and time.  Psychiatric:        Mood and Affect: Mood normal.        Behavior: Behavior normal.        Thought Content: Thought content normal.        Judgment: Judgment normal.      Assessment & Plan:  1. COVID-19 Acute.  Symptoms started 8/20 and are stable.  No chest pain or shortness of breath.  Has had COVID-19 in  the past and had monoclonal antibody infusion at that time.  He has not received any of the vaccines.  Given comorbidities, high risk for hospitalization.  Will treat with Macrobid 3 tablets twice daily for 5 days.  Also start Mucinex to help with congestion.  Push fluids.  Follow-up if symptoms do not gradually improve after 7 to 10 days.  With any sudden onset new chest pain, dizziness, sweating, or shortness of breath, go to ED.   - nirmatrelvir/ritonavir EUA (PAXLOVID) 20 x 150 MG & 10 x 100MG  TABS; Take  3 tablets by mouth 2 (two) times daily for 5 days. (Take nirmatrelvir 150 mg two tablets twice daily for 5 days and ritonavir 100 mg one tablet twice daily for 5 days) Patient GFR is 94  Dispense: 30 tablet; Refill: 0 - guaiFENesin (MUCINEX) 600 MG 12 hr tablet; Take 1 tablet (600 mg total) by mouth 2 (two) times daily as needed for cough or to loosen phlegm.  Dispense: 30 tablet; Refill: 0    Follow up plan: Return if symptoms worsen or fail to improve.  Due to the catastrophic nature of the COVID-19 pandemic, this video visit was completed soley via audio and visual contact via Caregility due to the restrictions of the COVID-19 pandemic.  All issues as above were discussed and addressed. Physical exam was done as above through visual confirmation on Caregility. If it was felt that the patient should be evaluated in the office, they were directed there. The patient verbally consented to this visit. Location of the patient: home Location of the provider: work Those involved with this call:  Provider: Noemi Chapel, DNP, FNP-C CMA: n/a Front Desk/Registration: Vevelyn Pat  Time spent on call:  8 minutes with patient face to face via video conference. More than 50% of this time was spent in counseling and coordination of care. 10 minutes total spent in review of patient's record and preparation of their chart. I verified patient identity using two factors (patient name and date of  birth). Patient consents verbally to being seen via telemedicine visit today.

## 2021-04-17 NOTE — Telephone Encounter (Signed)
Patient called to request call back from nurse to receive advice for managing COVID symptoms. Please advise at [phone].

## 2021-04-20 ENCOUNTER — Encounter: Payer: Self-pay | Admitting: Nurse Practitioner

## 2021-04-28 DIAGNOSIS — M170 Bilateral primary osteoarthritis of knee: Secondary | ICD-10-CM | POA: Diagnosis not present

## 2021-05-12 ENCOUNTER — Ambulatory Visit
Admission: RE | Admit: 2021-05-12 | Discharge: 2021-05-12 | Disposition: A | Discharge status: Home / Self Care | Payer: BC Managed Care – PPO | Source: Ambulatory Visit | Attending: Thoracic Surgery (Cardiothoracic Vascular Surgery) | Admitting: Thoracic Surgery (Cardiothoracic Vascular Surgery)

## 2021-05-12 ENCOUNTER — Telehealth (INDEPENDENT_AMBULATORY_CARE_PROVIDER_SITE_OTHER): Payer: BC Managed Care – PPO | Admitting: Thoracic Surgery (Cardiothoracic Vascular Surgery)

## 2021-05-12 ENCOUNTER — Other Ambulatory Visit: Payer: Self-pay

## 2021-05-12 DIAGNOSIS — Z9889 Other specified postprocedural states: Secondary | ICD-10-CM

## 2021-05-12 DIAGNOSIS — I700 Atherosclerosis of aorta: Secondary | ICD-10-CM | POA: Diagnosis not present

## 2021-05-12 DIAGNOSIS — J439 Emphysema, unspecified: Secondary | ICD-10-CM | POA: Diagnosis not present

## 2021-05-12 DIAGNOSIS — C3490 Malignant neoplasm of unspecified part of unspecified bronchus or lung: Secondary | ICD-10-CM | POA: Diagnosis not present

## 2021-05-12 NOTE — Progress Notes (Signed)
     [address]       [place] [address]             [phone]       Patient: Home Provider: Office Consent for Telemedicine visit obtained.  Today's visit was completed via a real-time telehealth (see specific modality noted below). The patient/authorized person provided oral consent at the time of the visit to engage in a telemedicine encounter with the present provider at [facility]. The patient/authorized person was informed of the potential benefits, limitations, and risks of telemedicine. The patient/authorized person expressed understanding that the laws that protect confidentiality also apply to telemedicine. The patient/authorized person acknowledged understanding that telemedicine does not provide emergency services and that he or she would need to call 911 or proceed to the nearest hospital for help if such a need arose.   Total time spent in the clinical discussion 10 minutes.  Telehealth Modality: Phone visit (audio only)  I had a telephone visit with Mr. 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  Overall he is doing well.  He did contract COVID pneumonia in August of this year but currently is doing well.  I personally reviewed his cross-sectional imaging from today, and there is no evidence of any new or recurrent disease.  I will see him again in 58months with a chest x-ray.  Lovinia Snare Bary Leriche

## 2021-06-13 DIAGNOSIS — D485 Neoplasm of uncertain behavior of skin: Secondary | ICD-10-CM | POA: Diagnosis not present

## 2021-06-13 DIAGNOSIS — D225 Melanocytic nevi of trunk: Secondary | ICD-10-CM | POA: Diagnosis not present

## 2021-06-13 DIAGNOSIS — C44519 Basal cell carcinoma of skin of other part of trunk: Secondary | ICD-10-CM | POA: Diagnosis not present

## 2021-06-13 DIAGNOSIS — L821 Other seborrheic keratosis: Secondary | ICD-10-CM | POA: Diagnosis not present

## 2021-07-06 ENCOUNTER — Ambulatory Visit: Payer: BC Managed Care – PPO | Admitting: Family Medicine

## 2021-07-06 ENCOUNTER — Other Ambulatory Visit: Payer: Self-pay

## 2021-07-06 ENCOUNTER — Encounter: Payer: Self-pay | Admitting: Family Medicine

## 2021-07-06 VITALS — BP 128/68 | HR 69 | Temp 97.7°F | Resp 18 | Ht 73.0 in | Wt 298.0 lb

## 2021-07-06 DIAGNOSIS — J329 Chronic sinusitis, unspecified: Secondary | ICD-10-CM

## 2021-07-06 DIAGNOSIS — J310 Chronic rhinitis: Secondary | ICD-10-CM

## 2021-07-06 MED ORDER — AMOXICILLIN-POT CLAVULANATE 875-125 MG PO TABS: 1.0000 | ORAL_TABLET | Freq: Two times a day (BID) | ORAL | 0 refills | Status: DC

## 2021-07-06 MED ORDER — LEVOCETIRIZINE DIHYDROCHLORIDE 5 MG PO TABS: 5.0000 mg | ORAL_TABLET | Freq: Every evening | ORAL | 3 refills | Status: AC

## 2021-07-06 NOTE — Progress Notes (Signed)
Subjective:    Patient ID: [name], male    DOB: 10/15/1955, 65 y.o.   MRN: [id number]  HPI  Patient states he has been sick for about 4 weeks.  He reports nasal congestion, postnasal drip, and cough.  He states it started with allergies.  However the symptoms have progressed and now he has a constant daily headache in the center of his head and even in his occiput.  He denies any fever or chills.  He denies any shortness of breath.  He denies any pleurisy.  He does report a dull constant headaches and pain and pressure in his sinuses.  He reports constant rhinorrhea and head congestion Past Medical History:  Diagnosis Date   Arthritis of knee    seeing Dr. Alfonso Ramus   Cancer [facility])    Lung   Coronary artery disease    Depression with anxiety    hx of   Exogenous obesity    History of sleep apnea    Hyperlipidemia    Hypertension    Panic attacks    hx of   Sleep apnea    Uses a Cpap   Smoker unmotivated to quit    Varicose veins    Past Surgical History:  Procedure Laterality Date   CARDIAC CATHETERIZATION  09/06/2008   stent to mid LAD and proximal LAD   COLONOSCOPY N/A 05/01/2013   Procedure: COLONOSCOPY;  Surgeon: [name], MD;  [facility];  Service: Endoscopy;  Laterality: N/A;  8:30 AM   EYE SURGERY  2006   Lasik on both eyes   INTERCOSTAL NERVE BLOCK Left 03/09/2020   Procedure: INTERCOSTAL NERVE BLOCK;  Surgeon: [name], MD;  Location: [place];  Service: Thoracic;  Laterality: Left;   LOBECTOMY  03/09/2020   XI ROBOTIC ASSISTED THORASCOPY - LOWER LEFT LOBE (LLL) WEDGE RESECTION AND LLL LOBECTOMY (Left)   LYMPH NODE DISSECTION N/A 03/09/2020   Procedure: LYMPH NODE DISSECTION;  Surgeon: [name], MD;  Location: [place];  Service: Thoracic;  Laterality: N/A;   polyps of lung  2008   removed by [facility]   TONSILLECTOMY     VIDEO BRONCHOSCOPY N/A 03/09/2020   Procedure: VIDEO BRONCHOSCOPY;  Surgeon: [name], MD;  Location: [place];  Service: Thoracic;  Laterality: N/A;   Current Outpatient Medications on File Prior to Visit  Medication Sig Dispense Refill   carboxymethylcellul-glycerin (OPTIVE) 0.5-0.9 % ophthalmic solution Place 1 drop into both eyes daily as needed for dry eyes. refresh     clopidogrel (PLAVIX) 75 MG tablet Take 1 tablet (75 mg total) by mouth daily. Patient needs to keep upcoming appointment for # 90 supply. 30 tablet 0   Evolocumab (REPATHA SURECLICK) 829 MG/ML SOAJ ADMINISTER 1 ML UNDER THE SKIN EVERY 14 DAYS 2 mL 11   fluticasone (FLONASE) 50 MCG/ACT nasal spray Place 2 sprays into both nostrils daily. (Patient taking differently: Place 2 sprays into both nostrils daily as needed.) 16 g 6   guaiFENesin (MUCINEX) 600 MG 12 hr tablet Take 1 tablet (600 mg total) by mouth 2 (two) times daily as needed for cough or to loosen phlegm. 30 tablet 0   hydrochlorothiazide (HYDRODIURIL) 25 MG tablet Take 1 tablet (25 mg total) by mouth daily as needed (edema). 30 tablet 1   losartan (COZAAR) 100 MG tablet Take 1 tablet (100 mg total) by mouth daily. Patient needs to keep upcoming appointment for # 90 supply. 30 tablet  0   metoprolol succinate (TOPROL-XL) 25 MG 24 hr tablet Take 0.5 tablets (12.5 mg total) by mouth daily. Patient needs to keep upcoming appointment for # 90 supply. 15 tablet 0   nitroGLYCERIN (NITROSTAT) 0.4 MG SL tablet Place 1 tablet (0.4 mg total) under the tongue every 5 (five) minutes as needed for chest pain. 25 tablet 0   PARoxetine (PAXIL) 40 MG tablet TAKE 1 TABLET(40 MG) BY MOUTH DAILY 90 tablet 3   No current facility-administered medications on file prior to visit.   No Known Allergies Social History   Socioeconomic History   Marital status: Married    Spouse name: Not on file   Number of children: Not on file   Years of education: Not on file   Highest education level: Not on file  Occupational History   Occupation: [employer]  Tobacco Use    Smoking status: Former    Packs/day: 0.25    Years: 40.00    Pack years: 10.00    Types: Cigarettes    Start date: 1973   Smokeless tobacco: Never   Tobacco comments:    stopped 03/08/2020  Vaping Use   Vaping Use: Never used  Substance and Sexual Activity   Alcohol use: Yes    Alcohol/week: 0.0 standard drinks    Comment: occasionally   Drug use: No   Sexual activity: Not on file  Other Topics Concern   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   Social Determinants of Health   Financial Resource Strain: Not on file  Food Insecurity: Not on file  Transportation Needs: Not on file  Physical Activity: Not on file  Stress: Not on file  Social Connections: Not on file  Intimate Partner Violence: Not on file       Review of Systems     Objective:   Physical Exam Vitals reviewed.  Constitutional:      General: He is not in acute distress.    Appearance: He is well-developed. He is not diaphoretic.  HENT:     Right Ear: Tympanic membrane and ear canal normal.     Left Ear: Tympanic membrane and ear canal normal.     Nose: Mucosal edema and rhinorrhea present.     Right Turbinates: Swollen.     Left Turbinates: Swollen.     Mouth/Throat:     Pharynx: No oropharyngeal exudate.  Cardiovascular:     Rate and Rhythm: Normal rate and regular rhythm.     Heart sounds: Normal heart sounds. No murmur heard. Pulmonary:     Effort: Pulmonary effort is normal. No respiratory distress.     Breath sounds: Decreased breath sounds and wheezing present. No rales.  Chest:     Chest wall: No tenderness.  Musculoskeletal:     Cervical back: Neck supple.  Lymphadenopathy:     Cervical: No cervical adenopathy.          Assessment & Plan:  Rhinosinusitis I believe this likely started with allergies so we will start him on Xyzal 5 mg daily.  However I believe it is progressed to a sinus infection given the constant dull headache.   Begin Augmentin 875 mg p.o. twice daily for 10 days

## 2021-07-25 DIAGNOSIS — Z08 Encounter for follow-up examination after completed treatment for malignant neoplasm: Secondary | ICD-10-CM | POA: Diagnosis not present

## 2021-07-25 DIAGNOSIS — Z85828 Personal history of other malignant neoplasm of skin: Secondary | ICD-10-CM | POA: Diagnosis not present

## 2021-07-27 ENCOUNTER — Other Ambulatory Visit: Payer: Self-pay | Admitting: Cardiovascular Disease

## 2021-08-23 ENCOUNTER — Other Ambulatory Visit: Payer: Self-pay | Admitting: Cardiovascular Disease

## 2021-09-20 ENCOUNTER — Encounter: Payer: Self-pay | Admitting: Nurse Practitioner

## 2021-09-20 ENCOUNTER — Ambulatory Visit: Payer: BC Managed Care – PPO | Admitting: Nurse Practitioner

## 2021-09-20 ENCOUNTER — Other Ambulatory Visit: Payer: Self-pay | Admitting: Nurse Practitioner

## 2021-09-20 ENCOUNTER — Other Ambulatory Visit: Payer: Self-pay

## 2021-09-20 VITALS — BP 138/77 | HR 69

## 2021-09-20 DIAGNOSIS — R062 Wheezing: Secondary | ICD-10-CM

## 2021-09-20 DIAGNOSIS — J209 Acute bronchitis, unspecified: Secondary | ICD-10-CM

## 2021-09-20 DIAGNOSIS — H6121 Impacted cerumen, right ear: Secondary | ICD-10-CM

## 2021-09-20 DIAGNOSIS — J42 Unspecified chronic bronchitis: Secondary | ICD-10-CM

## 2021-09-20 MED ORDER — ALBUTEROL SULFATE HFA 108 (90 BASE) MCG/ACT IN AERS: 2.0000 | INHALATION_SPRAY | Freq: Four times a day (QID) | RESPIRATORY_TRACT | 0 refills | Status: DC | PRN

## 2021-09-20 MED ORDER — AZITHROMYCIN 250 MG PO TABS: ORAL_TABLET | ORAL | 0 refills | Status: AC

## 2021-09-20 MED ORDER — PREDNISONE 20 MG PO TABS: 40.0000 mg | ORAL_TABLET | Freq: Every day | ORAL | 0 refills | Status: AC

## 2021-09-20 MED ORDER — ALBUTEROL SULFATE (2.5 MG/3ML) 0.083% IN NEBU: 2.5000 mg | INHALATION_SOLUTION | Freq: Once | RESPIRATORY_TRACT | Status: DC

## 2021-09-20 NOTE — Progress Notes (Signed)
Subjective:    Patient ID: [name], male    DOB: [birth year], 66 y.o.   MRN: [id number]  HPI: [name] is a 66 y.o. male presenting for cough   Chief Complaint  Patient presents with   Cough   UPPER RESPIRATORY TRACT INFECTION Onset: 2 weeks ago Fever: no Body aches: no Chills: no Cough: yes; productive and clear/white Shortness of breath: no Wheezing: yes Chest pain: no Chest tightness: no Chest congestion: yes Nasal congestion: yes Runny nose: no Post nasal drip: yes Sneezing: no Sore throat: yes Swollen glands: no Sinus pressure: yes Headache: yes Face pain: no Toothache: no Ear pain: no  Ear pressure:  yes; right ear   Eyes red/itching:no Eye drainage/crusting: no  Nausea: no  Vomiting: no Diarrhea: no  Change in appetite: yes; decreased Loss of taste/smell: no  Rash: no Fatigue: yes Sick contacts: no Strep contacts: no  Context: stable Recurrent sinusitis: no Treatments attempted: Mucinex, Xyzal  Relief with OTC medications: some  No Known Allergies  Outpatient Encounter Medications as of 09/20/2021  Medication Sig   albuterol (VENTOLIN HFA) 108 (90 Base) MCG/ACT inhaler Inhale 2 puffs into the lungs every 6 (six) hours as needed for wheezing or shortness of breath.   azithromycin (ZITHROMAX) 250 MG tablet Take 2 tablets on day 1, then 1 tablet daily on days 2 through 5   carboxymethylcellul-glycerin (OPTIVE) 0.5-0.9 % ophthalmic solution Place 1 drop into both eyes daily as needed for dry eyes. refresh   clopidogrel (PLAVIX) 75 MG tablet TAKE 1 TABLET(75 MG) BY MOUTH DAILY   fluticasone (FLONASE) 50 MCG/ACT nasal spray Place 2 sprays into both nostrils daily. (Patient taking differently: Place 2 sprays into both nostrils daily as needed.)   guaiFENesin (MUCINEX) 600 MG 12 hr tablet Take 1 tablet (600 mg total) by mouth 2 (two) times daily as needed for cough or to loosen phlegm.   hydrochlorothiazide (HYDRODIURIL) 25 MG tablet Take  1 tablet (25 mg total) by mouth daily as needed (edema).   levocetirizine (XYZAL ALLERGY 24HR) 5 MG tablet Take 1 tablet (5 mg total) by mouth every evening.   losartan (COZAAR) 100 MG tablet Take 1 tablet (100 mg total) by mouth daily. Patient needs to keep upcoming appointment for # 90 supply.   metoprolol succinate (TOPROL-XL) 25 MG 24 hr tablet Take 0.5 tablets (12.5 mg total) by mouth daily. Patient needs to keep upcoming appointment for # 90 supply.   nitroGLYCERIN (NITROSTAT) 0.4 MG SL tablet Place 1 tablet (0.4 mg total) under the tongue every 5 (five) minutes as needed for chest pain.   PARoxetine (PAXIL) 40 MG tablet TAKE 1 TABLET(40 MG) BY MOUTH DAILY   predniSONE (DELTASONE) 20 MG tablet Take 2 tablets (40 mg total) by mouth daily with breakfast for 5 days.   REPATHA SURECLICK 812 MG/ML SOAJ ADMINISTER 1 ML UNDER THE SKIN EVERY 14 DAYS   [DISCONTINUED] amoxicillin-clavulanate (AUGMENTIN) 875-125 MG tablet Take 1 tablet by mouth 2 (two) times daily.   Facility-Administered Encounter Medications as of 09/20/2021  Medication   albuterol (PROVENTIL) (2.5 MG/3ML) 0.083% nebulizer solution 2.5 mg    Patient Active Problem List   Diagnosis Date Noted   COVID-19 08/16/2020   S/P robot-assisted surgical procedure 03/09/2020   Vitamin D deficiency 12/27/2015   Essential hypertension 12/21/2015   Varicose veins of left lower extremity with complications 75/17/0017   Varicose veins of right lower extremity with complications 49/44/9675   Varicose veins of bilateral  lower extremities with other complications 67/61/9509   Varicose veins of leg with complications 32/67/1245   Varicose veins of both lower extremities 03/03/2015   Smoker unmotivated to quit    Malaise and fatigue 01/03/2012   Vertigo 01/03/2012   Ischemic heart disease 06/25/2011   Hypercholesterolemia 06/25/2011   Panic attacks    Arthritis of knee    Exogenous obesity    History of sleep apnea    Depression with  anxiety     Past Medical History:  Diagnosis Date   Arthritis of knee    seeing Dr. Alfonso Ramus   Cancer [facility])    Lung   Coronary artery disease    Depression with anxiety    hx of   Exogenous obesity    History of sleep apnea    Hyperlipidemia    Hypertension    Panic attacks    hx of   Sleep apnea    Uses a Cpap   Smoker unmotivated to quit    Varicose veins     Relevant past medical, surgical, family and social history reviewed and updated as indicated. Interim medical history since our last visit reviewed.  Review of Systems Per HPI unless specifically indicated above     Objective:    BP 138/77    Pulse 69    SpO2 95%   Wt Readings from Last 3 Encounters:  07/06/21 298 lb (135.2 kg)  03/17/21 (!) 317 lb (143.8 kg)  10/28/20 (!) 319 lb (144.7 kg)    Physical Exam Vitals and nursing note reviewed.  Constitutional:      General: He is not in acute distress.    Appearance: Normal appearance. He is obese. He is not toxic-appearing.  HENT:     Head: Normocephalic and atraumatic.     Right Ear: There is impacted cerumen.     Left Ear: Tympanic membrane, ear canal and external ear normal. There is impacted cerumen.     Nose: Nose normal. No congestion or rhinorrhea.     Mouth/Throat:     Mouth: Mucous membranes are moist.     Pharynx: Oropharynx is clear. Posterior oropharyngeal erythema present.  Eyes:     General: No scleral icterus.    Extraocular Movements: Extraocular movements intact.  Cardiovascular:     Rate and Rhythm: Normal rate and regular rhythm.     Heart sounds: Normal heart sounds. No murmur heard. Pulmonary:     Effort: Pulmonary effort is normal. No respiratory distress.     Breath sounds: Wheezing and rhonchi present. No rales.  Musculoskeletal:     Cervical back: Normal range of motion.     Right lower leg: No edema.     Left lower leg: No edema.  Skin:    General: Skin is warm and dry.     Coloration: Skin is not jaundiced or pale.      Findings: No erythema.  Neurological:     Mental Status: He is alert and oriented to person, place, and time.      Assessment & Plan:  1. Wheezing Acute.  Albuterol nebulizer given in the office today with improvement in wheezing.  Prescription given for albuterol rescue inhaler to have on hand and use every 6 hours as needed for wheezing or shortness of breath.   - albuterol (PROVENTIL) (2.5 MG/3ML) 0.083% nebulizer solution 2.5 mg  2. Chronic bronchitis with acute exacerbation ([place]) Reports he has never been diagnosed with chronic bronchitis, although given smoking history and recent  chest CT shows emphysema, this is likely a relevant diagnosis.  Start treatment for acute exacerbation with prednisone for wheezing and azithromycin.  SABA given for rescue purposes.  Follow up with no improvement early next week.   - albuterol (VENTOLIN HFA) 108 (90 Base) MCG/ACT inhaler; Inhale 2 puffs into the lungs every 6 (six) hours as needed for wheezing or shortness of breath.  Dispense: 8 g; Refill: 0 - predniSONE (DELTASONE) 20 MG tablet; Take 2 tablets (40 mg total) by mouth daily with breakfast for 5 days.  Dispense: 10 tablet; Refill: 0 - azithromycin (ZITHROMAX) 250 MG tablet; Take 2 tablets on day 1, then 1 tablet daily on days 2 through 5  Dispense: 6 tablet; Refill: 0  3. Impacted cerumen of right ear Ear lavage attempted, small amount cerumen removed, however unable to completely evacuate cerumen.  Encouraged use of Debrox drops at home and can follow up if not better after to have ears flushed again.  - Ear Lavage   Follow up plan: Return if symptoms worsen or fail to improve.

## 2021-09-22 ENCOUNTER — Ambulatory Visit: Payer: BC Managed Care – PPO | Admitting: Nurse Practitioner

## 2021-10-09 ENCOUNTER — Other Ambulatory Visit: Payer: Self-pay | Admitting: *Deleted

## 2021-10-09 DIAGNOSIS — Z85118 Personal history of other malignant neoplasm of bronchus and lung: Secondary | ICD-10-CM

## 2021-10-10 ENCOUNTER — Other Ambulatory Visit: Payer: Self-pay | Admitting: Nurse Practitioner

## 2021-10-10 DIAGNOSIS — J209 Acute bronchitis, unspecified: Secondary | ICD-10-CM

## 2021-11-10 ENCOUNTER — Ambulatory Visit
Admission: RE | Admit: 2021-11-10 | Discharge: 2021-11-10 | Disposition: A | Discharge status: Home / Self Care | Payer: BC Managed Care – PPO | Source: Ambulatory Visit | Attending: Thoracic Surgery (Cardiothoracic Vascular Surgery) | Admitting: Thoracic Surgery (Cardiothoracic Vascular Surgery)

## 2021-11-10 ENCOUNTER — Ambulatory Visit: Payer: BC Managed Care – PPO | Admitting: Thoracic Surgery (Cardiothoracic Vascular Surgery)

## 2021-11-10 ENCOUNTER — Other Ambulatory Visit: Payer: Self-pay

## 2021-11-10 DIAGNOSIS — Z85118 Personal history of other malignant neoplasm of bronchus and lung: Secondary | ICD-10-CM

## 2021-11-10 NOTE — Progress Notes (Signed)
?   ?  [name] [address] ?      [name] [address] ?            [phone]      ? ?Patient: Home ?Provider: Office ?Consent for Telemedicine visit obtained. ? ?Today?s visit was completed via a real-time telehealth (see specific modality noted below). The patient/authorized person provided oral consent at the time of the visit to engage in a telemedicine encounter with the present provider at [facility]. The patient/authorized person was informed of the potential benefits, limitations, and risks of telemedicine. The patient/authorized person expressed understanding that the laws that protect confidentiality also apply to telemedicine. The patient/authorized person acknowledged understanding that telemedicine does not provide emergency services and that he or she would need to call 911 or proceed to the nearest hospital for help if such a need arose. ? ? Total time spent in the clinical discussion 10 minutes. ? Telehealth Modality: Phone visit (audio only) ? 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  Overall he is doing well.  I personally reviewed his CXR, and there is no evidence of any new or recurrent disease.  I will see him again in 12 months with a CT chest. ? ?

## 2021-11-15 ENCOUNTER — Other Ambulatory Visit: Payer: Self-pay | Admitting: Cardiovascular Disease

## 2021-11-22 DIAGNOSIS — M170 Bilateral primary osteoarthritis of knee: Secondary | ICD-10-CM | POA: Diagnosis not present

## 2021-11-29 ENCOUNTER — Other Ambulatory Visit: Payer: Self-pay | Admitting: Family Medicine

## 2021-11-29 DIAGNOSIS — F410 Panic disorder [episodic paroxysmal anxiety] without agoraphobia: Secondary | ICD-10-CM

## 2022-02-02 DIAGNOSIS — M170 Bilateral primary osteoarthritis of knee: Secondary | ICD-10-CM | POA: Diagnosis not present

## 2022-03-13 DIAGNOSIS — B078 Other viral warts: Secondary | ICD-10-CM | POA: Diagnosis not present

## 2022-03-13 DIAGNOSIS — C44529 Squamous cell carcinoma of skin of other part of trunk: Secondary | ICD-10-CM | POA: Diagnosis not present

## 2022-04-09 IMAGING — PT NM PET TUM IMG INITIAL (PI) SKULL BASE T - THIGH
7 series · 25 of 25 positions shown · non-contrast
Comparison: Low-dose lung cancer screening CT chest dated
01/13/2020

CLINICAL DATA: Initial treatment strategy for left lower lobe
pulmonary nodule.

EXAM:
NUCLEAR MEDICINE PET SKULL BASE TO THIGH
TECHNIQUE: 15.2 mCi F-18 FDG was injected intravenously. Full-ring PET imaging
was performed from the skull base to thigh after the radiotracer. CT
data was obtained and used for attenuation correction and anatomic
localization.
Fasting blood glucose: 97 mg/dl

[Series 3: pet sk_thigh ac · axial · 5.0mm · 4.07mm/px · z∈[-938,+74]mm · 6 of 254 slices shown]
[im 1/254]
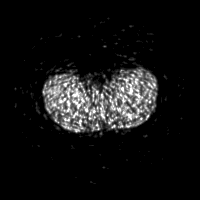
[im 51/254]
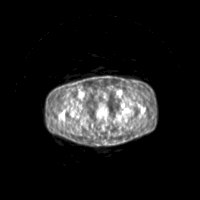
[im 102/254]
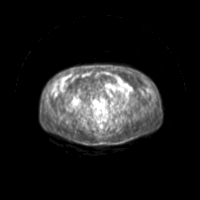
[im 152/254]
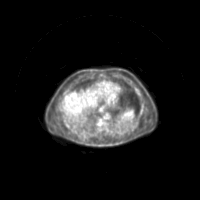
[im 203/254]
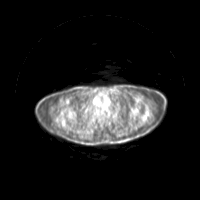
[im 254/254]
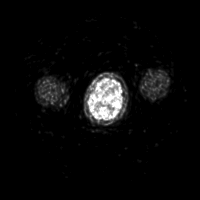

[Series 4: ct sk_thigh 5.0 b31f · axial · 5.0mm · 0.98mm/px · z∈[-938,+74]mm · 6 of 254 slices shown]
[im 1/254]
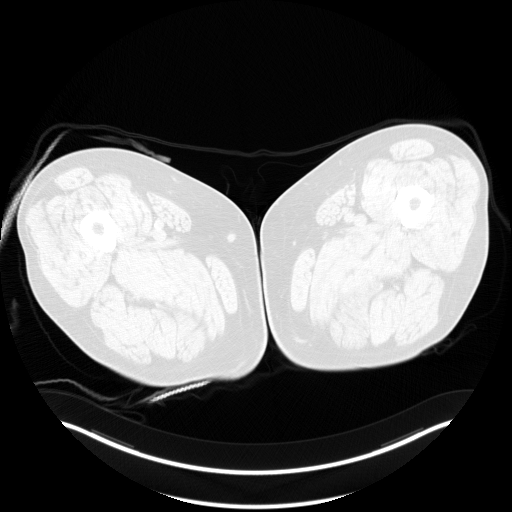
[im 51/254]
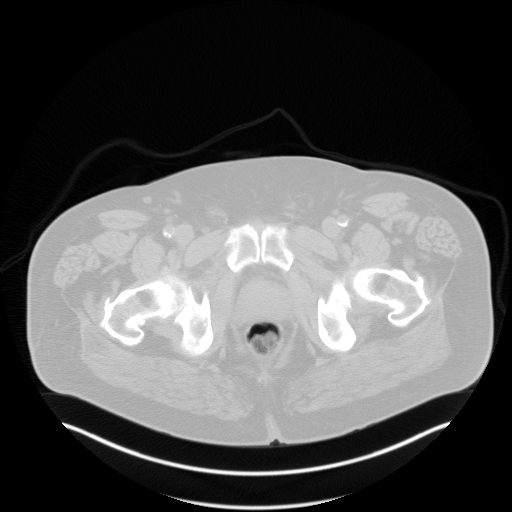
[im 102/254]
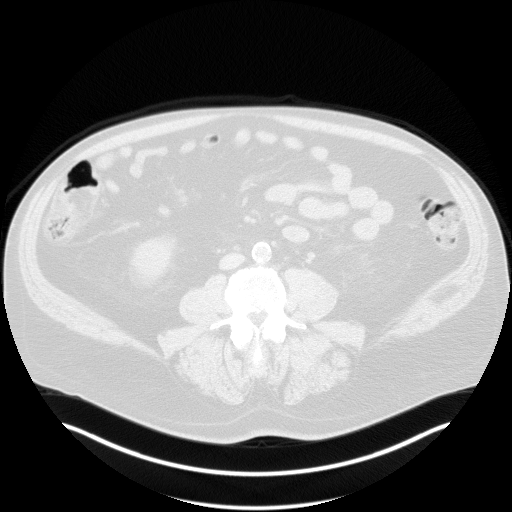
[im 152/254]
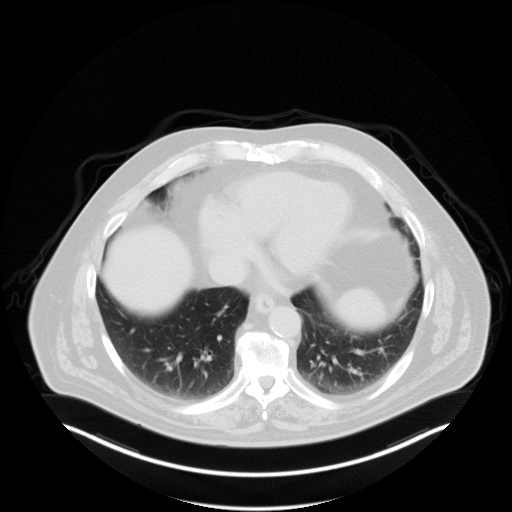
[im 203/254]
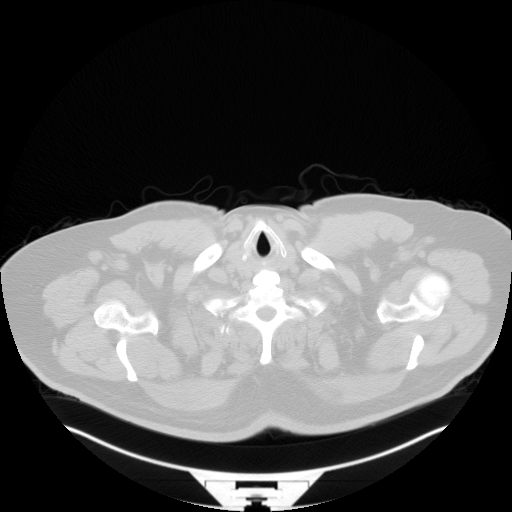
[im 254/254  brain]
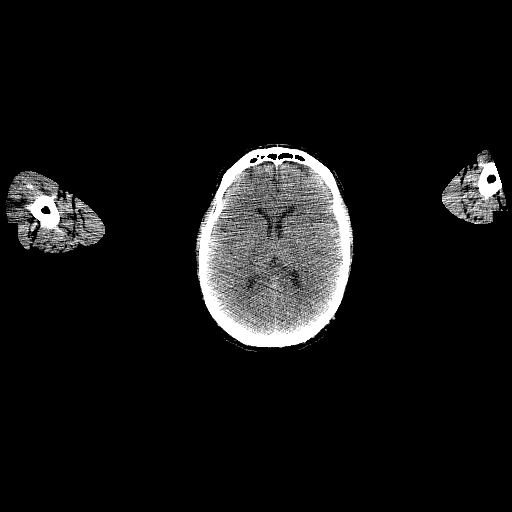

[Series 5: pet sk_thigh nac · axial · 5.0mm · 4.07mm/px · z∈[-938,+74]mm · 5 of 254 slices shown]
[im 1/254]
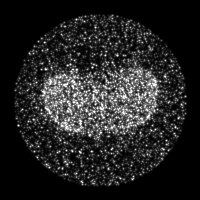
[im 64/254]
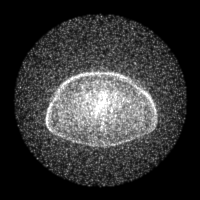
[im 127/254]
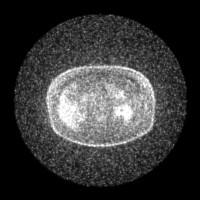
[im 190/254]
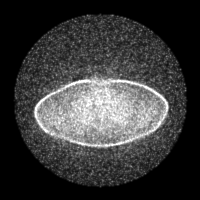
[im 254/254]
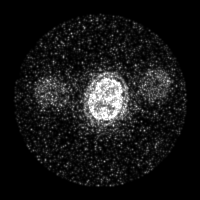

[Series 8: ct sk_thigh 5.0 b70f (id)_bone · axial · 5.0mm · 0.80mm/px · 1 of 65 slices shown]
[im 1/65  bone]
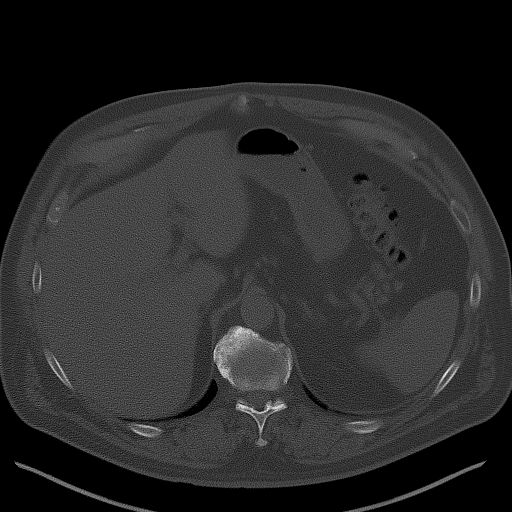

[Series 603: mip range 3 · coronal · 2.10mm/px · 1 of 32 slices shown]
[im 1/32]
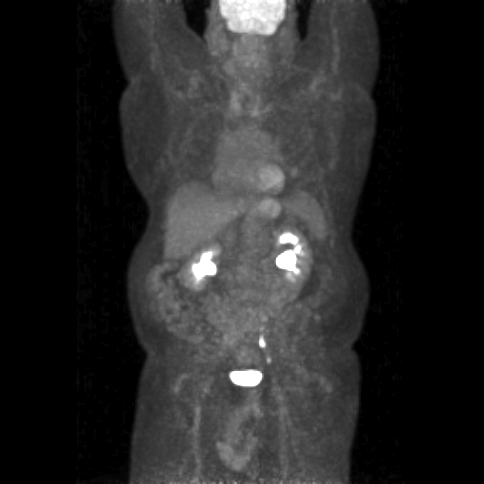

[Series 605: range-ct sk_thigh 5.0 (id)<alpha range(1)> · 1 of 65 slices shown]
[im 1/65]
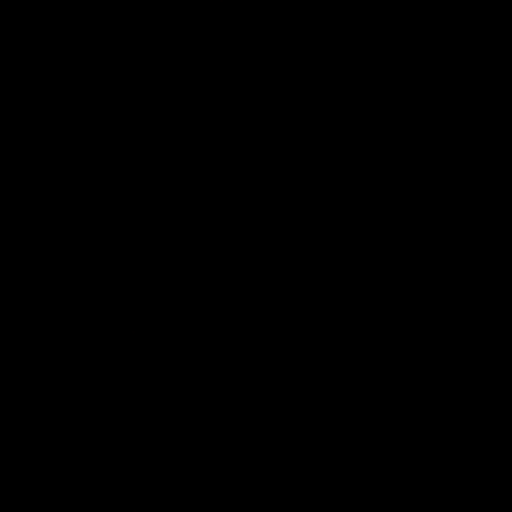

[Series 606: range-ct sk_thigh 5.022 (id)<alpha range> · 5 of 244 slices shown]
[im 1/244]
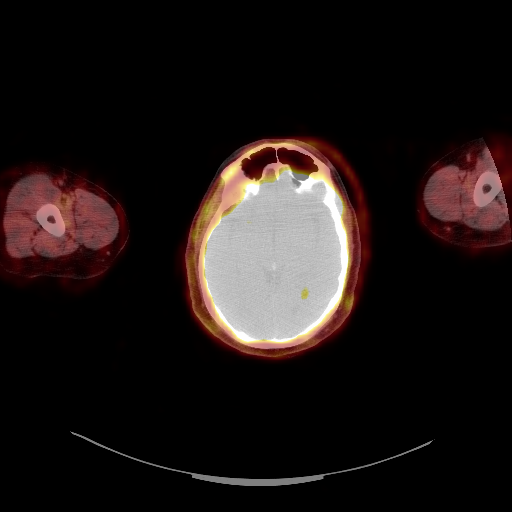
[im 61/244]
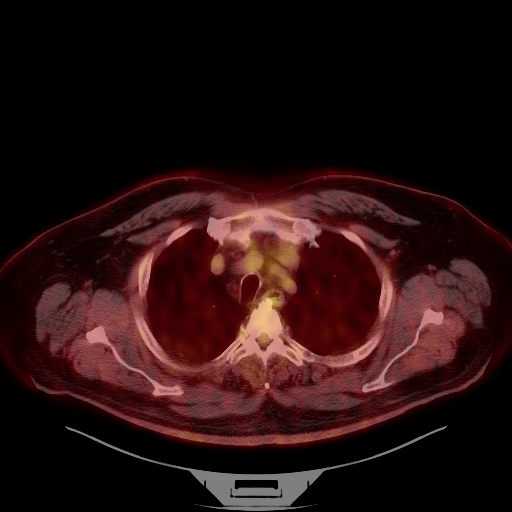
[im 122/244]
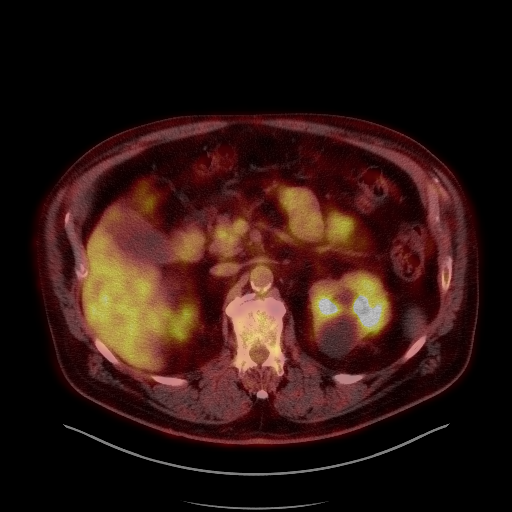
[im 183/244]
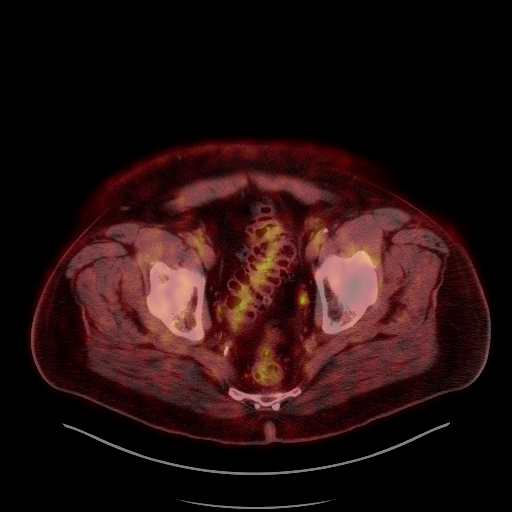
[im 244/244]
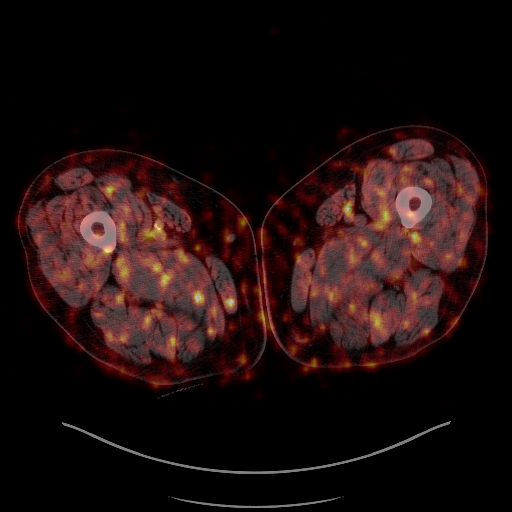

[25 of 25 positions shown; findings below may reference images not displayed]

FINDINGS: Mediastinal blood pool activity: SUV max

Liver activity: SUV max NA

NECK: No hypermetabolic cervical lymphadenopathy.

Incidental CT findings: none

CHEST: 10 x 16 mm thick-walled cavitary lesion in the posterior left
bronchogenic neoplasm such as squamous cell carcinoma.

Mild paraseptal emphysematous changes at the lung apices.

No hypermetabolic thoracic lymphadenopathy.

Incidental CT findings: Atherosclerotic calcifications of the aortic
arch. 3 vessel coronary atherosclerosis.

ABDOMEN/PELVIS: No abnormal hypermetabolism in the liver, spleen,
pancreas, or adrenal glands.

No hypermetabolic abdominopelvic lymphadenopathy.

Incidental CT findings: Left renal cysts. Atherosclerotic
calcifications the abdominal aorta and branch vessels. Sigmoid
diverticulosis, without evidence of diverticulitis.

SKELETON: No focal hypermetabolic activity to suggest skeletal
metastasis.

Incidental CT findings: Degenerative changes of the visualized
thoracolumbar spine.
IMPRESSION: 10 x 16 mm hypermetabolic thick-walled cavitary lesion in the
posterior left lower lobe, suspicious for primary bronchogenic
neoplasm such as squamous cell carcinoma.

No findings suspicious for metastatic disease.

## 2022-04-24 ENCOUNTER — Other Ambulatory Visit: Payer: Self-pay | Admitting: Cardiovascular Disease

## 2022-05-14 ENCOUNTER — Ambulatory Visit: Payer: BC Managed Care – PPO | Admitting: Family Medicine

## 2022-05-14 ENCOUNTER — Encounter: Payer: Self-pay | Admitting: Family Medicine

## 2022-05-14 VITALS — BP 150/68 | HR 65 | Temp 98.0°F | Ht 73.0 in | Wt 319.0 lb

## 2022-05-14 DIAGNOSIS — J310 Chronic rhinitis: Secondary | ICD-10-CM | POA: Diagnosis not present

## 2022-05-14 DIAGNOSIS — J329 Chronic sinusitis, unspecified: Secondary | ICD-10-CM

## 2022-05-14 DIAGNOSIS — Z23 Encounter for immunization: Secondary | ICD-10-CM | POA: Diagnosis not present

## 2022-05-14 MED ORDER — AMOXICILLIN-POT CLAVULANATE 875-125 MG PO TABS: 1.0000 | ORAL_TABLET | Freq: Two times a day (BID) | ORAL | 0 refills | Status: DC

## 2022-05-14 NOTE — Progress Notes (Signed)
   Subjective:    Patient ID: [name], male    DOB: [birth year], 66 y.o.   MRN: [id number]  Sinusitis This is a new problem. The current episode started 1 to 4 weeks ago. The problem has been gradually worsening since onset. There has been no fever. His pain is at a severity of 7/10. Associated symptoms include congestion, coughing, headaches, sinus pressure, sneezing and a sore throat. Pertinent negatives include no chills, ear pain or shortness of breath. Past treatments include oral decongestants. The treatment provided mild relief.   Review of Systems  Constitutional:  Negative for chills.  HENT:  Positive for congestion, sinus pressure, sneezing and sore throat. Negative for ear pain.   Respiratory:  Positive for cough. Negative for shortness of breath.   Neurological:  Positive for headaches.       Objective:   Physical Exam Vitals and nursing note reviewed.  Constitutional:      Appearance: Normal appearance.  HENT:     Right Ear: Ear canal and external ear normal. There is impacted cerumen.     Left Ear: Tympanic membrane, ear canal and external ear normal.     Nose: Nose normal.     Mouth/Throat:     Pharynx: Posterior oropharyngeal erythema present.  Eyes:     Conjunctiva/sclera: Conjunctivae normal.  Cardiovascular:     Rate and Rhythm: Normal rate and regular rhythm.     Pulses: Normal pulses.     Heart sounds: Normal heart sounds.  Pulmonary:     Effort: Pulmonary effort is normal.     Breath sounds: Normal breath sounds.  Musculoskeletal:     Cervical back: Normal range of motion and neck supple.  Neurological:     Mental Status: He is alert.           Assessment & Plan:   Rhinosinusitis - Plan: amoxicillin-clavulanate (AUGMENTIN) 875-125 MG tablet Given the progression and duration of his symptoms I will prescribe antibiotic treatment. Instructed to call back if symptoms worsen or persist and seek care for fevers, malaise, shortness of  breath. May restart his Xyzal OTC and Mucinex or Delsym OTC for symptomatic control. Push fluids.

## 2022-05-14 NOTE — Addendum Note (Signed)
Addended by: Colman Cater on: 05/14/2022 02:43 PM   Modules accepted: Orders

## 2022-05-15 DIAGNOSIS — B078 Other viral warts: Secondary | ICD-10-CM | POA: Diagnosis not present

## 2022-05-15 DIAGNOSIS — Z85828 Personal history of other malignant neoplasm of skin: Secondary | ICD-10-CM | POA: Diagnosis not present

## 2022-05-15 DIAGNOSIS — Z08 Encounter for follow-up examination after completed treatment for malignant neoplasm: Secondary | ICD-10-CM | POA: Diagnosis not present

## 2022-05-16 DIAGNOSIS — M170 Bilateral primary osteoarthritis of knee: Secondary | ICD-10-CM | POA: Diagnosis not present

## 2022-05-17 NOTE — Progress Notes (Signed)
Date:  05/30/2022   ID:  [name], [place] 28-Aug-1955, MRN [id number]  PCP:  [name], MD  Cardiologist:  [name], MD   Electrophysiologist:  None   Evaluation Performed:  Follow-Up Visit  Chief Complaint:  CAD/ HLD   History of Present Illness:    66 y.o. 2010 had stenting proximal and mid LAD with DES by [name]. Non ischemic myovue 04/24/16 Had myalgias on crestor and lipitor Now on Repatha  Varicose veins post ablative Rx by [name]. 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 Smoker over 76 pack year not interested in Chantix   Some LE edema Has PRN HCTZ  Helping to raise 57 yo grandson   Has some sciatica Rx with prednisone improved   Last visit we ordered lung cancer screening CT which identified LLL cancer He is no post LLL lobectomy by Dr [name] Stage 1A no metastatic disease   Wearing CPAP Supposed to have swallow study for dysphagia   No angina still working with [name] at [name] by Primary 05/14/22 started on Augmentin for sinusitis   LE edema and never saw vein doctors Likely needs diuretic for edema to prevent venous ulcers   Past Medical History:  Diagnosis Date   Arthritis of knee    seeing Dr. Alfonso Ramus   Cancer [facility])    Lung   Coronary artery disease    Depression with anxiety    hx of   Exogenous obesity    History of sleep apnea    Hyperlipidemia    Hypertension    Panic attacks    hx of   Sleep apnea    Uses a Cpap   Smoker unmotivated to quit    Varicose veins    Past Surgical History:  Procedure Laterality Date   CARDIAC CATHETERIZATION  09/06/2008   stent to mid LAD and proximal LAD   COLONOSCOPY N/A 05/01/2013   Procedure: COLONOSCOPY;  Surgeon: [name], MD;  [facility];  Service: Endoscopy;  Laterality: N/A;  8:30 AM   EYE SURGERY  2006   Lasik on both eyes   INTERCOSTAL NERVE BLOCK Left 03/09/2020   Procedure: INTERCOSTAL NERVE BLOCK;  Surgeon: [name], MD;   Location: [place];  Service: Thoracic;  Laterality: Left;   LOBECTOMY  03/09/2020   XI ROBOTIC ASSISTED THORASCOPY - LOWER LEFT LOBE (LLL) WEDGE RESECTION AND LLL LOBECTOMY (Left)   LYMPH NODE DISSECTION N/A 03/09/2020   Procedure: LYMPH NODE DISSECTION;  Surgeon: [name], MD;  Location: [place];  Service: Thoracic;  Laterality: N/A;   polyps of lung  2008   removed by [name]   TONSILLECTOMY     VIDEO BRONCHOSCOPY N/A 03/09/2020   Procedure: VIDEO BRONCHOSCOPY;  Surgeon: [name], MD;  Location: [place];  Service: Thoracic;  Laterality: N/A;     Current Meds  Medication Sig   albuterol (VENTOLIN HFA) 108 (90 Base) MCG/ACT inhaler Inhale 2 puffs into the lungs every 6 (six) hours as needed for wheezing or shortness of breath.   amoxicillin-clavulanate (AUGMENTIN) 875-125 MG tablet Take 1 tablet by mouth 2 (two) times daily.   carboxymethylcellul-glycerin (OPTIVE) 0.5-0.9 % ophthalmic solution Place 1 drop into both eyes daily as needed for dry eyes. refresh   clopidogrel (PLAVIX) 75 MG tablet TAKE 1 TABLET(75 MG) BY MOUTH DAILY   fluticasone (FLONASE) 50 MCG/ACT nasal spray Place 2 sprays into both nostrils daily. (  Patient taking differently: Place 2 sprays into both nostrils daily as needed.)   furosemide (LASIX) 20 MG tablet Take 1 tablet (20 mg total) by mouth daily as needed (shortness of breath).   guaiFENesin (MUCINEX) 600 MG 12 hr tablet Take 1 tablet (600 mg total) by mouth 2 (two) times daily as needed for cough or to loosen phlegm.   hydrochlorothiazide (HYDRODIURIL) 25 MG tablet Take 1 tablet (25 mg total) by mouth daily as needed (edema).   levocetirizine (XYZAL ALLERGY 24HR) 5 MG tablet Take 1 tablet (5 mg total) by mouth every evening.   losartan (COZAAR) 100 MG tablet TAKE 1 TABLET(100 MG) BY MOUTH DAILY   metoprolol succinate (TOPROL-XL) 25 MG 24 hr tablet TAKE 1/2 TABLET(12.5 MG) BY MOUTH DAILY   nitroGLYCERIN (NITROSTAT) 0.4 MG SL tablet Place 1  tablet (0.4 mg total) under the tongue every 5 (five) minutes as needed for chest pain.   PARoxetine (PAXIL) 40 MG tablet TAKE 1 TABLET(40 MG) BY MOUTH DAILY   REPATHA SURECLICK 347 MG/ML SOAJ ADMINISTER 1 ML UNDER THE SKIN EVERY 14 DAYS   Current Facility-Administered Medications for the 05/30/22 encounter (Office Visit) with Josue Hector, MD  Medication   albuterol (PROVENTIL) (2.5 MG/3ML) 0.083% nebulizer solution 2.5 mg     Allergies:   Patient has no known allergies.   Social History   Tobacco Use   Smoking status: Every Day    Packs/day: 0.50    Years: 40.00    Total pack years: 20.00    Types: Cigarettes    Start date: 33   Smokeless tobacco: Never  Vaping Use   Vaping Use: Never used  Substance Use Topics   Alcohol use: Yes    Alcohol/week: 0.0 standard drinks of alcohol    Comment: occasionally   Drug use: No     Family Hx: The patient's family history includes Esophageal cancer (age of onset: 17) in his father; Heart disease in his father; Lung cancer in his brother. There is no history of Colon cancer.  ROS:   Please see the history of present illness.     All other systems reviewed and are negative.   Prior CV studies:   The following studies were reviewed today:  Myovue 04/24/16  Labs/Other Tests and Data Reviewed:    EKG:  10/31/17 SR rate 68 normal  12/22/19 SR rate 59 normal 05/30/2022 SR normal rate 68  Recent Labs: No results found for requested labs within last 365 days.   Recent Lipid Panel Lab Results  Component Value Date/Time   CHOL 181 09/19/2020 08:37 AM   CHOL 151 12/31/2017 08:20 AM   TRIG 124 09/19/2020 08:37 AM   HDL 35 (L) 09/19/2020 08:37 AM   HDL 31 (L) 12/31/2017 08:20 AM   CHOLHDL 5.2 (H) 09/19/2020 08:37 AM   LDLCALC 123 (H) 09/19/2020 08:37 AM   LDLDIRECT 178 (H) 11/18/2017 04:28 PM    Wt Readings from Last 3 Encounters:  05/30/22 (!) 314 lb (142.4 kg)  05/14/22 (!) 319 lb (144.7 kg)  07/06/21 298 lb (135.2 kg)      Objective:    Vital Signs:  BP (!) 130/58   Pulse 61   Ht 6\' 1"  (1.854 m)   Wt (!) 314 lb (142.4 kg)   SpO2 94%   BMI 41.43 kg/m    Affect appropriate Obese white male  HEENT: normal Neck supple with no adenopathy JVP normal no bruits no thyromegaly Lungs clear with no wheezing and  good diaphragmatic motionpost LLL lobectomy  S1/S2 no murmur, no rub, gallop or click PMI normal Abdomen: benighn, BS positve, no tenderness, no AAA no bruit.  No HSM or HJR Distal pulses intact with no bruits LE varicosities right >left with edema and stasis  Neuro non-focal Skin warm and dry No muscular weakness   ASSESSMENT & PLAN:    1. CAD: stenting LAD 2010 non ischemic myovue 2017 no chest pain continue medical Rx 2. HLD:  LDL 79 continue Repatha   3. Obesity:  Discussed low carb diet and portion control  4.  Bilateral varicose veins.  F/U [facility] post ablative Rx with chronic stasis referred back to [facility] specialist  PRN Lasix 20 mg called  5. Ortho: post arthroscopic surgery on left with improvement f/u Dr Alain Marion  6. Smoking:  lung cancer CT 01/13/20 with 8 mm lesion LLL, positive PET now post LLL lobectomy 03/09/20  f/u pulmonary and oncology CXR 11/10/21 NAD  7. Sinusitis:  post course of Augmentin improving   Medication Adjustments/Labs and Tests Ordered: Current medicines are reviewed at length with the patient today.  Concerns regarding medicines are outlined above.   Tests Ordered:  None   Medication Changes: Meds ordered this encounter  Medications   furosemide (LASIX) 20 MG tablet    Sig: Take 1 tablet (20 mg total) by mouth daily as needed (shortness of breath).    Dispense:  30 tablet    Refill:  11     Disposition:  Follow up in a year   Signed, [name], MD  05/30/2022 9:19 AM    [place]

## 2022-05-30 ENCOUNTER — Encounter: Payer: Self-pay | Admitting: Cardiovascular Disease

## 2022-05-30 ENCOUNTER — Ambulatory Visit: Payer: BC Managed Care – PPO | Attending: Cardiovascular Disease | Admitting: Cardiovascular Disease

## 2022-05-30 VITALS — BP 130/58 | HR 61 | Ht 73.0 in | Wt 314.0 lb

## 2022-05-30 DIAGNOSIS — I8393 Asymptomatic varicose veins of bilateral lower extremities: Secondary | ICD-10-CM | POA: Diagnosis not present

## 2022-05-30 DIAGNOSIS — I2510 Atherosclerotic heart disease of native coronary artery without angina pectoris: Secondary | ICD-10-CM

## 2022-05-30 DIAGNOSIS — E785 Hyperlipidemia, unspecified: Secondary | ICD-10-CM | POA: Diagnosis not present

## 2022-05-30 DIAGNOSIS — C3432 Malignant neoplasm of lower lobe, left bronchus or lung: Secondary | ICD-10-CM | POA: Diagnosis not present

## 2022-05-30 DIAGNOSIS — J329 Chronic sinusitis, unspecified: Secondary | ICD-10-CM

## 2022-05-30 MED ORDER — FUROSEMIDE 20 MG PO TABS: 20.0000 mg | ORAL_TABLET | Freq: Every day | ORAL | 11 refills | Status: DC | PRN

## 2022-05-30 NOTE — Addendum Note (Signed)
Addended by: Sharee Holster R on: 05/30/2022 02:19 PM   Modules accepted: Orders

## 2022-05-30 NOTE — Patient Instructions (Signed)
Medication Instructions:  Your physician has recommended you make the following change in your medication:  1-TAKE Furosemide 20 mg by mouth daily as needed for shortness of breath  *If you need a refill on your cardiac medications before your next appointment, please call your pharmacy*  Lab Work: If you have labs (blood work) drawn today and your tests are completely normal, you will receive your results only by: [place] (if you have MyChart) OR A paper copy in the mail If you have any lab test that is abnormal or we need to change your treatment, we will call you to review the results.  Testing/Procedures: None ordered today.  Follow-Up: At [facility], you and your health needs are our priority.  As part of our continuing mission to provide you with exceptional heart care, we have created designated Provider Care Teams.  These Care Teams include your primary Cardiologist (physician) and Advanced Practice Providers (APPs -  Physician Assistants and Nurse Practitioners) who all work together to provide you with the care you need, when you need it.  We recommend signing up for the patient portal called "MyChart".  Sign up information is provided on this After Visit Summary.  MyChart is used to connect with patients for Virtual Visits (Telemedicine).  Patients are able to view lab/test results, encounter notes, upcoming appointments, etc.  Non-urgent messages can be sent to your provider as well.   To learn more about what you can do with MyChart, go to [email or web address].    Your next appointment:   1 year(s)  The format for your next appointment:   In Person  Provider:   [name], MD      Important Information About Sugar

## 2022-06-15 ENCOUNTER — Ambulatory Visit (INDEPENDENT_AMBULATORY_CARE_PROVIDER_SITE_OTHER): Payer: BC Managed Care – PPO | Admitting: Family Medicine

## 2022-06-15 VITALS — BP 124/72 | HR 85 | Ht 73.0 in | Wt 315.0 lb

## 2022-06-15 DIAGNOSIS — Z85118 Personal history of other malignant neoplasm of bronchus and lung: Secondary | ICD-10-CM | POA: Diagnosis not present

## 2022-06-15 DIAGNOSIS — E7800 Pure hypercholesterolemia, unspecified: Secondary | ICD-10-CM | POA: Diagnosis not present

## 2022-06-15 DIAGNOSIS — F17200 Nicotine dependence, unspecified, uncomplicated: Secondary | ICD-10-CM

## 2022-06-15 DIAGNOSIS — I10 Essential (primary) hypertension: Secondary | ICD-10-CM

## 2022-06-15 DIAGNOSIS — Z0000 Encounter for general adult medical examination without abnormal findings: Secondary | ICD-10-CM

## 2022-06-15 DIAGNOSIS — Z125 Encounter for screening for malignant neoplasm of prostate: Secondary | ICD-10-CM

## 2022-06-15 DIAGNOSIS — I2510 Atherosclerotic heart disease of native coronary artery without angina pectoris: Secondary | ICD-10-CM

## 2022-06-15 DIAGNOSIS — Z1211 Encounter for screening for malignant neoplasm of colon: Secondary | ICD-10-CM

## 2022-06-15 DIAGNOSIS — Z9889 Other specified postprocedural states: Secondary | ICD-10-CM | POA: Diagnosis not present

## 2022-06-15 NOTE — Progress Notes (Signed)
Subjective:    Patient ID: [name], male    DOB: [birth year], 66 y.o.   MRN: [id number]  HPI  Patient is a pleasant 66 y/o WM here today for complete physical exam.  Has a history of lung cancer s/p LLL lobectomy for stage 1A.  History of CAD with stenting in the mid LAD.  Also has a history of OSA with AHI 43.  He is due for a flu shot.  He politely declines this.  He has had Prevnar 20 and Pneumovax 23.  He is due for Shingrix.  He is due for COVID booster.  We discussed Shingrix and a COVID booster as well as RSV.  Tetanus shot is up-to-date.  Patient will consider the vaccines but declines them at the present time.  He is due for repeat CT scan of his chest to monitor for any recurrence of his lung cancer.  Dr. [name] recommended a CT scan in 6 months.  This was at the March 2022 office visit.  Therefore he appears to be overdue for this.  I would like to schedule this for him today.  Unfortunately he continues to smoke.  He declines Chantix.  He is in the precontemplative phase.  He is due for colon cancer screening.  We discussed colonoscopy versus Cologuard and he elects Cologuard. Past Medical History:  Diagnosis Date   Arthritis of knee    seeing Dr. Alfonso Ramus   Cancer [facility])    Lung   Coronary artery disease    Depression with anxiety    hx of   Exogenous obesity    History of sleep apnea    Hyperlipidemia    Hypertension    Panic attacks    hx of   Sleep apnea    Uses a Cpap   Smoker unmotivated to quit    Varicose veins    Past Surgical History:  Procedure Laterality Date   CARDIAC CATHETERIZATION  09/06/2008   stent to mid LAD and proximal LAD   COLONOSCOPY N/A 05/01/2013   Procedure: COLONOSCOPY;  Surgeon: [name], MD;  [facility];  Service: Endoscopy;  Laterality: N/A;  8:30 AM   EYE SURGERY  2006   Lasik on both eyes   INTERCOSTAL NERVE BLOCK Left 03/09/2020   Procedure: INTERCOSTAL NERVE BLOCK;  Surgeon: [name], MD;   Location: [place];  Service: Thoracic;  Laterality: Left;   LOBECTOMY  03/09/2020   XI ROBOTIC ASSISTED THORASCOPY - LOWER LEFT LOBE (LLL) WEDGE RESECTION AND LLL LOBECTOMY (Left)   LYMPH NODE DISSECTION N/A 03/09/2020   Procedure: LYMPH NODE DISSECTION;  Surgeon: [name], MD;  Location: [place];  Service: Thoracic;  Laterality: N/A;   polyps of lung  2008   removed by [facility]   TONSILLECTOMY     VIDEO BRONCHOSCOPY N/A 03/09/2020   Procedure: VIDEO BRONCHOSCOPY;  Surgeon: [name], MD;  Location: [place];  Service: Thoracic;  Laterality: N/A;   Current Outpatient Medications on File Prior to Visit  Medication Sig Dispense Refill   albuterol (VENTOLIN HFA) 108 (90 Base) MCG/ACT inhaler Inhale 2 puffs into the lungs every 6 (six) hours as needed for wheezing or shortness of breath. 8 g 0   amoxicillin-clavulanate (AUGMENTIN) 875-125 MG tablet Take 1 tablet by mouth 2 (two) times daily. 20 tablet 0   carboxymethylcellul-glycerin (OPTIVE) 0.5-0.9 % ophthalmic solution Place 1 drop into both eyes daily as needed for dry eyes. refresh  clopidogrel (PLAVIX) 75 MG tablet TAKE 1 TABLET(75 MG) BY MOUTH DAILY 30 tablet 7   fluticasone (FLONASE) 50 MCG/ACT nasal spray Place 2 sprays into both nostrils daily. (Patient taking differently: Place 2 sprays into both nostrils daily as needed.) 16 g 6   furosemide (LASIX) 20 MG tablet Take 1 tablet (20 mg total) by mouth daily as needed (shortness of breath). 30 tablet 11   guaiFENesin (MUCINEX) 600 MG 12 hr tablet Take 1 tablet (600 mg total) by mouth 2 (two) times daily as needed for cough or to loosen phlegm. 30 tablet 0   hydrochlorothiazide (HYDRODIURIL) 25 MG tablet Take 1 tablet (25 mg total) by mouth daily as needed (edema). 30 tablet 1   levocetirizine (XYZAL ALLERGY 24HR) 5 MG tablet Take 1 tablet (5 mg total) by mouth every evening. 30 tablet 3   losartan (COZAAR) 100 MG tablet TAKE 1 TABLET(100 MG) BY MOUTH DAILY 90  tablet 2   metoprolol succinate (TOPROL-XL) 25 MG 24 hr tablet TAKE 1/2 TABLET(12.5 MG) BY MOUTH DAILY 15 tablet 3   nitroGLYCERIN (NITROSTAT) 0.4 MG SL tablet Place 1 tablet (0.4 mg total) under the tongue every 5 (five) minutes as needed for chest pain. 25 tablet 0   PARoxetine (PAXIL) 40 MG tablet TAKE 1 TABLET(40 MG) BY MOUTH DAILY 90 tablet 3   REPATHA SURECLICK 440 MG/ML SOAJ ADMINISTER 1 ML UNDER THE SKIN EVERY 14 DAYS 2 mL 11   Current Facility-Administered Medications on File Prior to Visit  Medication Dose Route Frequency Provider Last Rate Last Admin   albuterol (PROVENTIL) (2.5 MG/3ML) 0.083% nebulizer solution 2.5 mg  2.5 mg Nebulization Once Eulogio Bear, NP       No Known Allergies Social History   Socioeconomic History   Marital status: Married    Spouse name: Not on file   Number of children: Not on file   Years of education: Not on file   Highest education level: Not on file  Occupational History   Occupation: [employer]  Tobacco Use   Smoking status: Every Day    Packs/day: 0.50    Years: 40.00    Total pack years: 20.00    Types: Cigarettes    Start date: 1973   Smokeless tobacco: Never  Vaping Use   Vaping Use: Never used  Substance and Sexual Activity   Alcohol use: Yes    Alcohol/week: 0.0 standard drinks of alcohol    Comment: occasionally   Drug use: No   Sexual activity: Not on file  Other Topics Concern   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   Social Determinants of Health   Financial Resource Strain: Not on file  Food Insecurity: Not on file  Transportation Needs: Not on file  Physical Activity: Not on file  Stress: Not on file  Social Connections: Not on file  Intimate Partner Violence: Not on file    Family History  Problem Relation Age of Onset   Heart disease Father        had cabg   Esophageal cancer Father 79       esophageal   Lung cancer Brother        lung cancer    Colon cancer Neg Hx       Review of Systems     Objective:   Physical Exam Vitals reviewed.  Constitutional:      General: He is not in  acute distress.    Appearance: He is well-developed. He is not diaphoretic.  HENT:     Head: Normocephalic and atraumatic.     Right Ear: Tympanic membrane, ear canal and external ear normal.     Left Ear: Tympanic membrane, ear canal and external ear normal.     Nose: Nose normal.     Mouth/Throat:     Pharynx: No oropharyngeal exudate.  Eyes:     General: No scleral icterus.       Right eye: No discharge.        Left eye: No discharge.     Conjunctiva/sclera: Conjunctivae normal.     Pupils: Pupils are equal, round, and reactive to light.  Neck:     Thyroid: No thyromegaly.     Vascular: No JVD.     Trachea: No tracheal deviation.  Cardiovascular:     Rate and Rhythm: Normal rate and regular rhythm.     Heart sounds: Normal heart sounds. No murmur heard.    No friction rub. No gallop.  Pulmonary:     Effort: Pulmonary effort is normal. No respiratory distress.     Breath sounds: Normal breath sounds. No stridor. No decreased breath sounds, wheezing or rales.  Chest:     Chest wall: No tenderness.  Abdominal:     General: Bowel sounds are normal. There is no distension.     Palpations: Abdomen is soft. There is no mass.     Tenderness: There is no abdominal tenderness. There is no guarding or rebound.  Musculoskeletal:        General: No tenderness or deformity. Normal range of motion.     Cervical back: Normal range of motion and neck supple.  Lymphadenopathy:     Cervical: No cervical adenopathy.  Skin:    General: Skin is warm.     Coloration: Skin is not pale.     Findings: No erythema or rash.  Neurological:     Mental Status: He is alert and oriented to person, place, and time.     Cranial Nerves: No cranial nerve deficit.     Motor: No abnormal muscle tone.     Coordination: Coordination normal.     Deep Tendon  Reflexes: Reflexes are normal and symmetric.  Psychiatric:        Behavior: Behavior normal.        Thought Content: Thought content normal.        Judgment: Judgment normal.         Assessment & Plan:  General medical exam - Plan: CBC with Differential/Platelet, COMPLETE METABOLIC PANEL WITH GFR, Lipid panel  Essential hypertension - Plan: CBC with Differential/Platelet, COMPLETE METABOLIC PANEL WITH GFR, Lipid panel  Smoker unmotivated to quit - Plan: CBC with Differential/Platelet, COMPLETE METABOLIC PANEL WITH GFR, Lipid panel  S/P robot-assisted surgical procedure - Plan: CT Chest W Contrast  Hypercholesterolemia - Plan: CBC with Differential/Platelet, COMPLETE METABOLIC PANEL WITH GFR, Lipid panel  History of lung cancer - Plan: CBC with Differential/Platelet, COMPLETE METABOLIC PANEL WITH GFR, Lipid panel, CT Chest W Contrast  Prostate cancer screening - Plan: PSA  Coronary artery disease involving native coronary artery of native heart without angina pectoris  Colon cancer screening - Plan: Cologuard I recommended a flu shot but the patient declined.  I recommended Shingrix, COVID booster.  The patient defers these at the present time.  Blood pressure is acceptable.  Check CBC CMP and lipid panel.  Goal LDL cholesterol is less than  26.  He has a history of statin induced myopathy but he is currently on Repatha.  We discussed smoking cessation but at the present time he is unmotivated to quit and declines Chantix.  I will schedule the patient for CT scan of the chest to evaluate for any recurrence of his lung cancer as requested by his cardiothoracic surgeon.  I will check a PSA to screen for prostate cancer.  I will check a Cologuard screen for colon cancer.  We discussed WSFKCL for weight loss.

## 2022-06-16 LAB — COMPLETE METABOLIC PANEL WITH GFR
AG Ratio: 2 (calc) (ref 1.0–2.5)
ALT: 17 U/L (ref 9–46)
AST: 12 U/L (ref 10–35)
Albumin: 4.3 g/dL (ref 3.6–5.1)
Alkaline phosphatase (APISO): 63 U/L (ref 35–144)
BUN: 14 mg/dL (ref 7–25)
CO2: 25 mmol/L (ref 20–32)
Calcium: 9.2 mg/dL (ref 8.6–10.3)
Chloride: 107 mmol/L (ref 98–110)
Creat: 0.73 mg/dL (ref 0.70–1.35)
Globulin: 2.2 g/dL (calc) (ref 1.9–3.7)
Glucose, Bld: 99 mg/dL (ref 65–99)
Potassium: 4.3 mmol/L (ref 3.5–5.3)
Sodium: 141 mmol/L (ref 135–146)
Total Bilirubin: 0.4 mg/dL (ref 0.2–1.2)
Total Protein: 6.5 g/dL (ref 6.1–8.1)
eGFR: 102 mL/min/{1.73_m2} (ref >=60)

## 2022-06-16 LAB — CBC WITH DIFFERENTIAL/PLATELET
Absolute Monocytes: 337 cells/uL (ref 200–950)
Basophils Absolute: 61 cells/uL (ref 0–200)
Basophils Relative: 1.2 %
Eosinophils Absolute: 128 cells/uL (ref 15–500)
Eosinophils Relative: 2.5 %
HCT: 47.3 % (ref 38.5–50.0)
Hemoglobin: 15.5 g/dL (ref 13.2–17.1)
Lymphs Abs: 1765 cells/uL (ref 850–3900)
MCH: 32.2 pg (ref 27.0–33.0)
MCHC: 32.8 g/dL (ref 32.0–36.0)
MCV: 98.1 fL (ref 80.0–100.0)
MPV: 8.8 fL (ref 7.5–12.5)
Monocytes Relative: 6.6 %
Neutro Abs: 2810 cells/uL (ref 1500–7800)
Neutrophils Relative %: 55.1 %
Platelets: 157 10*3/uL (ref 140–400)
RBC: 4.82 10*6/uL (ref 4.20–5.80)
RDW: 12.6 % (ref 11.0–15.0)
Total Lymphocyte: 34.6 %
WBC: 5.1 10*3/uL (ref 3.8–10.8)

## 2022-06-16 LAB — PSA: PSA: 0.6 ng/mL (ref <=4.00)

## 2022-06-16 LAB — LIPID PANEL
Cholesterol: 117 mg/dL (ref <200)
HDL: 39 mg/dL — ABNORMAL LOW (ref >=40)
LDL Cholesterol (Calc): 60 mg/dL (calc)
Non-HDL Cholesterol (Calc): 78 mg/dL (calc) (ref <130)
Total CHOL/HDL Ratio: 3 (calc) (ref <5.0)
Triglycerides: 97 mg/dL (ref <150)

## 2022-06-25 ENCOUNTER — Other Ambulatory Visit: Payer: BC Managed Care – PPO

## 2022-06-27 DIAGNOSIS — Z1211 Encounter for screening for malignant neoplasm of colon: Secondary | ICD-10-CM | POA: Diagnosis not present

## 2022-07-02 ENCOUNTER — Other Ambulatory Visit: Payer: Self-pay | Admitting: Cardiovascular Disease

## 2022-07-06 LAB — COLOGUARD: COLOGUARD: NEGATIVE

## 2022-07-16 ENCOUNTER — Ambulatory Visit
Admission: RE | Admit: 2022-07-16 | Discharge: 2022-07-16 | Disposition: A | Discharge status: Home / Self Care | Payer: BC Managed Care – PPO | Source: Ambulatory Visit | Attending: Nurse Practitioner | Admitting: Nurse Practitioner

## 2022-07-16 VITALS — BP 145/74 | HR 74 | Temp 97.3°F | Resp 20

## 2022-07-16 DIAGNOSIS — F17200 Nicotine dependence, unspecified, uncomplicated: Secondary | ICD-10-CM

## 2022-07-16 DIAGNOSIS — J209 Acute bronchitis, unspecified: Secondary | ICD-10-CM

## 2022-07-16 DIAGNOSIS — J029 Acute pharyngitis, unspecified: Secondary | ICD-10-CM

## 2022-07-16 DIAGNOSIS — Z7952 Long term (current) use of systemic steroids: Secondary | ICD-10-CM | POA: Insufficient documentation

## 2022-07-16 DIAGNOSIS — R062 Wheezing: Secondary | ICD-10-CM | POA: Diagnosis not present

## 2022-07-16 DIAGNOSIS — H6121 Impacted cerumen, right ear: Secondary | ICD-10-CM | POA: Diagnosis not present

## 2022-07-16 DIAGNOSIS — Z79899 Other long term (current) drug therapy: Secondary | ICD-10-CM | POA: Insufficient documentation

## 2022-07-16 DIAGNOSIS — Z792 Long term (current) use of antibiotics: Secondary | ICD-10-CM | POA: Insufficient documentation

## 2022-07-16 DIAGNOSIS — Z1152 Encounter for screening for COVID-19: Secondary | ICD-10-CM | POA: Diagnosis not present

## 2022-07-16 LAB — RESP PANEL BY RT-PCR (FLU A&B, COVID) ARPGX2
Influenza A by PCR: NEGATIVE
Influenza B by PCR: NEGATIVE
SARS Coronavirus 2 by RT PCR: NEGATIVE

## 2022-07-16 LAB — POCT RAPID STREP A (OFFICE): Rapid Strep A Screen: NEGATIVE

## 2022-07-16 MED ORDER — BENZONATATE 100 MG PO CAPS
100.0000 mg | ORAL_CAPSULE | Freq: Three times a day (TID) | ORAL | 0 refills | Status: DC | PRN
Start: 2022-07-16 — End: 2022-11-30

## 2022-07-16 MED ORDER — ALBUTEROL SULFATE HFA 108 (90 BASE) MCG/ACT IN AERS: 1.0000 | INHALATION_SPRAY | Freq: Four times a day (QID) | RESPIRATORY_TRACT | 0 refills | Status: AC | PRN

## 2022-07-16 MED ORDER — AZITHROMYCIN 250 MG PO TABS: ORAL_TABLET | ORAL | 0 refills | Status: DC

## 2022-07-16 MED ORDER — PREDNISONE 20 MG PO TABS: 40.0000 mg | ORAL_TABLET | Freq: Every day | ORAL | 0 refills | Status: AC

## 2022-07-16 MED ORDER — ALBUTEROL SULFATE (2.5 MG/3ML) 0.083% IN NEBU
2.5000 mg | INHALATION_SOLUTION | Freq: Once | RESPIRATORY_TRACT | Status: AC
  Administered 2022-07-16: 2.5 mg via RESPIRATORY_TRACT

## 2022-07-16 NOTE — ED Provider Notes (Signed)
[facility]    CSN: [id number] Arrival date & time: 07/16/22  0920      History   Chief Complaint Chief Complaint  Patient presents with   Cough    Headache, Ears - Entered by patient    HPI LONG [name] is a 66 y.o. male.   Patient presents with 4 days of body aches, chills, low-grade fever at home, congested coughing, wheezing when he lays down, chest pain after coughing, chest and nasal congestion, runny nose, postnasal drainage, sneezing, sore throat, headache, bilateral ear pressure, decreased appetite, and fatigue.  He denies shortness of breath, chest pain without coughing, chest tightness, abdominal pain, nausea/vomiting, diarrhea, and loss of taste or smell.  He has taken Flonase, oral antihistamine, and ibuprofen for symptoms without much relief.  Reports he did an at home COVID test yesterday that was negative.  He has had shingles vaccine, however has not had flu or COVID-19 booster this season.  Patient reports he was on Augmentin a couple of months ago for a bacterial sinus infection.  Reports he has an appointment coming up with ear nose and throat for sleep apnea to discuss and alternatives to his CPAP.  Reports he thinks the CPAP makes his breathing worse.    Past Medical History:  Diagnosis Date   Arthritis of knee    seeing Dr. Alfonso Ramus   Cancer [facility])    Lung   Coronary artery disease    Depression with anxiety    hx of   Exogenous obesity    History of sleep apnea    Hyperlipidemia    Hypertension    Panic attacks    hx of   Sleep apnea    Uses a Cpap   Smoker unmotivated to quit    Varicose veins     Patient Active Problem List   Diagnosis Date Noted   COVID-19 08/16/2020   S/P robot-assisted surgical procedure 03/09/2020   Vitamin D deficiency 12/27/2015   Essential hypertension 12/21/2015   Varicose veins of left lower extremity with complications 36/62/9476   Varicose veins of right lower extremity with complications  54/65/0354   Varicose veins of bilateral lower extremities with other complications 65/68/1275   Varicose veins of leg with complications 17/00/1749   Varicose veins of both lower extremities 03/03/2015   Smoker unmotivated to quit    Malaise and fatigue 01/03/2012   Vertigo 01/03/2012   Ischemic heart disease 06/25/2011   Hypercholesterolemia 06/25/2011   Panic attacks    Arthritis of knee    Exogenous obesity    History of sleep apnea    Depression with anxiety     Past Surgical History:  Procedure Laterality Date   CARDIAC CATHETERIZATION  09/06/2008   stent to mid LAD and proximal LAD   COLONOSCOPY N/A 05/01/2013   Procedure: COLONOSCOPY;  Surgeon: [name], MD;  [facility];  Service: Endoscopy;  Laterality: N/A;  8:30 AM   EYE SURGERY  2006   Lasik on both eyes   INTERCOSTAL NERVE BLOCK Left 03/09/2020   Procedure: INTERCOSTAL NERVE BLOCK;  Surgeon: [name], MD;  Location: [place];  Service: Thoracic;  Laterality: Left;   LOBECTOMY  03/09/2020   XI ROBOTIC ASSISTED THORASCOPY - LOWER LEFT LOBE (LLL) WEDGE RESECTION AND LLL LOBECTOMY (Left)   LYMPH NODE DISSECTION N/A 03/09/2020   Procedure: LYMPH NODE DISSECTION;  Surgeon: [name], MD;  Location: [place];  Service: Thoracic;  Laterality: N/A;  polyps of lung  2008   removed by [facility]   TONSILLECTOMY     VIDEO BRONCHOSCOPY N/A 03/09/2020   Procedure: VIDEO BRONCHOSCOPY;  Surgeon: [name], MD;  [facility];  Service: Thoracic;  Laterality: N/A;       Home Medications    Prior to Admission medications   Medication Sig Start Date End Date Taking? Authorizing Provider  albuterol (VENTOLIN HFA) 108 (90 Base) MCG/ACT inhaler Inhale 1-2 puffs into the lungs every 6 (six) hours as needed for wheezing or shortness of breath. 07/16/22  Yes Eulogio Bear, NP  azithromycin (ZITHROMAX) 250 MG tablet Take (2) tablets by mouth on day 1, then take (1) tablet by  mouth on days 2-5. 07/16/22  Yes Eulogio Bear, NP  benzonatate (TESSALON) 100 MG capsule Take 1 capsule (100 mg total) by mouth 3 (three) times daily as needed for cough. Do not take with alcohol or while driving or operating heavy machinery.  May cause drowsiness. 07/16/22  Yes Eulogio Bear, NP  predniSONE (DELTASONE) 20 MG tablet Take 2 tablets (40 mg total) by mouth daily with breakfast for 5 days. 07/16/22 07/21/22 Yes Eulogio Bear, NP  carboxymethylcellul-glycerin (OPTIVE) 0.5-0.9 % ophthalmic solution Place 1 drop into both eyes daily as needed for dry eyes. refresh    [provider]  clopidogrel (PLAVIX) 75 MG tablet TAKE 1 TABLET(75 MG) BY MOUTH DAILY 07/02/22   Josue Hector, MD  fluticasone (FLONASE) 50 MCG/ACT nasal spray Place 2 sprays into both nostrils daily. Patient taking differently: Place 2 sprays into both nostrils daily as needed. 11/02/19   [name], MD  furosemide (LASIX) 20 MG tablet Take 1 tablet (20 mg total) by mouth daily as needed (shortness of breath). 05/30/22   Josue Hector, MD  guaiFENesin (MUCINEX) 600 MG 12 hr tablet Take 1 tablet (600 mg total) by mouth 2 (two) times daily as needed for cough or to loosen phlegm. 04/17/21   Eulogio Bear, NP  hydrochlorothiazide (HYDRODIURIL) 25 MG tablet Take 1 tablet (25 mg total) by mouth daily as needed (edema). 01/19/21   Josue Hector, MD  levocetirizine (XYZAL ALLERGY 24HR) 5 MG tablet Take 1 tablet (5 mg total) by mouth every evening. 07/06/21   [name], MD  losartan (COZAAR) 100 MG tablet TAKE 1 TABLET(100 MG) BY MOUTH DAILY 04/24/22   Josue Hector, MD  metoprolol succinate (TOPROL-XL) 25 MG 24 hr tablet TAKE 1/2 TABLET(12.5 MG) BY MOUTH DAILY 04/24/22   Josue Hector, MD  nitroGLYCERIN (NITROSTAT) 0.4 MG SL tablet Place 1 tablet (0.4 mg total) under the tongue every 5 (five) minutes as needed for chest pain. 01/19/21   Josue Hector, MD  PARoxetine (PAXIL) 40 MG  tablet TAKE 1 TABLET(40 MG) BY MOUTH DAILY 11/30/21   [name], MD  REPATHA SURECLICK 947 MG/ML SOAJ ADMINISTER 1 ML UNDER THE SKIN EVERY 14 DAYS 07/27/21   Josue Hector, MD    Family History Family History  Problem Relation Age of Onset   Heart disease Father        had cabg   Esophageal cancer Father 37       esophageal   Lung cancer Brother        lung cancer   Colon cancer Neg Hx     Social History Social History   Tobacco Use   Smoking status: Every Day    Packs/day: 0.50  Years: 40.00    Total pack years: 20.00    Types: Cigarettes    Start date: 60   Smokeless tobacco: Never  Vaping Use   Vaping Use: Never used  Substance Use Topics   Alcohol use: Yes    Alcohol/week: 0.0 standard drinks of alcohol    Comment: occasionally   Drug use: No     Allergies   Patient has no known allergies.   Review of Systems Review of Systems Per HPI  Physical Exam Triage Vital Signs ED Triage Vitals  Enc Vitals Group     BP 07/16/22 0938 (!) 145/74     Pulse Rate 07/16/22 0938 74     Resp 07/16/22 0938 20     Temp 07/16/22 0938 (!) 97.3 F (36.3 C)     Temp Source 07/16/22 0938 Oral     SpO2 07/16/22 0938 93 %     Weight --      Height --      Head Circumference --      Peak Flow --      Pain Score 07/16/22 0941 6     Pain Loc --      Pain Edu? --      Excl. in [place]? --    No data found.  Updated Vital Signs BP (!) 145/74 (BP Location: Right Arm)   Pulse 74   Temp (!) 97.3 F (36.3 C) (Oral)   Resp 20   SpO2 93%   Oxygen Saturation after breathing treatment: 96% on room air  Visual Acuity Right Eye Distance:   Left Eye Distance:   Bilateral Distance:    Right Eye Near:   Left Eye Near:    Bilateral Near:     Physical Exam Vitals and nursing note reviewed.  Constitutional:      General: He is not in acute distress.    Appearance: Normal appearance. He is not ill-appearing or toxic-appearing.  HENT:     Head: Normocephalic and  atraumatic.     Right Ear: Tympanic membrane, ear canal and external ear normal. There is impacted cerumen.     Left Ear: Tympanic membrane, ear canal and external ear normal. There is no impacted cerumen.     Nose: Congestion present. No rhinorrhea.     Mouth/Throat:     Mouth: Mucous membranes are moist.     Pharynx: Oropharynx is clear. Posterior oropharyngeal erythema present. No oropharyngeal exudate.     Tonsils: 0 on the right. 0 on the left.     Comments: Erythema to posterior palate Eyes:     General: No scleral icterus.    Extraocular Movements: Extraocular movements intact.  Cardiovascular:     Rate and Rhythm: Normal rate and regular rhythm.  Pulmonary:     Effort: Pulmonary effort is normal. No respiratory distress.     Breath sounds: Wheezing present. No rhonchi or rales.  Abdominal:     General: Abdomen is flat. Bowel sounds are normal. There is no distension.     Palpations: Abdomen is soft.     Tenderness: There is no abdominal tenderness. There is no guarding.  Musculoskeletal:     Cervical back: Normal range of motion and neck supple.  Lymphadenopathy:     Cervical: No cervical adenopathy.  Skin:    General: Skin is warm and dry.     Coloration: Skin is not jaundiced or pale.     Findings: No erythema or rash.  Neurological:  Mental Status: He is alert and oriented to person, place, and time.  Psychiatric:        Behavior: Behavior is cooperative.      UC Treatments / Results  Labs (all labs ordered are listed, but only abnormal results are displayed) Labs Reviewed  RESP PANEL BY RT-PCR (FLU A&B, COVID) ARPGX2  CULTURE, GROUP A STREP [facility])  POCT RAPID STREP A (OFFICE)    EKG   Radiology No results found.  Procedures Procedures (including critical care time)  Medications Ordered in UC Medications  albuterol (PROVENTIL) (2.5 MG/3ML) 0.083% nebulizer solution 2.5 mg (2.5 mg Nebulization Given 07/16/22 1048)    Initial Impression /  Assessment and Plan / UC Course  I have reviewed the triage vital signs and the nursing notes.  Pertinent labs & imaging results that were available during my care of the patient were reviewed by me and considered in my medical decision making (see chart for details).   Patient is well-appearing, normotensive, afebrile, not tachycardic, not tachypneic, oxygenating well on room air.    Wheezing Current every day smoker Acute bronchitis, unspecified organism Suspect viral etiology COVID-19, influenza testing obtained Patient is a candidate for Tamiflu or Paxlovid if he tests positive.  Last GFR about 4 weeks ago greater than 100. Albuterol nebulizer given in urgent care today with relief of wheezing.  Oxygenation increased to 96% on room air after nebulizer Start oral prednisone 40 mg daily for 5 days Start albuterol inhaler at home Can also start Tessalon for dry cough Will cover with azithromycin given smoking history, suspect possible underlying chronic bronchitis  Impacted cerumen of right ear Ear lavage provided with full relief of impacted cerumen of right ear Discouraged use of Q-tips moving forward  Acute pharyngitis, unspecified etiology Rapid strep throat test negative, throat culture pending Would recommend treatment with Augmentin if he tests positive and continues to be symptomatic   The patient was given the opportunity to ask questions.  All questions answered to their satisfaction.  The patient is in agreement to this plan.    Final Clinical Impressions(s) / UC Diagnoses   Final diagnoses:  Wheezing  Impacted cerumen of right ear  Acute pharyngitis, unspecified etiology  Current every day smoker  Acute bronchitis, unspecified organism     Discharge Instructions      We were able to flush the ear wax out of your right ear today.  I suspect your symptoms are secondary to a viral upper respiratory infection.  We have tested you for COVID-19 and influenza and we  will call you if either of these come back positive tomorrow.  We have given you a breathing treatment today to help open up your lungs.  Please continue the albuterol inhaler at home every 4-6 hours as needed for wheezing or shortness of breath.  Please start the azithromycin and prednisone to help with inflammation in your lungs.  To help with the cough, you can take Tessalon Perles every 8 hours as needed.  If your symptoms worsen despite treatment, seek care.     ED Prescriptions     Medication Sig Dispense Auth. Provider   predniSONE (DELTASONE) 20 MG tablet Take 2 tablets (40 mg total) by mouth daily with breakfast for 5 days. 10 tablet Noemi Chapel A, NP   azithromycin (ZITHROMAX) 250 MG tablet Take (2) tablets by mouth on day 1, then take (1) tablet by mouth on days 2-5. 6 tablet Eulogio Bear, NP   albuterol (VENTOLIN HFA)  108 (90 Base) MCG/ACT inhaler Inhale 1-2 puffs into the lungs every 6 (six) hours as needed for wheezing or shortness of breath. 18 g Noemi Chapel A, NP   benzonatate (TESSALON) 100 MG capsule Take 1 capsule (100 mg total) by mouth 3 (three) times daily as needed for cough. Do not take with alcohol or while driving or operating heavy machinery.  May cause drowsiness. 21 capsule Eulogio Bear, NP      PDMP not reviewed this encounter.   Eulogio Bear, NP 07/16/22 1152

## 2022-07-16 NOTE — ED Triage Notes (Signed)
Pt reports chest congestion, sinus infection, body aches, headache ears are stopped up, drainage in throat. Saturday the symptoms started. Yesterday he ran a low grade fever of 100.0  Took 3 ibuprofen for body aches which helped a bit.

## 2022-07-16 NOTE — Discharge Instructions (Addendum)
We were able to flush the ear wax out of your right ear today.  I suspect your symptoms are secondary to a viral upper respiratory infection.  We have tested you for COVID-19 and influenza and we will call you if either of these come back positive tomorrow.  We have given you a breathing treatment today to help open up your lungs.  Please continue the albuterol inhaler at home every 4-6 hours as needed for wheezing or shortness of breath.  Please start the azithromycin and prednisone to help with inflammation in your lungs.  To help with the cough, you can take Tessalon Perles every 8 hours as needed.  If your symptoms worsen despite treatment, seek care.

## 2022-07-19 LAB — CULTURE, GROUP A STREP (THRC)

## 2022-08-02 DIAGNOSIS — G4733 Obstructive sleep apnea (adult) (pediatric): Secondary | ICD-10-CM | POA: Insufficient documentation

## 2022-08-02 DIAGNOSIS — J329 Chronic sinusitis, unspecified: Secondary | ICD-10-CM | POA: Diagnosis not present

## 2022-08-08 ENCOUNTER — Other Ambulatory Visit: Payer: Self-pay | Admitting: Cardiovascular Disease

## 2022-08-15 DIAGNOSIS — M170 Bilateral primary osteoarthritis of knee: Secondary | ICD-10-CM | POA: Diagnosis not present

## 2022-09-25 ENCOUNTER — Other Ambulatory Visit: Payer: Self-pay | Admitting: Thoracic Surgery (Cardiothoracic Vascular Surgery)

## 2022-09-25 DIAGNOSIS — Z85118 Personal history of other malignant neoplasm of bronchus and lung: Secondary | ICD-10-CM

## 2022-09-30 ENCOUNTER — Encounter: Payer: Self-pay | Admitting: Family Medicine

## 2022-10-01 ENCOUNTER — Other Ambulatory Visit: Payer: Self-pay

## 2022-10-01 DIAGNOSIS — J329 Chronic sinusitis, unspecified: Secondary | ICD-10-CM

## 2022-10-01 MED ORDER — FLUTICASONE PROPIONATE 50 MCG/ACT NA SUSP: 2.0000 | Freq: Every day | NASAL | 6 refills | Status: DC

## 2022-10-19 ENCOUNTER — Encounter: Payer: Self-pay | Admitting: Family Medicine

## 2022-10-19 ENCOUNTER — Ambulatory Visit: Payer: BC Managed Care – PPO | Admitting: Family Medicine

## 2022-10-19 VITALS — BP 138/78 | HR 65 | Temp 98.7°F | Ht 73.0 in | Wt 320.0 lb

## 2022-10-19 DIAGNOSIS — J329 Chronic sinusitis, unspecified: Secondary | ICD-10-CM | POA: Diagnosis not present

## 2022-10-19 MED ORDER — AMOXICILLIN-POT CLAVULANATE 875-125 MG PO TABS: 1.0000 | ORAL_TABLET | Freq: Two times a day (BID) | ORAL | 0 refills | Status: DC

## 2022-10-19 NOTE — Progress Notes (Signed)
Subjective:    Patient ID: [name], male    DOB: 12/11/1955, 67 y.o.   MRN: [id number]  Patient is a very pleasant 67 year old Caucasian gentleman who presents with a 1 week history of pain and pressure in his sinuses.  He reports a headache.  He reports postnasal drip.  He reports pain in his right maxillary sinus and in his right final sinus.  The postnasal drip is causing a cough productive of purulent sputum.  He denies any wheezing.  He is not having to use his albuterol.  He is not short of breath or experiencing any chest pain.  He denies any fever.  Past Medical History:  Diagnosis Date   Arthritis of knee    seeing Dr. Alfonso Ramus   Cancer [facility])    Lung   Coronary artery disease    Depression with anxiety    hx of   Exogenous obesity    History of sleep apnea    Hyperlipidemia    Hypertension    Panic attacks    hx of   Sleep apnea    Uses a Cpap   Smoker unmotivated to quit    Varicose veins    Past Surgical History:  Procedure Laterality Date   CARDIAC CATHETERIZATION  09/06/2008   stent to mid LAD and proximal LAD   COLONOSCOPY N/A 05/01/2013   Procedure: COLONOSCOPY;  Surgeon: [name], MD;  [facility];  Service: Endoscopy;  Laterality: N/A;  8:30 AM   EYE SURGERY  2006   Lasik on both eyes   INTERCOSTAL NERVE BLOCK Left 03/09/2020   Procedure: INTERCOSTAL NERVE BLOCK;  Surgeon: [name], MD;  Location: [place];  Service: Thoracic;  Laterality: Left;   LOBECTOMY  03/09/2020   XI ROBOTIC ASSISTED THORASCOPY - LOWER LEFT LOBE (LLL) WEDGE RESECTION AND LLL LOBECTOMY (Left)   LYMPH NODE DISSECTION N/A 03/09/2020   Procedure: LYMPH NODE DISSECTION;  Surgeon: [name], MD;  Location: [place];  Service: Thoracic;  Laterality: N/A;   polyps of lung  2008   removed by [facility]   TONSILLECTOMY     VIDEO BRONCHOSCOPY N/A 03/09/2020   Procedure: VIDEO BRONCHOSCOPY;  Surgeon: [name], MD;  Location: [place];   Service: Thoracic;  Laterality: N/A;   Current Outpatient Medications on File Prior to Visit  Medication Sig Dispense Refill   albuterol (VENTOLIN HFA) 108 (90 Base) MCG/ACT inhaler Inhale 1-2 puffs into the lungs every 6 (six) hours as needed for wheezing or shortness of breath. 18 g 0   benzonatate (TESSALON) 100 MG capsule Take 1 capsule (100 mg total) by mouth 3 (three) times daily as needed for cough. Do not take with alcohol or while driving or operating heavy machinery.  May cause drowsiness. 21 capsule 0   carboxymethylcellul-glycerin (OPTIVE) 0.5-0.9 % ophthalmic solution Place 1 drop into both eyes daily as needed for dry eyes. refresh     clopidogrel (PLAVIX) 75 MG tablet TAKE 1 TABLET(75 MG) BY MOUTH DAILY 30 tablet 11   fluticasone (FLONASE) 50 MCG/ACT nasal spray Place 2 sprays into both nostrils daily. 16 g 6   furosemide (LASIX) 20 MG tablet Take 1 tablet (20 mg total) by mouth daily as needed (shortness of breath). 30 tablet 11   guaiFENesin (MUCINEX) 600 MG 12 hr tablet Take 1 tablet (600 mg total) by mouth 2 (two) times daily as needed for cough or to loosen phlegm. 30 tablet 0  hydrochlorothiazide (HYDRODIURIL) 25 MG tablet Take 1 tablet (25 mg total) by mouth daily as needed (edema). 30 tablet 1   levocetirizine (XYZAL ALLERGY 24HR) 5 MG tablet Take 1 tablet (5 mg total) by mouth every evening. 30 tablet 3   losartan (COZAAR) 100 MG tablet TAKE 1 TABLET(100 MG) BY MOUTH DAILY 90 tablet 2   metoprolol succinate (TOPROL-XL) 25 MG 24 hr tablet TAKE 1/2 TABLET(12.5 MG) BY MOUTH DAILY 15 tablet 3   nitroGLYCERIN (NITROSTAT) 0.4 MG SL tablet Place 1 tablet (0.4 mg total) under the tongue every 5 (five) minutes as needed for chest pain. 25 tablet 0   PARoxetine (PAXIL) 40 MG tablet TAKE 1 TABLET(40 MG) BY MOUTH DAILY 90 tablet 3   REPATHA SURECLICK [id number] MG/ML SOAJ ADMINISTER 1 ML UNDER THE SKIN EVERY 14 DAYS 2 mL 11   Current Facility-Administered Medications on File Prior to Visit   Medication Dose Route Frequency Provider Last Rate Last Admin   albuterol (PROVENTIL) (2.5 MG/3ML) 0.083% nebulizer solution 2.5 mg  2.5 mg Nebulization Once Eulogio Bear, NP       No Known Allergies Social History   Socioeconomic History   Marital status: Married    Spouse name: Not on file   Number of children: Not on file   Years of education: Not on file   Highest education level: Not on file  Occupational History   Occupation: [employer]  Tobacco Use   Smoking status: Every Day    Packs/day: 0.50    Years: 40.00    Total pack years: 20.00    Types: Cigarettes    Start date: 1973   Smokeless tobacco: Never  Vaping Use   Vaping Use: Never used  Substance and Sexual Activity   Alcohol use: Yes    Alcohol/week: 0.0 standard drinks of alcohol    Comment: occasionally   Drug use: No   Sexual activity: Not on file  Other Topics Concern   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   Social Determinants of Health   Financial Resource Strain: Not on file  Food Insecurity: Not on file  Transportation Needs: Not on file  Physical Activity: Not on file  Stress: Not on file  Social Connections: Not on file  Intimate Partner Violence: Not on file       Review of Systems  Respiratory:  Positive for cough.        Objective:   Physical Exam Vitals reviewed.  Constitutional:      General: He is not in acute distress.    Appearance: He is well-developed. He is not diaphoretic.  HENT:     Right Ear: Tympanic membrane and ear canal normal.     Left Ear: Tympanic membrane and ear canal normal.     Nose: Mucosal edema, congestion and rhinorrhea present.     Right Turbinates: Swollen.     Left Turbinates: Enlarged and swollen.     Right Sinus: Maxillary sinus tenderness and frontal sinus tenderness present.     Mouth/Throat:     Pharynx: No oropharyngeal exudate.  Cardiovascular:     Rate and Rhythm: Normal rate  and regular rhythm.     Heart sounds: Normal heart sounds. No murmur heard. Pulmonary:     Effort: Pulmonary effort is normal. No respiratory distress.     Breath sounds: Normal breath sounds. No wheezing or rales.  Chest:     Chest  wall: No tenderness.  Musculoskeletal:     Cervical back: Neck supple.  Lymphadenopathy:     Cervical: No cervical adenopathy.           Rhinosinusitis Patient has been using his Flonase and his eyes all.  Unfortunately he has developed a secondary sinus infection.  Begin Augmentin 875 mg twice daily for 10 days and use a Nettie pot to help facilitate drainage.  Continue Flonase.  At the present time there is no indication for steroids or albuterol as he is not experiencing COPD exacerbation.  I believe the cough is secondary to the postnasal drip and drainage

## 2022-11-13 ENCOUNTER — Encounter: Payer: Self-pay | Admitting: Thoracic Surgery (Cardiothoracic Vascular Surgery)

## 2022-11-16 ENCOUNTER — Ambulatory Visit: Payer: BC Managed Care – PPO | Admitting: Thoracic Surgery (Cardiothoracic Vascular Surgery)

## 2022-11-16 ENCOUNTER — Ambulatory Visit
Admission: RE | Admit: 2022-11-16 | Discharge: 2022-11-16 | Disposition: A | Discharge status: Home / Self Care | Payer: BC Managed Care – PPO | Source: Ambulatory Visit | Attending: Thoracic Surgery (Cardiothoracic Vascular Surgery) | Admitting: Thoracic Surgery (Cardiothoracic Vascular Surgery)

## 2022-11-16 DIAGNOSIS — Z08 Encounter for follow-up examination after completed treatment for malignant neoplasm: Secondary | ICD-10-CM

## 2022-11-16 DIAGNOSIS — C3490 Malignant neoplasm of unspecified part of unspecified bronchus or lung: Secondary | ICD-10-CM | POA: Diagnosis not present

## 2022-11-16 DIAGNOSIS — Z85118 Personal history of other malignant neoplasm of bronchus and lung: Secondary | ICD-10-CM

## 2022-11-16 DIAGNOSIS — I700 Atherosclerosis of aorta: Secondary | ICD-10-CM | POA: Diagnosis not present

## 2022-11-16 NOTE — Progress Notes (Signed)
     [address]       [place] [address]             [phone]       Patient: Home Provider: Office Consent for Telemedicine visit obtained.  Today's visit was completed via a real-time telehealth (see specific modality noted below). The patient/authorized person provided oral consent at the time of the visit to engage in a telemedicine encounter with the present provider at [facility]. The patient/authorized person was informed of the potential benefits, limitations, and risks of telemedicine. The patient/authorized person expressed understanding that the laws that protect confidentiality also apply to telemedicine. The patient/authorized person acknowledged understanding that telemedicine does not provide emergency services and that he or she would need to call 911 or proceed to the nearest hospital for help if such a need arose.   Total time spent in the clinical discussion 10 minutes.  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  Overall he is doing well.  I personally reviewed his CT, and there is no evidence of any new or recurrent disease.  I will see him again in 12 months with a CT chest.   IMPRESSION: 1. No findings of recurrent malignancy. 2. 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 3. Aortic atherosclerosis.

## 2022-11-30 ENCOUNTER — Encounter: Payer: Self-pay | Admitting: Family Medicine

## 2022-11-30 ENCOUNTER — Ambulatory Visit: Payer: BC Managed Care – PPO | Admitting: Family Medicine

## 2022-11-30 VITALS — BP 120/76 | HR 64 | Temp 97.6°F | Ht 73.0 in | Wt 314.0 lb

## 2022-11-30 DIAGNOSIS — I2510 Atherosclerotic heart disease of native coronary artery without angina pectoris: Secondary | ICD-10-CM | POA: Diagnosis not present

## 2022-11-30 DIAGNOSIS — M25562 Pain in left knee: Secondary | ICD-10-CM

## 2022-11-30 DIAGNOSIS — M25561 Pain in right knee: Secondary | ICD-10-CM

## 2022-11-30 DIAGNOSIS — G8929 Other chronic pain: Secondary | ICD-10-CM | POA: Diagnosis not present

## 2022-11-30 NOTE — Progress Notes (Signed)
Subjective:    Patient ID: [name], male    DOB: [birth year], 67 y.o.   MRN: [id number] Patient has bilateral knee pain.  He has end-stage osteoarthritis of both knees.  He has been receiving cortisone injections twice under the left knee injection of both knees was in December.  His knees are aching and throbbing at night and keeping him resting.  He is trying to have knee replacement surgery this fall.  He is requesting a cortisone injection in both knees.  He is unable to take NSAIDs due to his history with coronary artery disease and antiplatelet therapy.  He also is requesting assistance with weight loss.  He is interested in the GLP-1 agonist.  He would also benefit from this due to his history of coronary artery disease.  Past Medical History:  Diagnosis Date   Arthritis of knee    seeing Dr. Farris HasKramer   Cancer    Lung   Coronary artery disease    Depression with anxiety    hx of   Exogenous obesity    History of sleep apnea    Hyperlipidemia    Hypertension    Panic attacks    hx of   Sleep apnea    Uses a Cpap   Smoker unmotivated to quit    Varicose veins    Past Surgical History:  Procedure Laterality Date   CARDIAC CATHETERIZATION  09/06/2008   stent to mid LAD and proximal LAD   COLONOSCOPY N/A 05/01/2013   Procedure: COLONOSCOPY;  Surgeon: West BaliSandi L Fields, MD;  [facility];  Service: Endoscopy;  Laterality: N/A;  8:30 AM   EYE SURGERY  2006   Lasik on both eyes   INTERCOSTAL NERVE BLOCK Left 03/09/2020   Procedure: INTERCOSTAL NERVE BLOCK;  Surgeon: Corliss SkainsLightfoot, Harrell O, MD;  [facility];  Service: Thoracic;  Laterality: Left;   LOBECTOMY  03/09/2020   XI ROBOTIC ASSISTED THORASCOPY - LOWER LEFT LOBE (LLL) WEDGE RESECTION AND LLL LOBECTOMY (Left)   LYMPH NODE DISSECTION N/A 03/09/2020   Procedure: LYMPH NODE DISSECTION;  Surgeon: Corliss SkainsLightfoot, Harrell O, MD;  [facility];  Service: Thoracic;  Laterality: N/A;   polyps of lung  2008   removed by  [facility]   TONSILLECTOMY     VIDEO BRONCHOSCOPY N/A 03/09/2020   Procedure: VIDEO BRONCHOSCOPY;  Surgeon: Corliss SkainsLightfoot, Harrell O, MD;  [facility];  Service: Thoracic;  Laterality: N/A;   Current Outpatient Medications on File Prior to Visit  Medication Sig Dispense Refill   albuterol (VENTOLIN HFA) 108 (90 Base) MCG/ACT inhaler Inhale 1-2 puffs into the lungs every 6 (six) hours as needed for wheezing or shortness of breath. 18 g 0   carboxymethylcellul-glycerin (OPTIVE) 0.5-0.9 % ophthalmic solution Place 1 drop into both eyes daily as needed for dry eyes. refresh     clopidogrel (PLAVIX) 75 MG tablet TAKE 1 TABLET(75 MG) BY MOUTH DAILY 30 tablet 11   fluticasone (FLONASE) 50 MCG/ACT nasal spray Place 2 sprays into both nostrils daily. 16 g 6   furosemide (LASIX) 20 MG tablet Take 1 tablet (20 mg total) by mouth daily as needed (shortness of breath). 30 tablet 11   guaiFENesin (MUCINEX) 600 MG 12 hr tablet Take 1 tablet (600 mg total) by mouth 2 (two) times daily as needed for cough or to loosen phlegm. 30 tablet 0   hydrochlorothiazide (HYDRODIURIL) 25 MG tablet Take 1 tablet (25 mg total) by mouth daily as needed (edema).  30 tablet 1   levocetirizine (XYZAL ALLERGY 24HR) 5 MG tablet Take 1 tablet (5 mg total) by mouth every evening. 30 tablet 3   losartan (COZAAR) 100 MG tablet TAKE 1 TABLET(100 MG) BY MOUTH DAILY 90 tablet 2   metoprolol succinate (TOPROL-XL) 25 MG 24 hr tablet TAKE 1/2 TABLET(12.5 MG) BY MOUTH DAILY 15 tablet 3   nitroGLYCERIN (NITROSTAT) 0.4 MG SL tablet Place 1 tablet (0.4 mg total) under the tongue every 5 (five) minutes as needed for chest pain. 25 tablet 0   PARoxetine (PAXIL) 40 MG tablet TAKE 1 TABLET(40 MG) BY MOUTH DAILY 90 tablet 3   REPATHA SURECLICK 140 MG/ML SOAJ ADMINISTER 1 ML UNDER THE SKIN EVERY 14 DAYS 2 mL 11   Current Facility-Administered Medications on File Prior to Visit  Medication Dose Route Frequency Provider Last Rate Last Admin    albuterol (PROVENTIL) (2.5 MG/3ML) 0.083% nebulizer solution 2.5 mg  2.5 mg Nebulization Once Valentino Nose, NP       No Known Allergies Social History   Socioeconomic History   Marital status: Married    Spouse name: Not on file   Number of children: Not on file   Years of education: Not on file   Highest education level: Not on file  Occupational History   Occupation: [employer]  Tobacco Use   Smoking status: Every Day    Packs/day: 0.50    Years: 40.00    Additional pack years: 0.00    Total pack years: 20.00    Types: Cigarettes    Start date: 1973   Smokeless tobacco: Never  Vaping Use   Vaping Use: Never used  Substance and Sexual Activity   Alcohol use: Yes    Alcohol/week: 0.0 standard drinks of alcohol    Comment: occasionally   Drug use: No   Sexual activity: Not on file  Other Topics Concern   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   Social Determinants of Health   Financial Resource Strain: Not on file  Food Insecurity: Not on file  Transportation Needs: Not on file  Physical Activity: Not on file  Stress: Not on file  Social Connections: Not on file  Intimate Partner Violence: Not on file       Review of Systems     Objective:   Physical Exam Vitals reviewed.  Constitutional:      General: He is not in acute distress.    Appearance: He is well-developed. He is not diaphoretic.  HENT:     Nose: Mucosal edema present.     Right Turbinates: Swollen.     Left Turbinates: Enlarged and swollen.     Right Sinus: Maxillary sinus tenderness and frontal sinus tenderness present.  Cardiovascular:     Rate and Rhythm: Normal rate and regular rhythm.     Heart sounds: Normal heart sounds. No murmur heard. Pulmonary:     Effort: Pulmonary effort is normal. No respiratory distress.     Breath sounds: Normal breath sounds. No wheezing or rales.  Chest:     Chest wall: No tenderness.   Musculoskeletal:     Right knee: No effusion. Decreased range of motion. Tenderness present over the medial joint line and lateral joint line.     Left knee: No effusion. Decreased range of motion. Tenderness present over the medial joint line and lateral joint line.  Coronary artery disease involving native coronary artery of native heart without angina pectoris  Chronic pain of both knees  Using sterile technique, I injected the right knee with 2 cc of lidocaine, 2 cc of Marcaine, and 2 cc of 40 mg/mL Kenalog.  The patient tolerated this injection well without complication.  I repeated the exact same injection in the left knee without complication.  We then had a discussion about GLP-1 agonist for weight loss I believe to benefit from the without knee pain, it would also lower his cardiovascular mortality given his underlying history of cardiovascular disease.  The patient will check with his insurance to see if they are covered.  If so I would recommend either Wegovy or zepbound.

## 2022-12-10 ENCOUNTER — Encounter: Payer: Self-pay | Admitting: Family Medicine

## 2022-12-13 ENCOUNTER — Other Ambulatory Visit: Payer: Self-pay | Admitting: Cardiovascular Disease

## 2022-12-13 ENCOUNTER — Other Ambulatory Visit: Payer: Self-pay | Admitting: Family Medicine

## 2022-12-13 DIAGNOSIS — F410 Panic disorder [episodic paroxysmal anxiety] without agoraphobia: Secondary | ICD-10-CM

## 2022-12-27 ENCOUNTER — Encounter: Payer: Self-pay | Admitting: Cardiovascular Disease

## 2022-12-27 DIAGNOSIS — I8393 Asymptomatic varicose veins of bilateral lower extremities: Secondary | ICD-10-CM

## 2022-12-28 ENCOUNTER — Encounter: Payer: Self-pay | Admitting: Family Medicine

## 2022-12-31 ENCOUNTER — Other Ambulatory Visit: Payer: Self-pay | Admitting: Family Medicine

## 2022-12-31 DIAGNOSIS — I8311 Varicose veins of right lower extremity with inflammation: Secondary | ICD-10-CM

## 2022-12-31 NOTE — Telephone Encounter (Signed)
Patient's PCP already placed referral.

## 2023-02-04 DIAGNOSIS — I83819 Varicose veins of unspecified lower extremities with pain: Secondary | ICD-10-CM | POA: Diagnosis not present

## 2023-02-04 DIAGNOSIS — I872 Venous insufficiency (chronic) (peripheral): Secondary | ICD-10-CM | POA: Diagnosis not present

## 2023-02-04 DIAGNOSIS — Z1330 Encounter for screening examination for mental health and behavioral disorders, unspecified: Secondary | ICD-10-CM | POA: Diagnosis not present

## 2023-02-25 DIAGNOSIS — I872 Venous insufficiency (chronic) (peripheral): Secondary | ICD-10-CM | POA: Diagnosis not present

## 2023-02-25 DIAGNOSIS — I83819 Varicose veins of unspecified lower extremities with pain: Secondary | ICD-10-CM | POA: Diagnosis not present

## 2023-03-18 ENCOUNTER — Other Ambulatory Visit: Payer: Self-pay | Admitting: Family Medicine

## 2023-03-18 DIAGNOSIS — F410 Panic disorder [episodic paroxysmal anxiety] without agoraphobia: Secondary | ICD-10-CM

## 2023-03-19 ENCOUNTER — Encounter: Payer: Self-pay | Admitting: Cardiovascular Disease

## 2023-03-19 MED ORDER — NITROGLYCERIN 0.4 MG SL SUBL: 0.4000 mg | SUBLINGUAL_TABLET | SUBLINGUAL | 5 refills | Status: AC | PRN

## 2023-04-01 ENCOUNTER — Encounter: Payer: Self-pay | Admitting: Family Medicine

## 2023-04-01 ENCOUNTER — Ambulatory Visit: Payer: BC Managed Care – PPO | Admitting: Family Medicine

## 2023-04-01 VITALS — BP 122/72 | HR 64 | Temp 98.6°F | Ht 73.0 in | Wt 314.6 lb

## 2023-04-01 DIAGNOSIS — M25562 Pain in left knee: Secondary | ICD-10-CM

## 2023-04-01 DIAGNOSIS — M25561 Pain in right knee: Secondary | ICD-10-CM | POA: Diagnosis not present

## 2023-04-01 DIAGNOSIS — G8929 Other chronic pain: Secondary | ICD-10-CM | POA: Diagnosis not present

## 2023-04-01 NOTE — Progress Notes (Signed)
Subjective:    Patient ID: [name], male    DOB: 20-Oct-1955, 67 y.o.   MRN: [id number] Patient has bilateral knee pain.  He has end-stage osteoarthritis of both knees.  He has been receiving cortisone injections.  Last injection of both knees was in April.  His knees are causing constant pain.  He is trying to have knee replacement surgery this fall.  He is requesting a cortisone injection in both knees.  He is unable to take NSAIDs due to his history with coronary artery disease and antiplatelet therapy.   Past Medical History:  Diagnosis Date   Arthritis of knee    seeing Dr. Farris Has   Cancer [facility])    Lung   Coronary artery disease    Depression with anxiety    hx of   Exogenous obesity    History of sleep apnea    Hyperlipidemia    Hypertension    Panic attacks    hx of   Sleep apnea    Uses a Cpap   Smoker unmotivated to quit    Varicose veins    Past Surgical History:  Procedure Laterality Date   CARDIAC CATHETERIZATION  09/06/2008   stent to mid LAD and proximal LAD   COLONOSCOPY N/A 05/01/2013   Procedure: COLONOSCOPY;  Surgeon: West Bali, MD;  [facility];  Service: Endoscopy;  Laterality: N/A;  8:30 AM   EYE SURGERY  2006   Lasik on both eyes   INTERCOSTAL NERVE BLOCK Left 03/09/2020   Procedure: INTERCOSTAL NERVE BLOCK;  Surgeon: Corliss Skains, MD;  [facility];  Service: Thoracic;  Laterality: Left;   LOBECTOMY  03/09/2020   XI ROBOTIC ASSISTED THORASCOPY - LOWER LEFT LOBE (LLL) WEDGE RESECTION AND LLL LOBECTOMY (Left)   LYMPH NODE DISSECTION N/A 03/09/2020   Procedure: LYMPH NODE DISSECTION;  Surgeon: Corliss Skains, MD;  [facility];  Service: Thoracic;  Laterality: N/A;   polyps of lung  2008   removed by [facility]   TONSILLECTOMY     VIDEO BRONCHOSCOPY N/A 03/09/2020   Procedure: VIDEO BRONCHOSCOPY;  Surgeon: Corliss Skains, MD;  [facility];  Service: Thoracic;  Laterality: N/A;   Current  Outpatient Medications on File Prior to Visit  Medication Sig Dispense Refill   albuterol (VENTOLIN HFA) 108 (90 Base) MCG/ACT inhaler Inhale 1-2 puffs into the lungs every 6 (six) hours as needed for wheezing or shortness of breath. 18 g 0   carboxymethylcellul-glycerin (OPTIVE) 0.5-0.9 % ophthalmic solution Place 1 drop into both eyes daily as needed for dry eyes. refresh     clopidogrel (PLAVIX) 75 MG tablet TAKE 1 TABLET(75 MG) BY MOUTH DAILY 30 tablet 11   fluticasone (FLONASE) 50 MCG/ACT nasal spray Place 2 sprays into both nostrils daily. 16 g 6   furosemide (LASIX) 20 MG tablet Take 1 tablet (20 mg total) by mouth daily as needed (shortness of breath). 30 tablet 11   guaiFENesin (MUCINEX) 600 MG 12 hr tablet Take 1 tablet (600 mg total) by mouth 2 (two) times daily as needed for cough or to loosen phlegm. 30 tablet 0   hydrochlorothiazide (HYDRODIURIL) 25 MG tablet Take 1 tablet (25 mg total) by mouth daily as needed (edema). 30 tablet 1   levocetirizine (XYZAL ALLERGY 24HR) 5 MG tablet Take 1 tablet (5 mg total) by mouth every evening. 30 tablet 3   losartan (COZAAR) 100 MG tablet TAKE 1 TABLET(100 MG) BY MOUTH DAILY  90 tablet 2   metoprolol succinate (TOPROL-XL) 25 MG 24 hr tablet TAKE 1/2 TABLET BY MOUTH ONCE DAILY. 45 tablet 1   nitroGLYCERIN (NITROSTAT) 0.4 MG SL tablet Place 1 tablet (0.4 mg total) under the tongue every 5 (five) minutes as needed for chest pain. 25 tablet 5   PARoxetine (PAXIL) 40 MG tablet TAKE (1) TABLET BY MOUTH ONCE DAILY. 90 tablet 1   REPATHA SURECLICK 140 MG/ML SOAJ ADMINISTER 1 ML UNDER THE SKIN EVERY 14 DAYS 2 mL 11   Current Facility-Administered Medications on File Prior to Visit  Medication Dose Route Frequency Provider Last Rate Last Admin   albuterol (PROVENTIL) (2.5 MG/3ML) 0.083% nebulizer solution 2.5 mg  2.5 mg Nebulization Once Valentino Nose, NP       No Known Allergies Social History   Socioeconomic History   Marital status: Married     Spouse name: Not on file   Number of children: Not on file   Years of education: Not on file   Highest education level: Associate degree: occupational, Scientist, product/process development, or vocational program  Occupational History   Occupation: [employer]  Tobacco Use   Smoking status: Every Day    Current packs/day: 0.50    Average packs/day: 0.5 packs/day for 51.6 years (25.8 ttl pk-yrs)    Types: Cigarettes    Start date: 1973   Smokeless tobacco: Never  Vaping Use   Vaping status: Never Used  Substance and Sexual Activity   Alcohol use: Yes    Alcohol/week: 0.0 standard drinks of alcohol    Comment: occasionally   Drug use: No   Sexual activity: Not on file  Other Topics Concern   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   Social Determinants of Health   Financial Resource Strain: Patient Declined (03/27/2023)   Overall Financial Resource Strain (CARDIA)    Difficulty of Paying Living Expenses: Patient declined  Food Insecurity: Patient Declined (03/27/2023)   Hunger Vital Sign    Worried About Running Out of Food in the Last Year: Patient declined    Ran Out of Food in the Last Year: Patient declined  Transportation Needs: No Transportation Needs (03/27/2023)   PRAPARE - Administrator, Civil Service (Medical): No    Lack of Transportation (Non-Medical): No  Physical Activity: Unknown (03/27/2023)   Exercise Vital Sign    Days of Exercise per Week: 0 days    Minutes of Exercise per Session: Not on file  Stress: No Stress Concern Present (03/27/2023)   [employer] of Occupational Health - Occupational Stress Questionnaire    Feeling of Stress : Only a little  Social Connections: Unknown (03/27/2023)   Social Connection and Isolation Panel [NHANES]    Frequency of Communication with Friends and Family: Twice a week    Frequency of Social Gatherings with Friends and Family: Twice a week    Attends Religious Services: Patient  declined    Database administrator or Organizations: No    Attends Engineer, structural: Not on file    Marital Status: Married  Intimate Partner Violence: Unknown (01/15/2023)   Received from [facility]   HITS    Physically Hurt: Not on file    Insult or Talk Down To: Not on file    Threaten Physical Harm: Not on file    Scream or Curse: Not on file       Review of Systems  Objective:   Physical Exam Vitals reviewed.  Constitutional:      General: He is not in acute distress.    Appearance: He is well-developed. He is not diaphoretic.  Cardiovascular:     Rate and Rhythm: Normal rate and regular rhythm.     Heart sounds: Normal heart sounds. No murmur heard. Pulmonary:     Effort: Pulmonary effort is normal. No respiratory distress.     Breath sounds: Normal breath sounds. No wheezing or rales.  Chest:     Chest wall: No tenderness.  Musculoskeletal:     Right knee: No effusion. Decreased range of motion. Tenderness present over the medial joint line and lateral joint line.     Left knee: No effusion. Decreased range of motion. Tenderness present over the medial joint line and lateral joint line.         Chronic pain of both knees   Using sterile technique, I injected the right knee with 2 cc of lidocaine, 2 cc of Marcaine, and 2 cc of 40 mg/mL Kenalog.  The patient tolerated this injection well without complication.  I repeated the exact same injection in the left knee without complication.

## 2023-04-09 ENCOUNTER — Encounter: Payer: Self-pay | Admitting: *Deleted

## 2023-04-12 MED ORDER — BUPIVACAINE HCL 0.25 % IJ SOLN
2.0000 mL | Freq: Once | INTRAMUSCULAR | Status: AC
  Administered 2023-04-01: 2 mL via INTRA_ARTICULAR

## 2023-04-12 MED ORDER — LIDOCAINE HCL (PF) 1 % IJ SOLN
2.0000 mL | Freq: Once | INTRAMUSCULAR | Status: AC
  Administered 2023-04-01: 2 mL

## 2023-04-12 MED ORDER — BUPIVACAINE HCL 0.25 % IJ SOLN
2.0000 mL | Freq: Once | INTRAMUSCULAR | Status: AC
  Administered 2023-04-11: 2 mL via INTRA_ARTICULAR

## 2023-04-12 MED ORDER — TRIAMCINOLONE ACETONIDE 40 MG/ML IJ SUSP
80.0000 mg | Freq: Once | INTRAMUSCULAR | Status: AC
  Administered 2023-04-01: 80 mg via INTRAMUSCULAR

## 2023-04-12 NOTE — Addendum Note (Signed)
Addended by: Venia Carbon K on: 04/12/2023 10:06 AM   Modules accepted: Orders

## 2023-04-14 ENCOUNTER — Encounter: Payer: Self-pay | Admitting: Family Medicine

## 2023-04-15 ENCOUNTER — Other Ambulatory Visit: Payer: Self-pay | Admitting: Family Medicine

## 2023-04-15 MED ORDER — VALACYCLOVIR HCL 1 G PO TABS: 1000.0000 mg | ORAL_TABLET | Freq: Three times a day (TID) | ORAL | 0 refills | Status: DC

## 2023-04-16 ENCOUNTER — Ambulatory Visit: Payer: BC Managed Care – PPO | Admitting: Family Medicine

## 2023-04-23 ENCOUNTER — Other Ambulatory Visit: Payer: Self-pay | Admitting: Family Medicine

## 2023-04-23 ENCOUNTER — Encounter: Payer: Self-pay | Admitting: Family Medicine

## 2023-04-23 MED ORDER — HYDROCODONE-ACETAMINOPHEN 5-325 MG PO TABS: 1.0000 | ORAL_TABLET | Freq: Four times a day (QID) | ORAL | 0 refills | Status: DC | PRN

## 2023-05-13 ENCOUNTER — Other Ambulatory Visit (HOSPITAL_COMMUNITY): Payer: Self-pay

## 2023-05-13 ENCOUNTER — Telehealth: Payer: Self-pay | Admitting: Pharmacy Technician

## 2023-05-13 NOTE — Telephone Encounter (Signed)
Pharmacy Patient Advocate Encounter  Insurance verification completed.    The patient is insured through [facility]   Ran test claim for repatha. Currently a quantity of is a 28 day supply and the co-pay is $35.00 .   This test claim was processed through [facility]- copay amounts may vary at other pharmacies due to pharmacy/plan contracts, or as the patient moves through the different stages of their insurance plan.

## 2023-06-06 DIAGNOSIS — I83819 Varicose veins of unspecified lower extremities with pain: Secondary | ICD-10-CM | POA: Diagnosis not present

## 2023-06-10 ENCOUNTER — Telehealth: Payer: Self-pay | Admitting: Pharmacy Technician

## 2023-06-10 ENCOUNTER — Other Ambulatory Visit (HOSPITAL_COMMUNITY): Payer: Self-pay

## 2023-06-10 NOTE — Telephone Encounter (Signed)
Pharmacy Patient Advocate Encounter   Received notification from CoverMyMeds that prior authorization for repatha is required/requested.   Insurance verification completed.   The patient is insured through [facility] .   Per test claim: PA required; PA started via CoverMyMeds. KEY [id number] . Waiting for clinical questions to populate.

## 2023-06-10 NOTE — Telephone Encounter (Signed)
Pharmacy Patient Advocate Encounter  Received notification from [facility] that Prior Authorization for repatha has been APPROVED from 06/10/23 to 06/09/24   PA #/Case ID/Reference #: [id number]

## 2023-06-13 ENCOUNTER — Ambulatory Visit: Payer: BC Managed Care – PPO | Admitting: Family Medicine

## 2023-06-13 ENCOUNTER — Ambulatory Visit (HOSPITAL_COMMUNITY)
Admission: RE | Admit: 2023-06-13 | Discharge: 2023-06-13 | Disposition: A | Discharge status: Home / Self Care | Payer: BC Managed Care – PPO | Source: Ambulatory Visit | Attending: Family Medicine | Admitting: Family Medicine

## 2023-06-13 VITALS — BP 120/78 | HR 67 | Temp 97.6°F | Ht 73.0 in | Wt 312.0 lb

## 2023-06-13 DIAGNOSIS — R0989 Other specified symptoms and signs involving the circulatory and respiratory systems: Secondary | ICD-10-CM

## 2023-06-13 DIAGNOSIS — R918 Other nonspecific abnormal finding of lung field: Secondary | ICD-10-CM | POA: Diagnosis not present

## 2023-06-13 NOTE — Progress Notes (Signed)
Subjective:    Patient ID: [name], male    DOB: [birth year], 67 y.o.   MRN: [id number]  Patient states that he developed a cough just questionable weeks ago.  Initially he was been sick but he feels much better now.  He still has a persistent cough that will not go away.  He denies any sputum production.  He denies any fevers or chills.  He denies any chest pain or pleurisy.  He does report some occasional wheezing.  He also has congestion and pressure in his right ear.  He states that his right ear feels stopped up.  Past Medical History:  Diagnosis Date   Arthritis of knee    seeing Dr. Farris Has   Cancer [facility])    Lung   Coronary artery disease    Depression with anxiety    hx of   Exogenous obesity    History of sleep apnea    Hyperlipidemia    Hypertension    Panic attacks    hx of   Sleep apnea    Uses a Cpap   Smoker unmotivated to quit    Varicose veins    Past Surgical History:  Procedure Laterality Date   CARDIAC CATHETERIZATION  09/06/2008   stent to mid LAD and proximal LAD   COLONOSCOPY N/A 05/01/2013   Procedure: COLONOSCOPY;  Surgeon: West Bali, MD;  [facility];  Service: Endoscopy;  Laterality: N/A;  8:30 AM   EYE SURGERY  2006   Lasik on both eyes   INTERCOSTAL NERVE BLOCK Left 03/09/2020   Procedure: INTERCOSTAL NERVE BLOCK;  Surgeon: Corliss Skains, MD;  Location: [facility] OR;  Service: Thoracic;  Laterality: Left;   LOBECTOMY  03/09/2020   XI ROBOTIC ASSISTED THORASCOPY - LOWER LEFT LOBE (LLL) WEDGE RESECTION AND LLL LOBECTOMY (Left)   LYMPH NODE DISSECTION N/A 03/09/2020   Procedure: LYMPH NODE DISSECTION;  Surgeon: Corliss Skains, MD;  Location: [facility] OR;  Service: Thoracic;  Laterality: N/A;   polyps of lung  2008   removed by [facility]   TONSILLECTOMY     VIDEO BRONCHOSCOPY N/A 03/09/2020   Procedure: VIDEO BRONCHOSCOPY;  Surgeon: Corliss Skains, MD;  Location: [facility] OR;  Service: Thoracic;  Laterality: N/A;    Current Outpatient Medications on File Prior to Visit  Medication Sig Dispense Refill   valACYclovir (VALTREX) 1000 MG tablet Take 1 tablet (1,000 mg total) by mouth 3 (three) times daily. 21 tablet 0   albuterol (VENTOLIN HFA) 108 (90 Base) MCG/ACT inhaler Inhale 1-2 puffs into the lungs every 6 (six) hours as needed for wheezing or shortness of breath. 18 g 0   carboxymethylcellul-glycerin (OPTIVE) 0.5-0.9 % ophthalmic solution Place 1 drop into both eyes daily as needed for dry eyes. refresh     clopidogrel (PLAVIX) 75 MG tablet TAKE 1 TABLET(75 MG) BY MOUTH DAILY 30 tablet 11   fluticasone (FLONASE) 50 MCG/ACT nasal spray Place 2 sprays into both nostrils daily. 16 g 6   furosemide (LASIX) 20 MG tablet Take 1 tablet (20 mg total) by mouth daily as needed (shortness of breath). 30 tablet 11   guaiFENesin (MUCINEX) 600 MG 12 hr tablet Take 1 tablet (600 mg total) by mouth 2 (two) times daily as needed for cough or to loosen phlegm. 30 tablet 0   hydrochlorothiazide (HYDRODIURIL) 25 MG tablet Take 1 tablet (25 mg total) by mouth daily as needed (edema). 30 tablet 1   HYDROcodone-acetaminophen (NORCO) 5-325  MG tablet Take 1 tablet by mouth every 6 (six) hours as needed for moderate pain. 30 tablet 0   levocetirizine (XYZAL ALLERGY 24HR) 5 MG tablet Take 1 tablet (5 mg total) by mouth every evening. 30 tablet 3   losartan (COZAAR) 100 MG tablet TAKE 1 TABLET(100 MG) BY MOUTH DAILY 90 tablet 2   metoprolol succinate (TOPROL-XL) 25 MG 24 hr tablet TAKE 1/2 TABLET BY MOUTH ONCE DAILY. 45 tablet 1   nitroGLYCERIN (NITROSTAT) 0.4 MG SL tablet Place 1 tablet (0.4 mg total) under the tongue every 5 (five) minutes as needed for chest pain. 25 tablet 5   PARoxetine (PAXIL) 40 MG tablet TAKE (1) TABLET BY MOUTH ONCE DAILY. 90 tablet 1   REPATHA SURECLICK 140 MG/ML SOAJ ADMINISTER 1 ML UNDER THE SKIN EVERY 14 DAYS 2 mL 11   Current Facility-Administered Medications on File Prior to Visit  Medication Dose  Route Frequency Provider Last Rate Last Admin   albuterol (PROVENTIL) (2.5 MG/3ML) 0.083% nebulizer solution 2.5 mg  2.5 mg Nebulization Once Valentino Nose, NP       No Known Allergies Social History   Socioeconomic History   Marital status: Married    Spouse name: Not on file   Number of children: Not on file   Years of education: Not on file   Highest education level: Associate degree: occupational, Scientist, product/process development, or vocational program  Occupational History   Occupation: [employer]  Tobacco Use   Smoking status: Every Day    Current packs/day: 0.50    Average packs/day: 0.5 packs/day for 51.8 years (25.9 ttl pk-yrs)    Types: Cigarettes    Start date: 1973   Smokeless tobacco: Never  Vaping Use   Vaping status: Never Used  Substance and Sexual Activity   Alcohol use: Yes    Alcohol/week: 0.0 standard drinks of alcohol    Comment: occasionally   Drug use: No   Sexual activity: Not on file  Other Topics Concern   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   Social Determinants of Health   Financial Resource Strain: Low Risk  (06/11/2023)   Overall Financial Resource Strain (CARDIA)    Difficulty of Paying Living Expenses: Not hard at all  Food Insecurity: No Food Insecurity (06/11/2023)   Hunger Vital Sign    Worried About Running Out of Food in the Last Year: Never true    Ran Out of Food in the Last Year: Never true  Transportation Needs: No Transportation Needs (06/11/2023)   PRAPARE - Administrator, Civil Service (Medical): No    Lack of Transportation (Non-Medical): No  Physical Activity: Unknown (06/11/2023)   Exercise Vital Sign    Days of Exercise per Week: 0 days    Minutes of Exercise per Session: Not on file  Stress: No Stress Concern Present (06/11/2023)   [employer] of Occupational Health - Occupational Stress Questionnaire    Feeling of Stress : Not at all  Social Connections:  Moderately Isolated (06/11/2023)   Social Connection and Isolation Panel [NHANES]    Frequency of Communication with Friends and Family: More than three times a week    Frequency of Social Gatherings with Friends and Family: More than three times a week    Attends Religious Services: Never    Database administrator or Organizations: No    Attends Banker Meetings: Not on file  Marital Status: Married  Catering manager Violence: Not At Risk (06/04/2023)   Received from [facility]   HITS    Over the last 12 months how often did your partner physically hurt you?: 1    Over the last 12 months how often did your partner insult you or talk down to you?: 1    Over the last 12 months how often did your partner threaten you with physical harm?: 1    Over the last 12 months how often did your partner scream or curse at you?: 1       Review of Systems  Respiratory:  Positive for cough.        Objective:   Physical Exam Vitals reviewed.  Constitutional:      General: He is not in acute distress.    Appearance: He is well-developed. He is not diaphoretic.  HENT:     Right Ear: Tympanic membrane and ear canal normal.     Left Ear: Tympanic membrane and ear canal normal.     Nose: Mucosal edema present. No congestion or rhinorrhea.     Right Turbinates: Swollen.     Left Turbinates: Enlarged and swollen.     Right Sinus: Maxillary sinus tenderness and frontal sinus tenderness present.     Mouth/Throat:     Pharynx: No oropharyngeal exudate.  Cardiovascular:     Rate and Rhythm: Normal rate and regular rhythm.     Heart sounds: Normal heart sounds. No murmur heard. Pulmonary:     Effort: Pulmonary effort is normal. No respiratory distress.     Breath sounds: Examination of the right-lower field reveals rales. Rales present. No wheezing or rhonchi.    Chest:     Chest wall: No tenderness.  Musculoskeletal:     Cervical back: Neck supple.  Lymphadenopathy:      Cervical: No cervical adenopathy.           Lung crackles - Plan: DG Chest 2 View Clinically, patient appears well today.  However I am taken aback by the abnormal breath sounds in the right lung.  His physical exam findings does not correlate with his clinical presentation.  Therefore I recommended an x-ray to evaluate for the cause of the abnormal breath sounds.  Await the results of the x-ray.

## 2023-06-14 ENCOUNTER — Telehealth: Payer: Self-pay

## 2023-06-14 NOTE — Telephone Encounter (Signed)
Donita Brooks, MD  Darral Dash, LPN I looked at CXR and I don't see anything wrong.  Suspect scar tissue from previous covid is causing crackles.  Awaiting radiologist read.  10/18 @ 9:05 am-LM for pt to call office to discuss. My Chart message also sent. Mjp,lpn

## 2023-06-20 DIAGNOSIS — I83811 Varicose veins of right lower extremities with pain: Secondary | ICD-10-CM | POA: Diagnosis not present

## 2023-07-03 NOTE — Progress Notes (Signed)
Date:  07/10/2023   ID:  [name], [name] [birth year], MRN [id number]  PCP:  Donita Brooks, MD  Cardiologist:  Charlton Haws, MD   Electrophysiologist:  None   Evaluation Performed:  Follow-Up Visit  Chief Complaint:  CAD/ HLD   History of Present Illness:    67 y.o. 2010 had stenting proximal and mid LAD with DES by Dr [place]. Non ischemic myovue 04/24/16 Had myalgias on crestor and lipitor Now on Repatha  Varicose veins post ablative Rx by Dr Hart [place]. 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 Smoker over 45 pack year not interested in Chantix   Helping to raise 63 yo grandson   Has some sciatica Rx with prednisone improved   Last visit we ordered lung cancer screening CT which identified LLL cancer He is no post LLL lobectomy by Dr Cliffton Asters Stage 1A no metastatic disease   Wearing CPAP Supposed to have swallow study for dysphagia   No angina still working with [employer] at [facility]  Finally saw [facility] in [place] and had ablative Rx of his veins with improvement Discussed Ozempic for short term to lose weight as he may need knee replacements next year  Recent shingles over left side   Has nitroglycerin that's less than a year old    Past Medical History:  Diagnosis Date   Arthritis of knee    seeing Dr. Farris Has   Cancer [facility])    Lung   Coronary artery disease    Depression with anxiety    hx of   Exogenous obesity    History of sleep apnea    Hyperlipidemia    Hypertension    Panic attacks    hx of   Sleep apnea    Uses a Cpap   Smoker unmotivated to quit    Varicose veins    Past Surgical History:  Procedure Laterality Date   CARDIAC CATHETERIZATION  09/06/2008   stent to mid LAD and proximal LAD   COLONOSCOPY N/A 05/01/2013   Procedure: COLONOSCOPY;  Surgeon: West Bali, MD;  [facility];  Service: Endoscopy;  Laterality: N/A;  8:30 AM   EYE SURGERY  2006   Lasik on both eyes   INTERCOSTAL NERVE BLOCK Left 03/09/2020   Procedure:  INTERCOSTAL NERVE BLOCK;  Surgeon: Corliss Skains, MD;  Location: [facility] OR;  Service: Thoracic;  Laterality: Left;   LOBECTOMY  03/09/2020   XI ROBOTIC ASSISTED THORASCOPY - LOWER LEFT LOBE (LLL) WEDGE RESECTION AND LLL LOBECTOMY (Left)   LYMPH NODE DISSECTION N/A 03/09/2020   Procedure: LYMPH NODE DISSECTION;  Surgeon: Corliss Skains, MD;  Location: [facility];  Service: Thoracic;  Laterality: N/A;   polyps of lung  2008   removed by [facility]   TONSILLECTOMY     VIDEO BRONCHOSCOPY N/A 03/09/2020   Procedure: VIDEO BRONCHOSCOPY;  Surgeon: Corliss Skains, MD;  Location: [facility];  Service: Thoracic;  Laterality: N/A;     Current Meds  Medication Sig   albuterol (VENTOLIN HFA) 108 (90 Base) MCG/ACT inhaler Inhale 1-2 puffs into the lungs every 6 (six) hours as needed for wheezing or shortness of breath.   carboxymethylcellul-glycerin (OPTIVE) 0.5-0.9 % ophthalmic solution Place 1 drop into both eyes daily as needed for dry eyes. refresh   clopidogrel (PLAVIX) 75 MG tablet TAKE 1 TABLET(75 MG) BY MOUTH DAILY   fluticasone (FLONASE) 50 MCG/ACT nasal spray Place 2 sprays into both nostrils daily.   furosemide (  LASIX) 20 MG tablet Take 1 tablet (20 mg total) by mouth daily as needed (shortness of breath).   hydrochlorothiazide (HYDRODIURIL) 25 MG tablet Take 1 tablet (25 mg total) by mouth daily as needed (edema).   levocetirizine (XYZAL ALLERGY 24HR) 5 MG tablet Take 1 tablet (5 mg total) by mouth every evening.   losartan (COZAAR) 100 MG tablet TAKE 1 TABLET(100 MG) BY MOUTH DAILY   metoprolol succinate (TOPROL-XL) 25 MG 24 hr tablet TAKE 1/2 TABLET BY MOUTH ONCE DAILY.   nitroGLYCERIN (NITROSTAT) 0.4 MG SL tablet Place 1 tablet (0.4 mg total) under the tongue every 5 (five) minutes as needed for chest pain.   PARoxetine (PAXIL) 40 MG tablet TAKE (1) TABLET BY MOUTH ONCE DAILY.   REPATHA SURECLICK 140 MG/ML SOAJ ADMINISTER 1 ML UNDER THE SKIN EVERY 14 DAYS   valACYclovir (VALTREX)  1000 MG tablet Take 1 tablet (1,000 mg total) by mouth 3 (three) times daily.   Current Facility-Administered Medications for the 07/10/23 encounter (Office Visit) with Wendall Stade, MD  Medication   albuterol (PROVENTIL) (2.5 MG/3ML) 0.083% nebulizer solution 2.5 mg     Allergies:   Patient has no known allergies.   Social History   Tobacco Use   Smoking status: Every Day    Current packs/day: 0.50    Average packs/day: 0.5 packs/day for 51.9 years (25.9 ttl pk-yrs)    Types: Cigarettes    Start date: 1973   Smokeless tobacco: Never  Vaping Use   Vaping status: Never Used  Substance Use Topics   Alcohol use: Yes    Alcohol/week: 0.0 standard drinks of alcohol    Comment: occasionally   Drug use: No     Family Hx: The patient's family history includes Esophageal cancer (age of onset: 44) in his father; Heart disease in his father; Lung cancer in his brother. There is no history of Colon cancer.  ROS:   Please see the history of present illness.     All other systems reviewed and are negative.   Prior CV studies:   The following studies were reviewed today:  Myovue 04/24/16  Labs/Other Tests and Data Reviewed:    EKG:  10/31/17 SR rate 68 normal  12/22/19 SR rate 59 normal 07/10/2023 SR normal rate 68  Recent Labs: No results found for requested labs within last 365 days.   Recent Lipid Panel Lab Results  Component Value Date/Time   CHOL 117 06/15/2022 08:51 AM   CHOL 151 12/31/2017 08:20 AM   TRIG 97 06/15/2022 08:51 AM   HDL 39 (L) 06/15/2022 08:51 AM   HDL 31 (L) 12/31/2017 08:20 AM   CHOLHDL 3.0 06/15/2022 08:51 AM   LDLCALC 60 06/15/2022 08:51 AM   LDLDIRECT 178 (H) 11/18/2017 04:28 PM    Wt Readings from Last 3 Encounters:  07/10/23 (!) 316 lb 9.6 oz (143.6 kg)  06/13/23 (!) 312 lb (141.5 kg)  04/01/23 (!) 314 lb 9.6 oz (142.7 kg)     Objective:    Vital Signs:  BP (!) 142/80 (BP Location: Left Arm, Patient Position: Sitting, Cuff Size: Large)    Pulse 67   Resp 14   Ht 6\' 1"  (1.854 m)   Wt (!) 316 lb 9.6 oz (143.6 kg)   SpO2 96%   BMI 41.77 kg/m    Affect appropriate Obese white male  HEENT: normal Neck supple with no adenopathy JVP normal no bruits no thyromegaly Lungs clear with no wheezing and good diaphragmatic motionpost LLL  lobectomy  S1/S2 no murmur, no rub, gallop or click PMI normal Abdomen: benighn, BS positve, no tenderness, no AAA no bruit.  No HSM or HJR Distal pulses intact with no bruits LE varicosities right >left  improved with compression hose on now Neuro non-focal Skin warm and dry No muscular weakness   ASSESSMENT & PLAN:    1. CAD: stenting LAD 2010 non ischemic myovue 2017 no chest pain continue medical Rx 2. HLD:  LDL 79 continue Repatha   3. Obesity:  Discussed low carb diet and portion control Ozempic per primary if weight loss needed before TKR;s 4.  Bilateral varicose veins.  F/U [facility] post ablative Rx with chronic stasis no ulcers improved continue lasix  5. Ortho: post arthroscopic surgery on left with improvement f/u Dr Wandra Feinstein Still may need TKRls next year  6. Smoking:  lung cancer CT 01/13/20 with 8 mm lesion LLL, positive PET now post LLL lobectomy 03/09/20  f/u pulmonary and oncology CT 11/16/22 no recurrent cancer.  Medication Adjustments/Labs and Tests Ordered: Current medicines are reviewed at length with the patient today.  Concerns regarding medicines are outlined above.   Tests Ordered:  None   Medication Changes: No orders of the defined types were placed in this encounter.    Disposition:  Follow up in a year   Signed, Charlton Haws, MD  07/10/2023 8:54 AM    [facility]

## 2023-07-04 DIAGNOSIS — I83812 Varicose veins of left lower extremities with pain: Secondary | ICD-10-CM | POA: Diagnosis not present

## 2023-07-10 ENCOUNTER — Ambulatory Visit: Payer: BC Managed Care – PPO | Attending: Cardiovascular Disease | Admitting: Cardiovascular Disease

## 2023-07-10 ENCOUNTER — Encounter: Payer: Self-pay | Admitting: Cardiovascular Disease

## 2023-07-10 VITALS — BP 142/80 | HR 67 | Resp 14 | Ht 73.0 in | Wt 316.6 lb

## 2023-07-10 DIAGNOSIS — E785 Hyperlipidemia, unspecified: Secondary | ICD-10-CM

## 2023-07-10 DIAGNOSIS — I8393 Asymptomatic varicose veins of bilateral lower extremities: Secondary | ICD-10-CM | POA: Diagnosis not present

## 2023-07-10 DIAGNOSIS — I2510 Atherosclerotic heart disease of native coronary artery without angina pectoris: Secondary | ICD-10-CM

## 2023-07-10 NOTE — Patient Instructions (Signed)
Medication Instructions:  No changes *If you need a refill on your cardiac medications before your next appointment, please call your pharmacy*   Lab Work: none If you have labs (blood work) drawn today and your tests are completely normal, you will receive your results only by: MyChart Message (if you have MyChart) OR A paper copy in the mail If you have any lab test that is abnormal or we need to change your treatment, we will call you to review the results.   Testing/Procedures: none   Follow-Up: At [facility], you and your health needs are our priority.  As part of our continuing mission to provide you with exceptional heart care, we have created designated Provider Care Teams.  These Care Teams include your primary Cardiologist (physician) and Advanced Practice Providers (APPs -  Physician Assistants and Nurse Practitioners) who all work together to provide you with the care you need, when you need it.   Your next appointment:   12 month(s)  Provider:   Charlton Haws, MD

## 2023-07-31 ENCOUNTER — Telehealth: Payer: Self-pay | Admitting: Family Medicine

## 2023-07-31 NOTE — Telephone Encounter (Signed)
Patient wife called in wanting to see if patient can be seen  sooner for his cortisone shot for both knees patient knee's are bothering him and he has a ongoing cough as well

## 2023-08-01 ENCOUNTER — Encounter: Payer: Self-pay | Admitting: Family Medicine

## 2023-08-02 ENCOUNTER — Other Ambulatory Visit: Payer: Self-pay | Admitting: Family Medicine

## 2023-08-02 MED ORDER — AZITHROMYCIN 250 MG PO TABS: ORAL_TABLET | ORAL | 0 refills | Status: DC

## 2023-08-08 ENCOUNTER — Ambulatory Visit: Payer: BC Managed Care – PPO | Admitting: Family Medicine

## 2023-08-08 ENCOUNTER — Encounter: Payer: Self-pay | Admitting: Family Medicine

## 2023-08-08 VITALS — BP 126/72 | HR 71 | Temp 97.8°F | Ht 73.0 in | Wt 315.8 lb

## 2023-08-08 DIAGNOSIS — M25561 Pain in right knee: Secondary | ICD-10-CM

## 2023-08-08 DIAGNOSIS — L03012 Cellulitis of left finger: Secondary | ICD-10-CM

## 2023-08-08 DIAGNOSIS — M25562 Pain in left knee: Secondary | ICD-10-CM

## 2023-08-08 DIAGNOSIS — G8929 Other chronic pain: Secondary | ICD-10-CM | POA: Diagnosis not present

## 2023-08-08 MED ORDER — SULFAMETHOXAZOLE-TRIMETHOPRIM 800-160 MG PO TABS: 1.0000 | ORAL_TABLET | Freq: Two times a day (BID) | ORAL | 0 refills | Status: DC

## 2023-08-08 NOTE — Progress Notes (Signed)
Subjective:    Patient ID: [name], male    DOB: 1955/09/18, 67 y.o.   MRN: [id number] Patient has bilateral knee pain.  He has end-stage osteoarthritis of both knees.  He has been receiving cortisone injections.  Last injection of both knees was in August.  His knees are causing constant pain.  He is requesting a cortisone injection in both knees.  He is unable to take NSAIDs due to his history with coronary artery disease and antiplatelet therapy.  Patient also has an infection on his left index finger.  The skin around the nail is erythematous and swollen.  There is a small collection of blood under the skin on the radial aspect of the nail.  The patient states he tried to lance that last evening with a needle.  He was unable to get any pus other than just some blood.  Today there is no visible pus.  However the skin is red swollen and tender Past Medical History:  Diagnosis Date   Arthritis of knee    seeing Dr. Farris Has   Cancer [facility])    Lung   Coronary artery disease    Depression with anxiety    hx of   Exogenous obesity    History of sleep apnea    Hyperlipidemia    Hypertension    Panic attacks    hx of   Sleep apnea    Uses a Cpap   Smoker unmotivated to quit    Varicose veins    Past Surgical History:  Procedure Laterality Date   CARDIAC CATHETERIZATION  09/06/2008   stent to mid LAD and proximal LAD   COLONOSCOPY N/A 05/01/2013   Procedure: COLONOSCOPY;  Surgeon: West Bali, MD;  [facility];  Service: Endoscopy;  Laterality: N/A;  8:30 AM   EYE SURGERY  2006   Lasik on both eyes   INTERCOSTAL NERVE BLOCK Left 03/09/2020   Procedure: INTERCOSTAL NERVE BLOCK;  Surgeon: Corliss Skains, MD;  Location: [facility] OR;  Service: Thoracic;  Laterality: Left;   LOBECTOMY  03/09/2020   XI ROBOTIC ASSISTED THORASCOPY - LOWER LEFT LOBE (LLL) WEDGE RESECTION AND LLL LOBECTOMY (Left)   LYMPH NODE DISSECTION N/A 03/09/2020   Procedure: LYMPH NODE DISSECTION;   Surgeon: Corliss Skains, MD;  Location: [facility];  Service: Thoracic;  Laterality: N/A;   polyps of lung  2008   removed by [facility]   TONSILLECTOMY     VIDEO BRONCHOSCOPY N/A 03/09/2020   Procedure: VIDEO BRONCHOSCOPY;  Surgeon: Corliss Skains, MD;  Location: [facility];  Service: Thoracic;  Laterality: N/A;   Current Outpatient Medications on File Prior to Visit  Medication Sig Dispense Refill   albuterol (VENTOLIN HFA) 108 (90 Base) MCG/ACT inhaler Inhale 1-2 puffs into the lungs every 6 (six) hours as needed for wheezing or shortness of breath. 18 g 0   azithromycin (ZITHROMAX) 250 MG tablet 2 tabs poqday1, 1 tab poqday 2-5 6 tablet 0   carboxymethylcellul-glycerin (OPTIVE) 0.5-0.9 % ophthalmic solution Place 1 drop into both eyes daily as needed for dry eyes. refresh     clopidogrel (PLAVIX) 75 MG tablet TAKE 1 TABLET(75 MG) BY MOUTH DAILY 30 tablet 11   fluticasone (FLONASE) 50 MCG/ACT nasal spray Place 2 sprays into both nostrils daily. 16 g 6   furosemide (LASIX) 20 MG tablet Take 1 tablet (20 mg total) by mouth daily as needed (shortness of breath). 30 tablet 11   hydrochlorothiazide (HYDRODIURIL)  25 MG tablet Take 1 tablet (25 mg total) by mouth daily as needed (edema). 30 tablet 1   levocetirizine (XYZAL ALLERGY 24HR) 5 MG tablet Take 1 tablet (5 mg total) by mouth every evening. 30 tablet 3   losartan (COZAAR) 100 MG tablet TAKE 1 TABLET(100 MG) BY MOUTH DAILY 90 tablet 2   metoprolol succinate (TOPROL-XL) 25 MG 24 hr tablet TAKE 1/2 TABLET BY MOUTH ONCE DAILY. 45 tablet 1   nitroGLYCERIN (NITROSTAT) 0.4 MG SL tablet Place 1 tablet (0.4 mg total) under the tongue every 5 (five) minutes as needed for chest pain. 25 tablet 5   PARoxetine (PAXIL) 40 MG tablet TAKE (1) TABLET BY MOUTH ONCE DAILY. 90 tablet 1   REPATHA SURECLICK 140 MG/ML SOAJ ADMINISTER 1 ML UNDER THE SKIN EVERY 14 DAYS 2 mL 11   valACYclovir (VALTREX) 1000 MG tablet Take 1 tablet (1,000 mg total) by mouth 3  (three) times daily. 21 tablet 0   Current Facility-Administered Medications on File Prior to Visit  Medication Dose Route Frequency Provider Last Rate Last Admin   albuterol (PROVENTIL) (2.5 MG/3ML) 0.083% nebulizer solution 2.5 mg  2.5 mg Nebulization Once Valentino Nose, NP       No Known Allergies Social History   Socioeconomic History   Marital status: Married    Spouse name: Not on file   Number of children: Not on file   Years of education: Not on file   Highest education level: Associate degree: occupational, Scientist, product/process development, or vocational program  Occupational History   Occupation: [employer]  Tobacco Use   Smoking status: Every Day    Current packs/day: 0.50    Average packs/day: 0.5 packs/day for 51.9 years (26.0 ttl pk-yrs)    Types: Cigarettes    Start date: 1973   Smokeless tobacco: Never  Vaping Use   Vaping status: Never Used  Substance and Sexual Activity   Alcohol use: Yes    Alcohol/week: 0.0 standard drinks of alcohol    Comment: occasionally   Drug use: No   Sexual activity: Not on file  Other Topics Concern   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   Social Drivers of Corporate investment banker Strain: Low Risk  (06/11/2023)   Overall Financial Resource Strain (CARDIA)    Difficulty of Paying Living Expenses: Not hard at all  Food Insecurity: No Food Insecurity (06/11/2023)   Hunger Vital Sign    Worried About Running Out of Food in the Last Year: Never true    Ran Out of Food in the Last Year: Never true  Transportation Needs: No Transportation Needs (06/11/2023)   PRAPARE - Administrator, Civil Service (Medical): No    Lack of Transportation (Non-Medical): No  Physical Activity: Unknown (06/11/2023)   Exercise Vital Sign    Days of Exercise per Week: 0 days    Minutes of Exercise per Session: Not on file  Stress: No Stress Concern Present (06/11/2023)   [employer] of  Occupational Health - Occupational Stress Questionnaire    Feeling of Stress : Not at all  Social Connections: Moderately Isolated (06/11/2023)   Social Connection and Isolation Panel [NHANES]    Frequency of Communication with Friends and Family: More than three times a week    Frequency of Social Gatherings with Friends and Family: More than three times a week    Attends Religious Services: Never  Active Member of Clubs or Organizations: No    Attends Banker Meetings: Not on file    Marital Status: Married  Intimate Partner Violence: Not At Risk (06/04/2023)   Received from [facility]   HITS    Over the last 12 months how often did your partner physically hurt you?: Never    Over the last 12 months how often did your partner insult you or talk down to you?: Never    Over the last 12 months how often did your partner threaten you with physical harm?: Never    Over the last 12 months how often did your partner scream or curse at you?: Never       Review of Systems     Objective:   Physical Exam Vitals reviewed.  Constitutional:      General: He is not in acute distress.    Appearance: He is well-developed. He is not diaphoretic.  Cardiovascular:     Rate and Rhythm: Normal rate and regular rhythm.     Heart sounds: Normal heart sounds. No murmur heard. Pulmonary:     Effort: Pulmonary effort is normal. No respiratory distress.     Breath sounds: Normal breath sounds. No wheezing or rales.  Chest:     Chest wall: No tenderness.  Musculoskeletal:     Left hand: Swelling and tenderness present.     Right knee: No effusion. Decreased range of motion. Tenderness present over the medial joint line and lateral joint line.     Left knee: No effusion. Decreased range of motion. Tenderness present over the medial joint line and lateral joint line.  Skin:    Findings: Erythema present.          Chronic pain of both knees  Paronychia of finger of left  hand   Using sterile technique, I injected the right knee with 2 cc of lidocaine, 2 cc of Marcaine, and 2 cc of 40 mg/mL Kenalog.  The patient tolerated this injection well without complication.  I repeated the exact same injection in the left knee without complication.  I believe the patient has an evolving paronychia on his left index finger.  There is no visible pus to drain today.  We will start the patient on Bactrim double tablets twice daily for 7 days.  Recheck immediately if worsening.

## 2023-08-13 ENCOUNTER — Ambulatory Visit: Payer: BC Managed Care – PPO | Admitting: Family Medicine

## 2023-08-13 DIAGNOSIS — Z9889 Other specified postprocedural states: Secondary | ICD-10-CM | POA: Diagnosis not present

## 2023-08-13 DIAGNOSIS — M79604 Pain in right leg: Secondary | ICD-10-CM | POA: Diagnosis not present

## 2023-08-14 MED ORDER — TRIAMCINOLONE ACETONIDE 40 MG/ML IJ SUSP
80.0000 mg | Freq: Once | INTRAMUSCULAR | Status: AC
Start: 2023-08-14 — End: 2023-08-08
  Administered 2023-08-08: 80 mg via INTRA_ARTICULAR

## 2023-08-14 NOTE — Addendum Note (Signed)
Addended by: Venia Carbon K on: 08/14/2023 04:39 PM   Modules accepted: Orders

## 2023-08-16 ENCOUNTER — Ambulatory Visit: Payer: BC Managed Care – PPO | Admitting: Family Medicine

## 2023-08-19 ENCOUNTER — Ambulatory Visit: Payer: BC Managed Care – PPO | Admitting: Family Medicine

## 2023-09-08 ENCOUNTER — Other Ambulatory Visit: Payer: Self-pay | Admitting: Family Medicine

## 2023-09-08 DIAGNOSIS — F410 Panic disorder [episodic paroxysmal anxiety] without agoraphobia: Secondary | ICD-10-CM

## 2023-09-10 NOTE — Telephone Encounter (Signed)
 Requested Prescriptions  Pending Prescriptions Disp Refills   PARoxetine  (PAXIL ) 40 MG tablet [Pharmacy Med Name: paroxetine  40 mg tablet] 90 tablet 1    Sig: TAKE (1) TABLET BY MOUTH ONCE DAILY.     Psychiatry:  Antidepressants - SSRI Failed - 09/10/2023 10:25 AM      Failed - Valid encounter within last 6 months    Recent Outpatient Visits           1 year ago Wheezing   [facility] Chandra Harlene LABOR, NP   2 years ago Rhinosinusitis   [facility] Duanne, Butler DASEN, MD   2 years ago COVID-19   [facility] Chandra Harlene LABOR, NP   3 years ago COVID-19   [facility] Pickard, Butler DASEN, MD   3 years ago General medical exam   [facility] [name], MD              Passed - Completed PHQ-2 or PHQ-9 in the last 360 days

## 2023-09-17 ENCOUNTER — Other Ambulatory Visit: Payer: Self-pay | Admitting: Cardiovascular Disease

## 2023-09-17 DIAGNOSIS — I2510 Atherosclerotic heart disease of native coronary artery without angina pectoris: Secondary | ICD-10-CM

## 2023-09-17 DIAGNOSIS — E785 Hyperlipidemia, unspecified: Secondary | ICD-10-CM

## 2023-09-26 ENCOUNTER — Encounter: Payer: Self-pay | Admitting: Family Medicine

## 2023-09-26 ENCOUNTER — Ambulatory Visit: Payer: BC Managed Care – PPO | Admitting: Family Medicine

## 2023-09-26 VITALS — BP 120/62 | HR 91 | Temp 99.4°F

## 2023-09-26 DIAGNOSIS — R509 Fever, unspecified: Secondary | ICD-10-CM | POA: Diagnosis not present

## 2023-09-26 LAB — INFLUENZA A AND B AG, IMMUNOASSAY
INFLUENZA A ANTIGEN: NOT DETECTED
INFLUENZA B ANTIGEN: NOT DETECTED

## 2023-09-26 MED ORDER — OSELTAMIVIR PHOSPHATE 75 MG PO CAPS: 75.0000 mg | ORAL_CAPSULE | Freq: Two times a day (BID) | ORAL | 0 refills | Status: DC

## 2023-09-26 NOTE — Progress Notes (Signed)
Subjective:    Patient ID: [name], male    DOB: [birth year], 68 y.o.   MRN: [id number]  Patient developed sudden onset of diffuse bodyaches 2 days ago.  He also reports fever to 101 and 102.  Last night it was 102.  He feels achy all over.  He has been coughing.  The cough is nonproductive.  He also reports sinus pressure and rhinorrhea.  He has a sore scratchy throat.  He has had numerous sick contacts at work. Past Medical History:  Diagnosis Date   Arthritis of knee    seeing Dr. Farris Has   Cancer [facility])    Lung   Coronary artery disease    Depression with anxiety    hx of   Exogenous obesity    History of sleep apnea    Hyperlipidemia    Hypertension    Panic attacks    hx of   Sleep apnea    Uses a Cpap   Smoker unmotivated to quit    Varicose veins    Past Surgical History:  Procedure Laterality Date   CARDIAC CATHETERIZATION  09/06/2008   stent to mid LAD and proximal LAD   COLONOSCOPY N/A 05/01/2013   Procedure: COLONOSCOPY;  Surgeon: West Bali, MD;  [facility];  Service: Endoscopy;  Laterality: N/A;  8:30 AM   EYE SURGERY  2006   Lasik on both eyes   INTERCOSTAL NERVE BLOCK Left 03/09/2020   Procedure: INTERCOSTAL NERVE BLOCK;  Surgeon: Corliss Skains, MD;  Location: [facility];  Service: Thoracic;  Laterality: Left;   LOBECTOMY  03/09/2020   XI ROBOTIC ASSISTED THORASCOPY - LOWER LEFT LOBE (LLL) WEDGE RESECTION AND LLL LOBECTOMY (Left)   LYMPH NODE DISSECTION N/A 03/09/2020   Procedure: LYMPH NODE DISSECTION;  Surgeon: Corliss Skains, MD;  Location: [facility];  Service: Thoracic;  Laterality: N/A;   polyps of lung  2008   removed by [facility]   TONSILLECTOMY     VIDEO BRONCHOSCOPY N/A 03/09/2020   Procedure: VIDEO BRONCHOSCOPY;  Surgeon: Corliss Skains, MD;  Location: [facility];  Service: Thoracic;  Laterality: N/A;   Current Outpatient Medications on File Prior to Visit  Medication Sig Dispense Refill   albuterol (VENTOLIN  HFA) 108 (90 Base) MCG/ACT inhaler Inhale 1-2 puffs into the lungs every 6 (six) hours as needed for wheezing or shortness of breath. 18 g 0   azithromycin (ZITHROMAX) 250 MG tablet 2 tabs poqday1, 1 tab poqday 2-5 6 tablet 0   carboxymethylcellul-glycerin (OPTIVE) 0.5-0.9 % ophthalmic solution Place 1 drop into both eyes daily as needed for dry eyes. refresh     clopidogrel (PLAVIX) 75 MG tablet TAKE 1 TABLET(75 MG) BY MOUTH DAILY 30 tablet 11   fluticasone (FLONASE) 50 MCG/ACT nasal spray Place 2 sprays into both nostrils daily. 16 g 6   furosemide (LASIX) 20 MG tablet Take 1 tablet (20 mg total) by mouth daily as needed (shortness of breath). 30 tablet 11   hydrochlorothiazide (HYDRODIURIL) 25 MG tablet Take 1 tablet (25 mg total) by mouth daily as needed (edema). 30 tablet 1   levocetirizine (XYZAL ALLERGY 24HR) 5 MG tablet Take 1 tablet (5 mg total) by mouth every evening. 30 tablet 3   losartan (COZAAR) 100 MG tablet TAKE 1 TABLET(100 MG) BY MOUTH DAILY 90 tablet 2   metoprolol succinate (TOPROL-XL) 25 MG 24 hr tablet TAKE 1/2 TABLET BY MOUTH ONCE DAILY. 45 tablet 1   nitroGLYCERIN (  NITROSTAT) 0.4 MG SL tablet Place 1 tablet (0.4 mg total) under the tongue every 5 (five) minutes as needed for chest pain. 25 tablet 5   PARoxetine (PAXIL) 40 MG tablet TAKE (1) TABLET BY MOUTH ONCE DAILY. 90 tablet 1   REPATHA SURECLICK 140 MG/ML SOAJ ADMINISTER 1 ML UNDER THE SKIN EVERY 14 DAYS. 2 mL 9   sulfamethoxazole-trimethoprim (BACTRIM DS) 800-160 MG tablet Take 1 tablet by mouth 2 (two) times daily. 14 tablet 0   valACYclovir (VALTREX) 1000 MG tablet Take 1 tablet (1,000 mg total) by mouth 3 (three) times daily. 21 tablet 0   Current Facility-Administered Medications on File Prior to Visit  Medication Dose Route Frequency Provider Last Rate Last Admin   albuterol (PROVENTIL) (2.5 MG/3ML) 0.083% nebulizer solution 2.5 mg  2.5 mg Nebulization Once Valentino Nose, NP       No Known Allergies Social  History   Socioeconomic History   Marital status: Married    Spouse name: Not on file   Number of children: Not on file   Years of education: Not on file   Highest education level: Associate degree: occupational, Scientist, product/process development, or vocational program  Occupational History   Occupation: [employer]  Tobacco Use   Smoking status: Every Day    Current packs/day: 0.50    Average packs/day: 0.5 packs/day for 52.1 years (26.0 ttl pk-yrs)    Types: Cigarettes    Start date: 1973   Smokeless tobacco: Never  Vaping Use   Vaping status: Never Used  Substance and Sexual Activity   Alcohol use: Yes    Alcohol/week: 0.0 standard drinks of alcohol    Comment: occasionally   Drug use: No   Sexual activity: Not on file  Other Topics Concern   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   Social Drivers of Health   Financial Resource Strain: Patient Declined (09/25/2023)   Overall Financial Resource Strain (CARDIA)    Difficulty of Paying Living Expenses: Patient declined  Food Insecurity: Patient Declined (09/25/2023)   Hunger Vital Sign    Worried About Running Out of Food in the Last Year: Patient declined    Ran Out of Food in the Last Year: Patient declined  Transportation Needs: Patient Declined (09/25/2023)   PRAPARE - Administrator, Civil Service (Medical): Patient declined    Lack of Transportation (Non-Medical): Patient declined  Physical Activity: Unknown (09/25/2023)   Exercise Vital Sign    Days of Exercise per Week: 0 days    Minutes of Exercise per Session: Not on file  Stress: No Stress Concern Present (09/25/2023)   [employer] of Occupational Health - Occupational Stress Questionnaire    Feeling of Stress : Only a little  Social Connections: Moderately Isolated (09/25/2023)   Social Connection and Isolation Panel [NHANES]    Frequency of Communication with Friends and Family: Twice a week    Frequency of Social  Gatherings with Friends and Family: More than three times a week    Attends Religious Services: Never    Database administrator or Organizations: No    Attends Banker Meetings: Not on file    Marital Status: Married  Intimate Partner Violence: Not At Risk (06/04/2023)   Received from [facility]   HITS    Over the last 12 months how often did your partner physically hurt you?: Never    Over the last 12  months how often did your partner insult you or talk down to you?: Never    Over the last 12 months how often did your partner threaten you with physical harm?: Never    Over the last 12 months how often did your partner scream or curse at you?: Never       Review of Systems  Respiratory:  Positive for cough.        Objective:   Physical Exam Vitals reviewed.  Constitutional:      General: He is not in acute distress.    Appearance: He is well-developed. He is not diaphoretic.  HENT:     Right Ear: Tympanic membrane and ear canal normal.     Left Ear: Tympanic membrane and ear canal normal.     Nose: Mucosal edema and congestion present. No rhinorrhea.     Right Turbinates: Swollen.     Left Turbinates: Enlarged and swollen.     Mouth/Throat:     Pharynx: No oropharyngeal exudate.  Cardiovascular:     Rate and Rhythm: Normal rate and regular rhythm.     Heart sounds: Normal heart sounds. No murmur heard. Pulmonary:     Effort: Pulmonary effort is normal. No respiratory distress.     Breath sounds: No wheezing, rhonchi or rales.  Chest:     Chest wall: No tenderness.  Musculoskeletal:     Cervical back: Neck supple.  Lymphadenopathy:     Cervical: No cervical adenopathy.           Fever, unspecified fever cause - Plan: Influenza A and B Ag, Immunoassay Clinically I believe the patient has influenza.  I will start the patient on Tamiflu 75 mg twice daily for 5 days.  I recommended that he go home and do a home COVID test to rule out COVID.  If the  COVID test is positive I would switch the patient to Paxlovid.

## 2023-10-09 ENCOUNTER — Other Ambulatory Visit: Payer: Self-pay | Admitting: Thoracic Surgery (Cardiothoracic Vascular Surgery)

## 2023-10-09 DIAGNOSIS — Z85118 Personal history of other malignant neoplasm of bronchus and lung: Secondary | ICD-10-CM

## 2023-11-12 ENCOUNTER — Ambulatory Visit
Admission: RE | Admit: 2023-11-12 | Discharge: 2023-11-12 | Disposition: A | Discharge status: Home / Self Care | Payer: BC Managed Care – PPO | Source: Ambulatory Visit | Attending: Thoracic Surgery (Cardiothoracic Vascular Surgery)

## 2023-11-12 DIAGNOSIS — Z85118 Personal history of other malignant neoplasm of bronchus and lung: Secondary | ICD-10-CM | POA: Diagnosis not present

## 2023-11-12 DIAGNOSIS — R911 Solitary pulmonary nodule: Secondary | ICD-10-CM | POA: Diagnosis not present

## 2023-11-12 DIAGNOSIS — Z902 Acquired absence of lung [part of]: Secondary | ICD-10-CM | POA: Diagnosis not present

## 2023-11-19 ENCOUNTER — Other Ambulatory Visit: Payer: BC Managed Care – PPO

## 2023-11-19 ENCOUNTER — Ambulatory Visit (INDEPENDENT_AMBULATORY_CARE_PROVIDER_SITE_OTHER): Payer: BC Managed Care – PPO

## 2023-11-19 DIAGNOSIS — C3492 Malignant neoplasm of unspecified part of left bronchus or lung: Secondary | ICD-10-CM | POA: Diagnosis not present

## 2023-11-19 NOTE — Patient Instructions (Signed)
 Repeat noncontrast CT in 1 year

## 2023-11-19 NOTE — Progress Notes (Signed)
 [address]       [name] [address]             [phone]     CARDIOTHORACIC SURGERY TELEPHONE VIRTUAL OFFICE NOTE  Referring Provider is Parrett, Virgel Bouquet, NP Primary Cardiologist is Charlton Haws, MD PCP is Donita Brooks, MD   HPI:  I spoke with [name] (DOB 1955/12/20 ) via telephone on 11/19/2023 at 12:38 PM and verified that I was speaking with the correct person using more than one form of identification.  We discussed the reason(s) for conducting our visit virtually instead of in-person.  The patient expressed understanding the circumstances and agreed to proceed as described.  [name] reported having a lot of productive coughing back in January when he had the flu but this is now resolved.  No changes in his breathing. We discussed the findings of the most recent chest CT showing no evidence of recurrent malignancy.  Questions were answered to his satisfaction.  Current Outpatient Medications  Medication Sig Dispense Refill   albuterol (VENTOLIN HFA) 108 (90 Base) MCG/ACT inhaler Inhale 1-2 puffs into the lungs every 6 (six) hours as needed for wheezing or shortness of breath. 18 g 0   azithromycin (ZITHROMAX) 250 MG tablet 2 tabs poqday1, 1 tab poqday 2-5 6 tablet 0   carboxymethylcellul-glycerin (OPTIVE) 0.5-0.9 % ophthalmic solution Place 1 drop into both eyes daily as needed for dry eyes. refresh     clopidogrel (PLAVIX) 75 MG tablet TAKE 1 TABLET(75 MG) BY MOUTH DAILY 30 tablet 11   fluticasone (FLONASE) 50 MCG/ACT nasal spray Place 2 sprays into both nostrils daily. 16 g 6   furosemide (LASIX) 20 MG tablet Take 1 tablet (20 mg total) by mouth daily as needed (shortness of breath). 30 tablet 11   hydrochlorothiazide (HYDRODIURIL) 25 MG tablet Take 1 tablet (25 mg total) by mouth daily as needed (edema). 30 tablet 1   levocetirizine (XYZAL ALLERGY 24HR) 5 MG tablet Take 1 tablet (5 mg total) by mouth every evening. 30 tablet 3    losartan (COZAAR) 100 MG tablet TAKE 1 TABLET(100 MG) BY MOUTH DAILY 90 tablet 2   metoprolol succinate (TOPROL-XL) 25 MG 24 hr tablet TAKE 1/2 TABLET BY MOUTH ONCE DAILY. 45 tablet 1   nitroGLYCERIN (NITROSTAT) 0.4 MG SL tablet Place 1 tablet (0.4 mg total) under the tongue every 5 (five) minutes as needed for chest pain. 25 tablet 5   oseltamivir (TAMIFLU) 75 MG capsule Take 1 capsule (75 mg total) by mouth 2 (two) times daily. 10 capsule 0   PARoxetine (PAXIL) 40 MG tablet TAKE (1) TABLET BY MOUTH ONCE DAILY. 90 tablet 1   REPATHA SURECLICK 140 MG/ML SOAJ ADMINISTER 1 ML UNDER THE SKIN EVERY 14 DAYS. 2 mL 9   sulfamethoxazole-trimethoprim (BACTRIM DS) 800-160 MG tablet Take 1 tablet by mouth 2 (two) times daily. 14 tablet 0   valACYclovir (VALTREX) 1000 MG tablet Take 1 tablet (1,000 mg total) by mouth 3 (three) times daily. 21 tablet 0   Current Facility-Administered Medications  Medication Dose Route Frequency Provider Last Rate Last Admin   albuterol (PROVENTIL) (2.5 MG/3ML) 0.083% nebulizer solution 2.5 mg  2.5 mg Nebulization Once Valentino Nose, NP         Diagnostic Tests:  CLINICAL DATA:  Follow-up lung cancer, lobectomy. * Tracking Code: BO *.   EXAM: CT CHEST WITHOUT CONTRAST   TECHNIQUE: Multidetector CT imaging of the chest was  performed following the standard protocol without IV contrast.   RADIATION DOSE REDUCTION: This exam was performed according to the departmental dose-optimization program which includes automated exposure control, adjustment of the mA and/or kV according to patient size and/or use of iterative reconstruction technique.   COMPARISON:  X-ray 06/13/2023 and older.  CT 11/16/2022 and older.   FINDINGS: Cardiovascular: Thoracic aorta is normal course and caliber with some calcified atherosclerotic plaque. Coronary artery calcifications are seen. Please correlate for other coronary risk factors. Heart is nonenlarged. No pericardial  effusion.   Mediastinum/Nodes: Slightly patulous thoracic esophagus. Small calcification in the right thyroid lobe. No specific abnormal lymph node enlargement identified in the axillary regions, hilum or mediastinum. There are some small lymph nodes identified such as pre-vascular measuring 7 mm in short axis on series 4, image 132. These are stable.   Lungs/Pleura: There is some increasing basilar atelectasis. No consolidation, pneumothorax or effusion. Few apical subpleural blebs at the right lung apex. Surgical changes of left-sided lobectomy. There is some volume loss along left hemithorax. Stable small ground-glass areas left lung measuring 4 mm is on series 3, image 53. Unchanged from the most recent prior is also was vaguely seen on the study September 2022.   Upper Abdomen: The adrenal glands are preserved in the upper abdomen. Left-sided renal cysts identified. Please correlate with prior imaging.   Musculoskeletal: Diffusion changes of the spine with bridging osteophytes and syndesmophytes.   IMPRESSION: Stable postsurgical changes from left-sided lobectomy. No developing true soft tissue mass, nodal enlargement.   There is a small 4 mm ground-glass nodule in the left upper lobe similar to the most recent prior.   Aortic Atherosclerosis (ICD10-I70.0).     Electronically Signed   By: Karen Kays M.D.   On: 11/12/2023 16:26   Impression:  The most recent CT scan on 11/12/2023 shows no evidence of recurrent malignancy.  Plan:  Annual follow-up with chest CT scan is recommended.  Will schedule for 10/2024.    I discussed limitations of evaluation and management via telephone.  The patient was advised to call back for repeat telephone consultation or to seek an in-person evaluation if questions arise or the patient's clinical condition changes in any significant manner.  I spent in excess of 15 minutes of non-face-to-face time during the conduct of this  telephone virtual office consultation.  Level 1  (99441)             5-10 minutes Level 2  (99442)            11-20 minutes Level 3  (99443)            21-30 minutes   Leary Roca, PA-C  11/19/2023 12:38 PM

## 2023-12-20 ENCOUNTER — Ambulatory Visit: Admitting: Family Medicine

## 2023-12-20 ENCOUNTER — Encounter: Payer: Self-pay | Admitting: Family Medicine

## 2023-12-20 VITALS — BP 142/76 | HR 76 | Temp 98.3°F | Ht 73.0 in | Wt 309.0 lb

## 2023-12-20 DIAGNOSIS — G8929 Other chronic pain: Secondary | ICD-10-CM | POA: Diagnosis not present

## 2023-12-20 DIAGNOSIS — M25561 Pain in right knee: Secondary | ICD-10-CM

## 2023-12-20 DIAGNOSIS — M25562 Pain in left knee: Secondary | ICD-10-CM | POA: Diagnosis not present

## 2023-12-20 MED ORDER — TRIAMCINOLONE ACETONIDE 40 MG/ML IJ SUSP
80.0000 mg | Freq: Once | INTRAMUSCULAR | Status: AC
Start: 2023-12-20 — End: 2023-12-20
  Administered 2023-12-20: 80 mg via INTRA_ARTICULAR

## 2023-12-20 MED ORDER — MECLIZINE HCL 25 MG PO TABS: 25.0000 mg | ORAL_TABLET | Freq: Three times a day (TID) | ORAL | 0 refills | Status: AC | PRN

## 2023-12-20 NOTE — Progress Notes (Signed)
 Subjective:    Patient ID: [name], male    DOB: August 10, 1956, 68 y.o.   MRN: [id number] Patient has bilateral knee pain.  He has end-stage osteoarthritis of both knees.  He has been receiving cortisone injections.  Last injection of both knees was in December.  His knees are causing constant pain.  He is requesting a cortisone injection in both knees.  He is unable to take NSAIDs due to his history with coronary artery disease and antiplatelet therapy.  He also has been having vertigo for the last few days.  He rolls over in bed or looks down, the room starts to spin. Past Medical History:  Diagnosis Date   Arthritis of knee    seeing Dr. Rendell Carrel   Cancer [facility])    Lung   Coronary artery disease    Depression with anxiety    hx of   Exogenous obesity    History of sleep apnea    Hyperlipidemia    Hypertension    Panic attacks    hx of   Sleep apnea    Uses a Cpap   Smoker unmotivated to quit    Varicose veins    Past Surgical History:  Procedure Laterality Date   CARDIAC CATHETERIZATION  09/06/2008   stent to mid LAD and proximal LAD   COLONOSCOPY N/A 05/01/2013   Procedure: COLONOSCOPY;  Surgeon: Alyce Jubilee, MD;  [facility];  Service: Endoscopy;  Laterality: N/A;  8:30 AM   EYE SURGERY  2006   Lasik on both eyes   INTERCOSTAL NERVE BLOCK Left 03/09/2020   Procedure: INTERCOSTAL NERVE BLOCK;  Surgeon: Hilarie Lovely, MD;  Location: [facility] OR;  Service: Thoracic;  Laterality: Left;   LOBECTOMY  03/09/2020   XI ROBOTIC ASSISTED THORASCOPY - LOWER LEFT LOBE (LLL) WEDGE RESECTION AND LLL LOBECTOMY (Left)   LYMPH NODE DISSECTION N/A 03/09/2020   Procedure: LYMPH NODE DISSECTION;  Surgeon: Hilarie Lovely, MD;  Location: [facility] OR;  Service: Thoracic;  Laterality: N/A;   polyps of lung  2008   removed by [facility]   TONSILLECTOMY     VIDEO BRONCHOSCOPY N/A 03/09/2020   Procedure: VIDEO BRONCHOSCOPY;  Surgeon: Hilarie Lovely, MD;  Location: [facility]  OR;  Service: Thoracic;  Laterality: N/A;   Current Outpatient Medications on File Prior to Visit  Medication Sig Dispense Refill   albuterol  (VENTOLIN  HFA) 108 (90 Base) MCG/ACT inhaler Inhale 1-2 puffs into the lungs every 6 (six) hours as needed for wheezing or shortness of breath. 18 g 0   carboxymethylcellul-glycerin (OPTIVE) 0.5-0.9 % ophthalmic solution Place 1 drop into both eyes daily as needed for dry eyes. refresh     clopidogrel  (PLAVIX ) 75 MG tablet TAKE 1 TABLET(75 MG) BY MOUTH DAILY 30 tablet 11   fluticasone  (FLONASE ) 50 MCG/ACT nasal spray Place 2 sprays into both nostrils daily. 16 g 6   furosemide  (LASIX ) 20 MG tablet Take 1 tablet (20 mg total) by mouth daily as needed (shortness of breath). 30 tablet 11   hydrochlorothiazide  (HYDRODIURIL ) 25 MG tablet Take 1 tablet (25 mg total) by mouth daily as needed (edema). 30 tablet 1   levocetirizine (XYZAL  ALLERGY 24HR) 5 MG tablet Take 1 tablet (5 mg total) by mouth every evening. 30 tablet 3   losartan  (COZAAR ) 100 MG tablet TAKE 1 TABLET(100 MG) BY MOUTH DAILY 90 tablet 2   metoprolol  succinate (TOPROL -XL) 25 MG 24 hr tablet TAKE 1/2 TABLET BY MOUTH ONCE  DAILY. 45 tablet 1   nitroGLYCERIN  (NITROSTAT ) 0.4 MG SL tablet Place 1 tablet (0.4 mg total) under the tongue every 5 (five) minutes as needed for chest pain. 25 tablet 5   PARoxetine  (PAXIL ) 40 MG tablet TAKE (1) TABLET BY MOUTH ONCE DAILY. 90 tablet 1   REPATHA  SURECLICK 140 MG/ML SOAJ ADMINISTER 1 ML UNDER THE SKIN EVERY 14 DAYS. 2 mL 9   No current facility-administered medications on file prior to visit.   No Known Allergies Social History   Socioeconomic History   Marital status: Married    Spouse name: Not on file   Number of children: Not on file   Years of education: Not on file   Highest education level: Associate degree: occupational, Scientist, product/process development, or vocational program  Occupational History   Occupation: [employer]  Tobacco Use   Smoking status: Every Day     Current packs/day: 0.50    Average packs/day: 0.5 packs/day for 52.3 years (26.2 ttl pk-yrs)    Types: Cigarettes    Start date: 1973   Smokeless tobacco: Never  Vaping Use   Vaping status: Never Used  Substance and Sexual Activity   Alcohol use: Yes    Alcohol/week: 0.0 standard drinks of alcohol    Comment: occasionally   Drug use: No   Sexual activity: Not on file  Other Topics Concern   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   Social Drivers of Health   Financial Resource Strain: Patient Declined (09/25/2023)   Overall Financial Resource Strain (CARDIA)    Difficulty of Paying Living Expenses: Patient declined  Food Insecurity: Patient Declined (09/25/2023)   Hunger Vital Sign    Worried About Running Out of Food in the Last Year: Patient declined    Ran Out of Food in the Last Year: Patient declined  Transportation Needs: Patient Declined (09/25/2023)   PRAPARE - Administrator, Civil Service (Medical): Patient declined    Lack of Transportation (Non-Medical): Patient declined  Physical Activity: Unknown (09/25/2023)   Exercise Vital Sign    Days of Exercise per Week: 0 days    Minutes of Exercise per Session: Not on file  Stress: No Stress Concern Present (09/25/2023)   [employer] of Occupational Health - Occupational Stress Questionnaire    Feeling of Stress : Only a little  Social Connections: Moderately Isolated (09/25/2023)   Social Connection and Isolation Panel [NHANES]    Frequency of Communication with Friends and Family: Twice a week    Frequency of Social Gatherings with Friends and Family: More than three times a week    Attends Religious Services: Never    Database administrator or Organizations: No    Attends Engineer, structural: Not on file    Marital Status: Married  Catering manager Violence: Not At Risk (06/04/2023)   Received from [facility]   HITS    Over the last 12 months  how often did your partner physically hurt you?: Never    Over the last 12 months how often did your partner insult you or talk down to you?: Never    Over the last 12 months how often did your partner threaten you with physical harm?: Never    Over the last 12 months how often did your partner scream or curse at you?: Never       Review of Systems     Objective:  Physical Exam Vitals reviewed.  Constitutional:      General: He is not in acute distress.    Appearance: He is well-developed. He is not diaphoretic.  Cardiovascular:     Rate and Rhythm: Normal rate and regular rhythm.     Heart sounds: Normal heart sounds. No murmur heard. Pulmonary:     Effort: Pulmonary effort is normal. No respiratory distress.     Breath sounds: Normal breath sounds. No wheezing or rales.  Chest:     Chest wall: No tenderness.  Musculoskeletal:     Right knee: No effusion. Decreased range of motion. Tenderness present over the medial joint line and lateral joint line.     Left knee: No effusion. Decreased range of motion. Tenderness present over the medial joint line and lateral joint line.          Chronic pain of both knees  Using sterile technique, I injected the right knee with 2 cc of lidocaine , 2 cc of Marcaine , and 2 cc of 40 mg/mL Kenalog .  The patient tolerated this injection well without complication.  I repeated the exact same injection in the left knee without complication.  Start meclizine  25 mg every 8 hours as needed for vertigo.

## 2023-12-20 NOTE — Addendum Note (Signed)
 Addended by: Gillermo Lack K on: 12/20/2023 11:23 AM   Modules accepted: Orders

## 2023-12-26 ENCOUNTER — Other Ambulatory Visit

## 2023-12-26 DIAGNOSIS — Z125 Encounter for screening for malignant neoplasm of prostate: Secondary | ICD-10-CM | POA: Diagnosis not present

## 2023-12-26 DIAGNOSIS — F17200 Nicotine dependence, unspecified, uncomplicated: Secondary | ICD-10-CM

## 2023-12-26 DIAGNOSIS — E7800 Pure hypercholesterolemia, unspecified: Secondary | ICD-10-CM

## 2023-12-26 DIAGNOSIS — I10 Essential (primary) hypertension: Secondary | ICD-10-CM | POA: Diagnosis not present

## 2023-12-27 ENCOUNTER — Encounter: Payer: Self-pay | Admitting: Cardiovascular Disease

## 2023-12-27 ENCOUNTER — Other Ambulatory Visit: Payer: Self-pay

## 2023-12-27 LAB — COMPLETE METABOLIC PANEL WITHOUT GFR
AG Ratio: 1.9 (calc) (ref 1.0–2.5)
ALT: 15 U/L (ref 9–46)
AST: 12 U/L (ref 10–35)
Albumin: 4.5 g/dL (ref 3.6–5.1)
Alkaline phosphatase (APISO): 59 U/L (ref 35–144)
BUN: 15 mg/dL (ref 7–25)
CO2: 27 mmol/L (ref 20–32)
Calcium: 9.7 mg/dL (ref 8.6–10.3)
Chloride: 102 mmol/L (ref 98–110)
Creat: 0.87 mg/dL (ref 0.70–1.35)
Globulin: 2.4 g/dL (ref 1.9–3.7)
Glucose, Bld: 104 mg/dL — ABNORMAL HIGH (ref 65–99)
Potassium: 4.2 mmol/L (ref 3.5–5.3)
Sodium: 138 mmol/L (ref 135–146)
Total Bilirubin: 0.7 mg/dL (ref 0.2–1.2)
Total Protein: 6.9 g/dL (ref 6.1–8.1)

## 2023-12-27 LAB — CBC WITH DIFFERENTIAL/PLATELET
Absolute Lymphocytes: 2179 {cells}/uL (ref 850–3900)
Absolute Monocytes: 640 {cells}/uL (ref 200–950)
Basophils Absolute: 49 {cells}/uL (ref 0–200)
Basophils Relative: 0.6 %
Eosinophils Absolute: 97 {cells}/uL (ref 15–500)
Eosinophils Relative: 1.2 %
HCT: 50.8 % — ABNORMAL HIGH (ref 38.5–50.0)
Hemoglobin: 16.8 g/dL (ref 13.2–17.1)
MCH: 32.7 pg (ref 27.0–33.0)
MCHC: 33.1 g/dL (ref 32.0–36.0)
MCV: 99 fL (ref 80.0–100.0)
MPV: 8.7 fL (ref 7.5–12.5)
Monocytes Relative: 7.9 %
Neutro Abs: 5135 {cells}/uL (ref 1500–7800)
Neutrophils Relative %: 63.4 %
Platelets: 158 10*3/uL (ref 140–400)
RBC: 5.13 10*6/uL (ref 4.20–5.80)
RDW: 12.4 % (ref 11.0–15.0)
Total Lymphocyte: 26.9 %
WBC: 8.1 10*3/uL (ref 3.8–10.8)

## 2023-12-27 LAB — LIPID PANEL
Cholesterol: 200 mg/dL — ABNORMAL HIGH (ref <200)
HDL: 44 mg/dL (ref >=40)
LDL Cholesterol (Calc): 138 mg/dL — ABNORMAL HIGH
Non-HDL Cholesterol (Calc): 156 mg/dL — ABNORMAL HIGH (ref <130)
Total CHOL/HDL Ratio: 4.5 (calc) (ref <5.0)
Triglycerides: 81 mg/dL (ref <150)

## 2023-12-27 LAB — PSA: PSA: 0.96 ng/mL (ref <=4.00)

## 2023-12-27 MED ORDER — LOSARTAN POTASSIUM 100 MG PO TABS: 100.0000 mg | ORAL_TABLET | Freq: Every day | ORAL | 1 refills | Status: AC

## 2023-12-27 MED ORDER — CLOPIDOGREL BISULFATE 75 MG PO TABS: 75.0000 mg | ORAL_TABLET | Freq: Every day | ORAL | 1 refills | Status: AC

## 2023-12-27 MED ORDER — METOPROLOL SUCCINATE ER 25 MG PO TB24: 12.5000 mg | ORAL_TABLET | Freq: Every day | ORAL | 1 refills | Status: DC

## 2023-12-27 MED ORDER — METOPROLOL SUCCINATE ER 25 MG PO TB24: 12.5000 mg | ORAL_TABLET | Freq: Every day | ORAL | 1 refills | Status: AC

## 2024-01-03 ENCOUNTER — Ambulatory Visit (INDEPENDENT_AMBULATORY_CARE_PROVIDER_SITE_OTHER): Admitting: Family Medicine

## 2024-01-03 ENCOUNTER — Encounter: Payer: Self-pay | Admitting: Family Medicine

## 2024-01-03 VITALS — BP 132/78 | HR 60 | Temp 97.9°F | Ht 73.0 in | Wt 304.0 lb

## 2024-01-03 DIAGNOSIS — Z125 Encounter for screening for malignant neoplasm of prostate: Secondary | ICD-10-CM

## 2024-01-03 DIAGNOSIS — I2510 Atherosclerotic heart disease of native coronary artery without angina pectoris: Secondary | ICD-10-CM

## 2024-01-03 DIAGNOSIS — I10 Essential (primary) hypertension: Secondary | ICD-10-CM | POA: Diagnosis not present

## 2024-01-03 DIAGNOSIS — Z0000 Encounter for general adult medical examination without abnormal findings: Secondary | ICD-10-CM

## 2024-01-03 DIAGNOSIS — E7800 Pure hypercholesterolemia, unspecified: Secondary | ICD-10-CM

## 2024-01-03 DIAGNOSIS — Z85118 Personal history of other malignant neoplasm of bronchus and lung: Secondary | ICD-10-CM

## 2024-01-03 DIAGNOSIS — Z0001 Encounter for general adult medical examination with abnormal findings: Secondary | ICD-10-CM

## 2024-01-03 NOTE — Progress Notes (Signed)
 Subjective:    Patient ID: [name], male    DOB: 1956-07-12, 68 y.o.   MRN: [id number]  HPI  Patient is a pleasant 68 y/o WM here today for complete physical exam.  Has a history of lung cancer s/p LLL lobectomy for stage 1A.  History of CAD with stenting in the mid LAD.  Also has a history of OSA with AHI 43.   Patient has CT scan of his lung in March of this year.  The results are included below: IMPRESSION: Stable postsurgical changes from left-sided lobectomy. No developing true soft tissue mass, nodal enlargement.   There is a small 4 mm ground-glass nodule in the left upper lobe similar to the most recent prior.   Aortic Atherosclerosis (ICD10-I70.0).  Cologuard was negative in November 2023.  It is due again in November 2026.  Patient had a PSA first of this year which was normal.  Please see lab work below Lab on 12/26/2023  Component Date Value Ref Range Status   WBC 12/26/2023 8.1  3.8 - 10.8 Thousand/uL Final   RBC 12/26/2023 5.13  4.20 - 5.80 Million/uL Final   Hemoglobin 12/26/2023 16.8  13.2 - 17.1 g/dL Final   HCT 40/98/1191 50.8 (H)  38.5 - 50.0 % Final   MCV 12/26/2023 99.0  80.0 - 100.0 fL Final   MCH 12/26/2023 32.7  27.0 - 33.0 pg Final   MCHC 12/26/2023 33.1  32.0 - 36.0 g/dL Final   Comment: For adults, a slight decrease in the calculated MCHC value (in the range of 30 to 32 g/dL) is most likely not clinically significant; however, it should be interpreted with caution in correlation with other red cell parameters and the patient's clinical condition.    RDW 12/26/2023 12.4  11.0 - 15.0 % Final   Platelets 12/26/2023 158  140 - 400 Thousand/uL Final   MPV 12/26/2023 8.7  7.5 - 12.5 fL Final   Neutro Abs 12/26/2023 5,135  1,500 - 7,800 cells/uL Final   Absolute Lymphocytes 12/26/2023 2,179  850 - 3,900 cells/uL Final   Absolute Monocytes 12/26/2023 640  200 - 950 cells/uL Final   Eosinophils Absolute 12/26/2023 97  15 - 500 cells/uL Final    Basophils Absolute 12/26/2023 49  0 - 200 cells/uL Final   Neutrophils Relative % 12/26/2023 63.4  % Final   Total Lymphocyte 12/26/2023 26.9  % Final   Monocytes Relative 12/26/2023 7.9  % Final   Eosinophils Relative 12/26/2023 1.2  % Final   Basophils Relative 12/26/2023 0.6  % Final   Glucose, Bld 12/26/2023 104 (H)  65 - 99 mg/dL Final   Comment: .            Fasting reference interval . For someone without known diabetes, a glucose value between 100 and 125 mg/dL is consistent with prediabetes and should be confirmed with a follow-up test. .    BUN 12/26/2023 15  7 - 25 mg/dL Final   Creat 47/82/9562 0.87  0.70 - 1.35 mg/dL Final   BUN/Creatinine Ratio 12/26/2023 SEE NOTE:  6 - 22 (calc) Final   Comment:    Not Reported: BUN and Creatinine are within    reference range. .    Sodium 12/26/2023 138  135 - 146 mmol/L Final   Potassium 12/26/2023 4.2  3.5 - 5.3 mmol/L Final   Chloride 12/26/2023 102  98 - 110 mmol/L Final   CO2 12/26/2023 27  20 - 32 mmol/L Final  Calcium  12/26/2023 9.7  8.6 - 10.3 mg/dL Final   Total Protein 21/30/8657 6.9  6.1 - 8.1 g/dL Final   Albumin 84/69/6295 4.5  3.6 - 5.1 g/dL Final   Globulin 28/41/3244 2.4  1.9 - 3.7 g/dL (calc) Final   AG Ratio 12/26/2023 1.9  1.0 - 2.5 (calc) Final   Total Bilirubin 12/26/2023 0.7  0.2 - 1.2 mg/dL Final   Alkaline phosphatase (APISO) 12/26/2023 59  35 - 144 U/L Final   AST 12/26/2023 12  10 - 35 U/L Final   ALT 12/26/2023 15  9 - 46 U/L Final   Cholesterol 12/26/2023 200 (H)  <200 mg/dL Final   HDL 08/29/7251 44  > OR = 40 mg/dL Final   Triglycerides 66/44/0347 81  <150 mg/dL Final   LDL Cholesterol (Calc) 12/26/2023 138 (H)  mg/dL (calc) Final   Comment: Reference range: <100 . Desirable range <100 mg/dL for primary prevention;   <70 mg/dL for patients with CHD or diabetic patients  with > or = 2 CHD risk factors. Aaron Aas LDL-C is now calculated using the Martin-Hopkins  calculation, which is a validated  novel method providing  better accuracy than the Friedewald equation in the  estimation of LDL-C.  Melinda Sprawls et al. Erroll Heard. 4259;563(87): 2061-2068  (http://education.[facility].com/faq/FAQ164)    Total CHOL/HDL Ratio 12/26/2023 4.5  <5.6 (calc) Final   Non-HDL Cholesterol (Calc) 12/26/2023 156 (H)  <130 mg/dL (calc) Final   Comment: For patients with diabetes plus 1 major ASCVD risk  factor, treating to a non-HDL-C goal of <100 mg/dL  (LDL-C of <43 mg/dL) is considered a therapeutic  option.    PSA 12/26/2023 0.96  < OR = 4.00 ng/mL Final   Comment: The total PSA value from this assay system is  standardized against the WHO standard. The test  result will be approximately 20% lower when compared  to the equimolar-standardized total PSA (Beckman  Coulter). Comparison of serial PSA results should be  interpreted with this fact in mind. . This test was performed using the Siemens  chemiluminescent method. Values obtained from  different assay methods cannot be used interchangeably. PSA levels, regardless of value, should not be interpreted as absolute evidence of the presence or absence of disease.    Immunization History  Administered Date(s) Administered   PNEUMOCOCCAL CONJUGATE-20 05/14/2022   Pneumococcal Polysaccharide-23 12/21/2015   Tdap 02/25/2013, 05/14/2022   Patient appears to be due for the shingles vaccine.  Patient states that for several months prior to having his labs drawn, he had stopped his medication.  This includes Plavix , losartan , metoprolol , and Repatha .  He did this because he was concerned the losartan  could be contributing to edema and he felt the metoprolol  was causing fatigue.  Therefore his cholesterol is without him taking Repatha  Past Medical History:  Diagnosis Date   Arthritis of knee    seeing Dr. Rendell Carrel   Cancer [facility])    Lung   Coronary artery disease    Depression with anxiety    hx of   Exogenous obesity    History of sleep apnea     Hyperlipidemia    Hypertension    Panic attacks    hx of   Sleep apnea    Uses a Cpap   Smoker unmotivated to quit    Varicose veins    Past Surgical History:  Procedure Laterality Date   CARDIAC CATHETERIZATION  09/06/2008   stent to mid LAD and proximal LAD   COLONOSCOPY N/A 05/01/2013  Procedure: COLONOSCOPY;  Surgeon: Alyce Jubilee, MD;  [facility];  Service: Endoscopy;  Laterality: N/A;  8:30 AM   EYE SURGERY  2006   Lasik on both eyes   INTERCOSTAL NERVE BLOCK Left 03/09/2020   Procedure: INTERCOSTAL NERVE BLOCK;  Surgeon: Hilarie Lovely, MD;  Location: [facility] OR;  Service: Thoracic;  Laterality: Left;   LOBECTOMY  03/09/2020   XI ROBOTIC ASSISTED THORASCOPY - LOWER LEFT LOBE (LLL) WEDGE RESECTION AND LLL LOBECTOMY (Left)   LYMPH NODE DISSECTION N/A 03/09/2020   Procedure: LYMPH NODE DISSECTION;  Surgeon: Hilarie Lovely, MD;  [facility]: [facility] OR;  Service: Thoracic;  Laterality: N/A;   polyps of lung  2008   removed by [facility]   TONSILLECTOMY     VIDEO BRONCHOSCOPY N/A 03/09/2020   Procedure: VIDEO BRONCHOSCOPY;  Surgeon: Hilarie Lovely, MD;  Location: [facility] OR;  Service: Thoracic;  Laterality: N/A;   Current Outpatient Medications on File Prior to Visit  Medication Sig Dispense Refill   albuterol  (VENTOLIN  HFA) 108 (90 Base) MCG/ACT inhaler Inhale 1-2 puffs into the lungs every 6 (six) hours as needed for wheezing or shortness of breath. 18 g 0   carboxymethylcellul-glycerin (OPTIVE) 0.5-0.9 % ophthalmic solution Place 1 drop into both eyes daily as needed for dry eyes. refresh     clopidogrel  (PLAVIX ) 75 MG tablet Take 1 tablet (75 mg total) by mouth daily. 90 tablet 1   fluticasone  (FLONASE ) 50 MCG/ACT nasal spray Place 2 sprays into both nostrils daily. 16 g 6   furosemide  (LASIX ) 20 MG tablet Take 1 tablet (20 mg total) by mouth daily as needed (shortness of breath). 30 tablet 11   hydrochlorothiazide  (HYDRODIURIL ) 25 MG tablet Take 1 tablet  (25 mg total) by mouth daily as needed (edema). 30 tablet 1   levocetirizine (XYZAL  ALLERGY 24HR) 5 MG tablet Take 1 tablet (5 mg total) by mouth every evening. 30 tablet 3   losartan  (COZAAR ) 100 MG tablet Take 1 tablet (100 mg total) by mouth daily. 90 tablet 1   meclizine  (ANTIVERT ) 25 MG tablet Take 1 tablet (25 mg total) by mouth 3 (three) times daily as needed. 30 tablet 0   metoprolol  succinate (TOPROL -XL) 25 MG 24 hr tablet Take 0.5 tablets (12.5 mg total) by mouth daily. 45 tablet 1   nitroGLYCERIN  (NITROSTAT ) 0.4 MG SL tablet Place 1 tablet (0.4 mg total) under the tongue every 5 (five) minutes as needed for chest pain. 25 tablet 5   PARoxetine  (PAXIL ) 40 MG tablet TAKE (1) TABLET BY MOUTH ONCE DAILY. 90 tablet 1   REPATHA  SURECLICK 140 MG/ML SOAJ ADMINISTER 1 ML UNDER THE SKIN EVERY 14 DAYS. 2 mL 9   No current facility-administered medications on file prior to visit.   No Known Allergies Social History   Socioeconomic History   Marital status: Married    Spouse name: Not on file   Number of children: Not on file   Years of education: Not on file   Highest education level: Associate degree: occupational, Scientist, product/process development, or vocational program  Occupational History   Occupation: [employer]  Tobacco Use   Smoking status: Every Day    Current packs/day: 0.50    Average packs/day: 0.5 packs/day for 52.4 years (26.2 ttl pk-yrs)    Types: Cigarettes    Start date: 1973   Smokeless tobacco: Never  Vaping Use   Vaping status: Never Used  Substance and Sexual Activity   Alcohol use: Yes  Alcohol/week: 0.0 standard drinks of alcohol    Comment: occasionally   Drug use: No   Sexual activity: Not on file  Other Topics Concern   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   Social Drivers of Health   Financial Resource Strain: Patient Declined (09/25/2023)   Overall Financial Resource Strain (CARDIA)    Difficulty of Paying Living  Expenses: Patient declined  Food Insecurity: Patient Declined (09/25/2023)   Hunger Vital Sign    Worried About Running Out of Food in the Last Year: Patient declined    Ran Out of Food in the Last Year: Patient declined  Transportation Needs: Patient Declined (09/25/2023)   PRAPARE - Administrator, Civil Service (Medical): Patient declined    Lack of Transportation (Non-Medical): Patient declined  Physical Activity: Unknown (09/25/2023)   Exercise Vital Sign    Days of Exercise per Week: 0 days    Minutes of Exercise per Session: Not on file  Stress: No Stress Concern Present (09/25/2023)   [employer] of Occupational Health - Occupational Stress Questionnaire    Feeling of Stress : Only a little  Social Connections: Moderately Isolated (09/25/2023)   Social Connection and Isolation Panel [NHANES]    Frequency of Communication with Friends and Family: Twice a week    Frequency of Social Gatherings with Friends and Family: More than three times a week    Attends Religious Services: Never    Database administrator or Organizations: No    Attends Engineer, structural: Not on file    Marital Status: Married  Catering manager Violence: Not At Risk (06/04/2023)   Received from [facility]   HITS    Over the last 12 months how often did your partner physically hurt you?: Never    Over the last 12 months how often did your partner insult you or talk down to you?: Never    Over the last 12 months how often did your partner threaten you with physical harm?: Never    Over the last 12 months how often did your partner scream or curse at you?: Never    Family History  Problem Relation Age of Onset   Heart disease Father        had cabg   Esophageal cancer Father 9       esophageal   Lung cancer Brother        lung cancer   Colon cancer Neg Hx       Review of Systems     Objective:   Physical Exam Vitals reviewed.  Constitutional:      General: He is  not in acute distress.    Appearance: He is well-developed. He is not diaphoretic.  HENT:     Head: Normocephalic and atraumatic.     Right Ear: Tympanic membrane, ear canal and external ear normal.     Left Ear: Tympanic membrane, ear canal and external ear normal.     Nose: Nose normal.     Mouth/Throat:     Pharynx: No oropharyngeal exudate.  Eyes:     General: No scleral icterus.       Right eye: No discharge.        Left eye: No discharge.     Conjunctiva/sclera: Conjunctivae normal.     Pupils: Pupils are equal, round, and reactive to light.  Neck:     Thyroid: No thyromegaly.  Vascular: No JVD.     Trachea: No tracheal deviation.  Cardiovascular:     Rate and Rhythm: Normal rate and regular rhythm.     Heart sounds: Normal heart sounds. No murmur heard.    No friction rub. No gallop.  Pulmonary:     Effort: Pulmonary effort is normal. No respiratory distress.     Breath sounds: Normal breath sounds. No stridor. No decreased breath sounds, wheezing or rales.  Chest:     Chest wall: No tenderness.  Abdominal:     General: Bowel sounds are normal. There is no distension.     Palpations: Abdomen is soft. There is no mass.     Tenderness: There is no abdominal tenderness. There is no guarding or rebound.  Musculoskeletal:        General: No tenderness or deformity. Normal range of motion.     Cervical back: Normal range of motion and neck supple.     Right lower leg: Edema present.     Left lower leg: Edema present.  Lymphadenopathy:     Cervical: No cervical adenopathy.  Skin:    General: Skin is warm.     Coloration: Skin is not pale.     Findings: No erythema or rash.  Neurological:     Mental Status: He is alert and oriented to person, place, and time.     Cranial Nerves: No cranial nerve deficit.     Motor: No abnormal muscle tone.     Coordination: Coordination normal.     Deep Tendon Reflexes: Reflexes are normal and symmetric.  Psychiatric:         Behavior: Behavior normal.        Thought Content: Thought content normal.        Judgment: Judgment normal.         Assessment & Plan:  General medical exam  Essential hypertension  Hypercholesterolemia  History of lung cancer  Prostate cancer screening  Coronary artery disease involving native coronary artery of native heart without angina pectoris We had a long discussion today about the importance of his medication.  I explained to him that we want to keep his LDL cholesterol below 55.  I explained that losartan  and metoprolol  help to prevent future heart attacks.  I explained that Plavix  is meant to help prevent clotting near unstable plaque in his blood that can cause a heart attack or stroke.  Therefore I encouraged him to take his medication and to quit smoking.  I would like to recheck his cholesterol in 3 months on the Repatha .  He politely declined the shingles vaccine today.  Cologuard is due next year.  His next lung cancer screening will be due next March.  His PSA was outstanding.

## 2024-03-08 ENCOUNTER — Other Ambulatory Visit: Payer: Self-pay | Admitting: Family Medicine

## 2024-03-08 DIAGNOSIS — F410 Panic disorder [episodic paroxysmal anxiety] without agoraphobia: Secondary | ICD-10-CM

## 2024-03-27 ENCOUNTER — Ambulatory Visit: Admitting: Family Medicine

## 2024-04-09 ENCOUNTER — Encounter: Payer: Self-pay | Admitting: Family Medicine

## 2024-04-09 ENCOUNTER — Ambulatory Visit (INDEPENDENT_AMBULATORY_CARE_PROVIDER_SITE_OTHER): Admitting: Family Medicine

## 2024-04-09 VITALS — BP 142/72 | HR 67 | Temp 97.7°F | Ht 73.0 in | Wt 306.2 lb

## 2024-04-09 DIAGNOSIS — M25562 Pain in left knee: Secondary | ICD-10-CM

## 2024-04-09 DIAGNOSIS — M25561 Pain in right knee: Secondary | ICD-10-CM

## 2024-04-09 DIAGNOSIS — G8929 Other chronic pain: Secondary | ICD-10-CM | POA: Diagnosis not present

## 2024-04-09 MED ORDER — TRIAMCINOLONE ACETONIDE 40 MG/ML IJ SUSP
80.0000 mg | Freq: Once | INTRAMUSCULAR | Status: AC
Start: 2024-04-09 — End: 2024-04-09
  Administered 2024-04-09: 80 mg via INTRA_ARTICULAR

## 2024-04-09 NOTE — Addendum Note (Signed)
 Addended by: ANGELENA RONAL BRADLEY K on: 04/09/2024 12:20 PM   Modules accepted: Orders

## 2024-04-09 NOTE — Progress Notes (Signed)
 Subjective:    Patient ID: [name], male    DOB: [birth year], 68 y.o.   MRN: [id number] Patient has bilateral knee pain.  He has end-stage osteoarthritis of both knees.  He has been receiving cortisone injections.  Last injection of both knees was in April.  His knees are causing constant pain.  He is requesting a cortisone injection in both knees.  He is unable to take NSAIDs due to his history with coronary artery disease and antiplatelet therapy.   Past Medical History:  Diagnosis Date   Arthritis of knee    seeing Dr. Tess   Cancer [facility])    Lung   Coronary artery disease    Depression with anxiety    hx of   Exogenous obesity    History of sleep apnea    Hyperlipidemia    Hypertension    Panic attacks    hx of   Sleep apnea    Uses a Cpap   Smoker unmotivated to quit    Varicose veins    Past Surgical History:  Procedure Laterality Date   CARDIAC CATHETERIZATION  09/06/2008   stent to mid LAD and proximal LAD   COLONOSCOPY N/A 05/01/2013   Procedure: COLONOSCOPY;  Surgeon: Margo LITTIE Haddock, MD;  [facility];  Service: Endoscopy;  Laterality: N/A;  8:30 AM   EYE SURGERY  2006   Lasik on both eyes   INTERCOSTAL NERVE BLOCK Left 03/09/2020   Procedure: INTERCOSTAL NERVE BLOCK;  Surgeon: Shyrl Linnie KIDD, MD;  Location: [facility] OR;  Service: Thoracic;  Laterality: Left;   LOBECTOMY  03/09/2020   XI ROBOTIC ASSISTED THORASCOPY - LOWER LEFT LOBE (LLL) WEDGE RESECTION AND LLL LOBECTOMY (Left)   LYMPH NODE DISSECTION N/A 03/09/2020   Procedure: LYMPH NODE DISSECTION;  Surgeon: Shyrl Linnie KIDD, MD;  [facility];  Service: Thoracic;  Laterality: N/A;   polyps of lung  2008   removed by  Pulmonary   TONSILLECTOMY     VIDEO BRONCHOSCOPY N/A 03/09/2020   Procedure: VIDEO BRONCHOSCOPY;  Surgeon: Shyrl Linnie KIDD, MD;  [facility];  Service: Thoracic;  Laterality: N/A;   Current Outpatient Medications on File Prior to Visit  Medication Sig  Dispense Refill   albuterol (VENTOLIN HFA) 108 (90 Base) MCG/ACT inhaler Inhale 1-2 puffs into the lungs every 6 (six) hours as needed for wheezing or shortness of breath. 18 g 0   carboxymethylcellul-glycerin (OPTIVE) 0.5-0.9 % ophthalmic solution Place 1 drop into both eyes daily as needed for dry eyes. refresh     clopidogrel (PLAVIX) 75 MG tablet Take 1 tablet (75 mg total) by mouth daily. 90 tablet 1   fluticasone (FLONASE) 50 MCG/ACT nasal spray Place 2 sprays into both nostrils daily. 16 g 6   furosemide (LASIX) 20 MG tablet Take 1 tablet (20 mg total) by mouth daily as needed (shortness of breath). 30 tablet 11   levocetirizine (XYZAL ALLERGY 24HR) 5 MG tablet Take 1 tablet (5 mg total) by mouth every evening. 30 tablet 3   losartan (COZAAR) 100 MG tablet Take 1 tablet (100 mg total) by mouth daily. 90 tablet 1   meclizine (ANTIVERT) 25 MG tablet Take 1 tablet (25 mg total) by mouth 3 (three) times daily as needed. 30 tablet 0   metoprolol succinate (TOPROL-XL) 25 MG 24 hr tablet Take 0.5 tablets (12.5 mg total) by mouth daily. 45 tablet 1   nitroGLYCERIN (NITROSTAT) 0.4 MG SL tablet Place 1 tablet (0.4  mg total) under the tongue every 5 (five) minutes as needed for chest pain. 25 tablet 5   PARoxetine (PAXIL) 40 MG tablet TAKE (1) TABLET BY MOUTH ONCE DAILY. 90 tablet 1   REPATHA SURECLICK 140 MG/ML SOAJ ADMINISTER 1 ML UNDER THE SKIN EVERY 14 DAYS. 2 mL 9   No current facility-administered medications on file prior to visit.   No Known Allergies Social History   Socioeconomic History   Marital status: Married    Spouse name: Not on file   Number of children: Not on file   Years of education: Not on file   Highest education level: Associate degree: academic program  Occupational History   Occupation: [employer]  Tobacco Use   Smoking status: Every Day    Current packs/day: 0.50    Average packs/day: 0.5 packs/day for 52.6 years (26.3 ttl pk-yrs)    Types: Cigarettes     Start date: 1973   Smokeless tobacco: Never  Vaping Use   Vaping status: Never Used  Substance and Sexual Activity   Alcohol use: Yes    Alcohol/week: 0.0 standard drinks of alcohol    Comment: occasionally   Drug use: No   Sexual activity: Not on file  Other Topics Concern   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   Social Drivers of Corporate investment banker Strain: Low Risk  (03/24/2024)   Overall Financial Resource Strain (CARDIA)    Difficulty of Paying Living Expenses: Not hard at all  Food Insecurity: No Food Insecurity (03/24/2024)   Hunger Vital Sign    Worried About Running Out of Food in the Last Year: Never true    Ran Out of Food in the Last Year: Never true  Transportation Needs: No Transportation Needs (03/24/2024)   PRAPARE - Administrator, Civil Service (Medical): No    Lack of Transportation (Non-Medical): No  Physical Activity: Inactive (03/24/2024)   Exercise Vital Sign    Days of Exercise per Week: 0 days    Minutes of Exercise per Session: Not on file  Stress: No Stress Concern Present (03/24/2024)   [employer] of Occupational Health - Occupational Stress Questionnaire    Feeling of Stress: Not at all  Social Connections: Moderately Isolated (03/24/2024)   Social Connection and Isolation Panel    Frequency of Communication with Friends and Family: More than three times a week    Frequency of Social Gatherings with Friends and Family: Three times a week    Attends Religious Services: Never    Active Member of Clubs or Organizations: No    Attends Banker Meetings: Not on file    Marital Status: Married  Intimate Partner Violence: Not At Risk (06/04/2023)   Received from [facility]   HITS    Over the last 12 months how often did your partner physically hurt you?: Never    Over the last 12 months how often did your partner insult you or talk down to you?: Never    Over the last  12 months how often did your partner threaten you with physical harm?: Never    Over the last 12 months how often did your partner scream or curse at you?: Never       Review of Systems     Objective:   Physical Exam Vitals reviewed.  Constitutional:      General: He is not in acute  distress.    Appearance: He is well-developed. He is not diaphoretic.  Cardiovascular:     Rate and Rhythm: Normal rate and regular rhythm.     Heart sounds: Normal heart sounds. No murmur heard. Pulmonary:     Effort: Pulmonary effort is normal. No respiratory distress.     Breath sounds: Normal breath sounds. No wheezing or rales.  Chest:     Chest wall: No tenderness.  Musculoskeletal:     Right knee: No effusion. Decreased range of motion. Tenderness present over the medial joint line and lateral joint line.     Left knee: No effusion. Decreased range of motion. Tenderness present over the medial joint line and lateral joint line.          Chronic pain of both knees  Using sterile technique, I injected the right knee with 2 cc of lidocaine, 2 cc of Marcaine, and 2 cc of 40 mg/mL Kenalog.  The patient tolerated this injection well without complication.  I repeated the exact same injection in the left knee without complication.

## 2024-05-18 ENCOUNTER — Encounter: Payer: Self-pay | Admitting: Cardiovascular Disease

## 2024-05-19 MED ORDER — FUROSEMIDE 20 MG PO TABS: 20.0000 mg | ORAL_TABLET | Freq: Every day | ORAL | 1 refills | Status: AC | PRN

## 2024-07-13 ENCOUNTER — Ambulatory Visit: Admitting: Family Medicine

## 2024-07-13 DIAGNOSIS — J329 Chronic sinusitis, unspecified: Secondary | ICD-10-CM | POA: Diagnosis not present

## 2024-07-13 MED ORDER — AMOXICILLIN 875 MG PO TABS: 875.0000 mg | ORAL_TABLET | Freq: Two times a day (BID) | ORAL | 0 refills | Status: AC

## 2024-07-13 MED ORDER — FLUTICASONE PROPIONATE 50 MCG/ACT NA SUSP: 2.0000 | Freq: Every day | NASAL | 6 refills | Status: AC

## 2024-07-13 NOTE — Progress Notes (Signed)
 Subjective:    Patient ID: [name], male    DOB: August 06, 1956, 68 y.o.   MRN: [id number]   Patient reports a 1 week history of head congestion and rhinorrhea.  He now has pain and pressure in his right ear along with some vertigo.  He states that he is having pressure in his right frontal and his right maxillary sinus as well.  He denies any wheezing or shortness of breath.  He denies any sore throat.  Past Medical History:  Diagnosis Date   Arthritis of knee    seeing Dr. Tess   Cancer [facility])    Lung   Coronary artery disease    Depression with anxiety    hx of   Exogenous obesity    History of sleep apnea    Hyperlipidemia    Hypertension    Panic attacks    hx of   Sleep apnea    Uses a Cpap   Smoker unmotivated to quit    Varicose veins    Past Surgical History:  Procedure Laterality Date   CARDIAC CATHETERIZATION  09/06/2008   stent to mid LAD and proximal LAD   COLONOSCOPY N/A 05/01/2013   Procedure: COLONOSCOPY;  Surgeon: Margo LITTIE Haddock, MD;  [facility];  Service: Endoscopy;  Laterality: N/A;  8:30 AM   EYE SURGERY  2006   Lasik on both eyes   INTERCOSTAL NERVE BLOCK Left 03/09/2020   Procedure: INTERCOSTAL NERVE BLOCK;  Surgeon: Shyrl Linnie KIDD, MD;  [facility];  Service: Thoracic;  Laterality: Left;   LOBECTOMY  03/09/2020   XI ROBOTIC ASSISTED THORASCOPY - LOWER LEFT LOBE (LLL) WEDGE RESECTION AND LLL LOBECTOMY (Left)   LYMPH NODE DISSECTION N/A 03/09/2020   Procedure: LYMPH NODE DISSECTION;  Surgeon: Shyrl Linnie KIDD, MD;  [facility];  Service: Thoracic;  Laterality: N/A;   polyps of lung  2008   removed by [facility]   TONSILLECTOMY     VIDEO BRONCHOSCOPY N/A 03/09/2020   Procedure: VIDEO BRONCHOSCOPY;  Surgeon: Shyrl Linnie KIDD, MD;  [facility];  Service: Thoracic;  Laterality: N/A;   Current Outpatient Medications on File Prior to Visit  Medication Sig Dispense Refill   albuterol  (VENTOLIN  HFA) 108 (90  Base) MCG/ACT inhaler Inhale 1-2 puffs into the lungs every 6 (six) hours as needed for wheezing or shortness of breath. 18 g 0   carboxymethylcellul-glycerin (OPTIVE) 0.5-0.9 % ophthalmic solution Place 1 drop into both eyes daily as needed for dry eyes. refresh     clopidogrel  (PLAVIX ) 75 MG tablet Take 1 tablet (75 mg total) by mouth daily. 90 tablet 1   furosemide  (LASIX ) 20 MG tablet Take 1 tablet (20 mg total) by mouth daily as needed (shortness of breath). 30 tablet 1   levocetirizine (XYZAL  ALLERGY 24HR) 5 MG tablet Take 1 tablet (5 mg total) by mouth every evening. 30 tablet 3   losartan  (COZAAR ) 100 MG tablet Take 1 tablet (100 mg total) by mouth daily. 90 tablet 1   meclizine  (ANTIVERT ) 25 MG tablet Take 1 tablet (25 mg total) by mouth 3 (three) times daily as needed. 30 tablet 0   metoprolol  succinate (TOPROL -XL) 25 MG 24 hr tablet Take 0.5 tablets (12.5 mg total) by mouth daily. 45 tablet 1   nitroGLYCERIN  (NITROSTAT ) 0.4 MG SL tablet Place 1 tablet (0.4 mg total) under the tongue every 5 (five) minutes as needed for chest pain. 25 tablet 5   PARoxetine  (PAXIL )  40 MG tablet TAKE (1) TABLET BY MOUTH ONCE DAILY. 90 tablet 1   REPATHA  SURECLICK 140 MG/ML SOAJ ADMINISTER 1 ML UNDER THE SKIN EVERY 14 DAYS. 2 mL 9   No current facility-administered medications on file prior to visit.   No Known Allergies Social History   Socioeconomic History   Marital status: Married    Spouse name: Not on file   Number of children: Not on file   Years of education: Not on file   Highest education level: Associate degree: occupational, scientist, product/process development, or vocational program  Occupational History   Occupation: [employer]  Tobacco Use   Smoking status: Every Day    Current packs/day: 0.50    Average packs/day: 0.5 packs/day for 52.9 years (26.4 ttl pk-yrs)    Types: Cigarettes    Start date: 1973   Smokeless tobacco: Never  Vaping Use   Vaping status: Never Used  Substance and Sexual Activity    Alcohol use: Yes    Alcohol/week: 0.0 standard drinks of alcohol    Comment: occasionally   Drug use: No   Sexual activity: Not on file  Other Topics Concern   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   Social Drivers of Corporate Investment Banker Strain: Low Risk  (07/13/2024)   Overall Financial Resource Strain (CARDIA)    Difficulty of Paying Living Expenses: Not very hard  Food Insecurity: Food Insecurity Present (07/13/2024)   Hunger Vital Sign    Worried About Running Out of Food in the Last Year: Sometimes true    Ran Out of Food in the Last Year: Never true  Transportation Needs: No Transportation Needs (07/13/2024)   PRAPARE - Administrator, Civil Service (Medical): No    Lack of Transportation (Non-Medical): No  Physical Activity: Inactive (07/13/2024)   Exercise Vital Sign    Days of Exercise per Week: 0 days    Minutes of Exercise per Session: Not on file  Stress: No Stress Concern Present (07/13/2024)   [employer] of Occupational Health - Occupational Stress Questionnaire    Feeling of Stress: Not at all  Social Connections: Moderately Isolated (07/13/2024)   Social Connection and Isolation Panel    Frequency of Communication with Friends and Family: More than three times a week    Frequency of Social Gatherings with Friends and Family: Twice a week    Attends Religious Services: Never    Database Administrator or Organizations: No    Attends Engineer, Structural: Not on file    Marital Status: Married  Catering Manager Violence: Not At Risk (06/04/2023)   Received from [facility]   HITS    Over the last 12 months how often did your partner physically hurt you?: Never    Over the last 12 months how often did your partner insult you or talk down to you?: Never    Over the last 12 months how often did your partner threaten you with physical harm?: Never    Over the last 12 months how  often did your partner scream or curse at you?: Never       Review of Systems  Respiratory:  Positive for cough.        Objective:   Physical Exam Vitals reviewed.  Constitutional:      General: He is not in acute distress.    Appearance: He is well-developed. He is not  diaphoretic.  HENT:     Right Ear: Tympanic membrane and ear canal normal.     Left Ear: Tympanic membrane and ear canal normal.     Nose: Mucosal edema, congestion and rhinorrhea present.     Right Turbinates: Swollen.     Left Turbinates: Enlarged and swollen.     Right Sinus: Maxillary sinus tenderness and frontal sinus tenderness present.     Mouth/Throat:     Pharynx: No oropharyngeal exudate.  Cardiovascular:     Rate and Rhythm: Normal rate and regular rhythm.     Heart sounds: Normal heart sounds. No murmur heard. Pulmonary:     Effort: Pulmonary effort is normal. No respiratory distress.     Breath sounds: Normal breath sounds. No wheezing or rales.  Chest:     Chest wall: No tenderness.  Musculoskeletal:     Cervical back: Neck supple.  Lymphadenopathy:     Cervical: No cervical adenopathy.           Rhinosinusitis - Plan: fluticasone  (FLONASE ) 50 MCG/ACT nasal spray  Begin amoxicillin  875 mg twice daily for 10 days.  Add Flonase  2 sprays each nostril daily.

## 2024-07-14 NOTE — Progress Notes (Signed)
 Date:  07/14/2024   ID:  [name], [name] [birth year], MRN [id number]  PCP:  [name] Butler DASEN, MD  Cardiologist:  [name] Emmer, MD   Electrophysiologist:  None   Evaluation Performed:  Follow-Up Visit  Chief Complaint:  CAD/ HLD   History of Present Illness:    68 y.o. 2010 had stenting proximal and mid LAD with DES by Dr Jordan. Non ischemic myovue 04/24/16 Had myalgias on crestor  and lipitor Now on Repatha   Varicose veins post ablative Rx by Dr Gerlean. 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 Smoker over 45 pack year not interested in Chantix   Helping to raise 32 yo grandson   Has some sciatica Rx with prednisone  improved   Last visit we ordered lung cancer screening CT which identified LLL cancer He is no post LLL lobectomy by Dr [name] Stage 1A no metastatic disease   Wearing CPAP Supposed to have swallow study for dysphagia   No angina still working with [name] at [facility]  Finally saw [facility] in [place] and had ablative Rx of his veins with improvement Discussed Ozempic for short term to lose weight as he may need knee replacements next year  Recent shingles over left side   Has nitroglycerin  that's less than a year old     Past Medical History:  Diagnosis Date   Arthritis of knee    seeing Dr. Tess   Cancer [facility])    Lung   Coronary artery disease    Depression with anxiety    hx of   Exogenous obesity    History of sleep apnea    Hyperlipidemia    Hypertension    Panic attacks    hx of   Sleep apnea    Uses a Cpap   Smoker unmotivated to quit    Varicose veins    Past Surgical History:  Procedure Laterality Date   CARDIAC CATHETERIZATION  09/06/2008   stent to mid LAD and proximal LAD   COLONOSCOPY N/A 05/01/2013   Procedure: COLONOSCOPY;  Surgeon: [name] LITTIE Haddock, MD;  [facility];  Service: Endoscopy;  Laterality: N/A;  8:30 AM   EYE SURGERY  2006   Lasik on both eyes   INTERCOSTAL NERVE BLOCK Left 03/09/2020   Procedure:  INTERCOSTAL NERVE BLOCK;  Surgeon: [name] Linnie KIDD, MD;  [facility];  Service: Thoracic;  Laterality: Left;   LOBECTOMY  03/09/2020   XI ROBOTIC ASSISTED THORASCOPY - LOWER LEFT LOBE (LLL) WEDGE RESECTION AND LLL LOBECTOMY (Left)   LYMPH NODE DISSECTION N/A 03/09/2020   Procedure: LYMPH NODE DISSECTION;  Surgeon: [name] Linnie KIDD, MD;  [facility];  Service: Thoracic;  Laterality: N/A;   polyps of lung  2008   removed by [facility]   TONSILLECTOMY     VIDEO BRONCHOSCOPY N/A 03/09/2020   Procedure: VIDEO BRONCHOSCOPY;  Surgeon: [name] Linnie KIDD, MD;  [facility];  Service: Thoracic;  Laterality: N/A;     No outpatient medications have been marked as taking for the 07/27/24 encounter (Appointment) with [name] Ruble C, MD.     Allergies:   Patient has no known allergies.   Social History   Tobacco Use   Smoking status: Every Day    Current packs/day: 0.50    Average packs/day: 0.5 packs/day for 52.9 years (26.4 ttl pk-yrs)    Types: Cigarettes    Start date: 30   Smokeless tobacco: Never  Vaping Use   Vaping  status: Never Used  Substance Use Topics   Alcohol use: Yes    Alcohol/week: 0.0 standard drinks of alcohol    Comment: occasionally   Drug use: No     Family Hx: The patient's family history includes Esophageal cancer (age of onset: 55) in his father; Heart disease in his father; Lung cancer in his brother. There is no history of Colon cancer.  ROS:   Please see the history of present illness.     All other systems reviewed and are negative.   Prior CV studies:   The following studies were reviewed today:  Myovue 04/24/16  Labs/Other Tests and Data Reviewed:    EKG:  07/27/2024 SR LVH nonspecific ST changes   Recent Labs: 12/26/2023: ALT 15; BUN 15; Creat 0.87; Hemoglobin 16.8; Platelets 158; Potassium 4.2; Sodium 138   Recent Lipid Panel Lab Results  Component Value Date/Time   CHOL 200 (H) 12/26/2023 08:02 AM   CHOL 151  12/31/2017 08:20 AM   TRIG 81 12/26/2023 08:02 AM   HDL 44 12/26/2023 08:02 AM   HDL 31 (L) 12/31/2017 08:20 AM   CHOLHDL 4.5 12/26/2023 08:02 AM   LDLCALC 138 (H) 12/26/2023 08:02 AM   LDLDIRECT 178 (H) 11/18/2017 04:28 PM    Wt Readings from Last 3 Encounters:  07/13/24 (!) 306 lb (138.8 kg)  04/09/24 (!) 306 lb 3.2 oz (138.9 kg)  01/03/24 (!) 304 lb (137.9 kg)     Objective:    Vital Signs:  There were no vitals taken for this visit.   Affect appropriate Obese white male  HEENT: normal Neck supple with no adenopathy JVP normal no bruits no thyromegaly Lungs exp wheezing post LLL lobectomy  S1/S2 no murmur, no rub, gallop or click PMI normal Abdomen: benighn, BS positve, no tenderness, no AAA no bruit.  No HSM or HJR Distal pulses intact with no bruits LE varicosities right >left  improved with compression hose on now Neuro non-focal Skin warm and dry No muscular weakness   ASSESSMENT & PLAN:    1. CAD: stenting LAD 2010 non ischemic myovue 2017 no chest pain continue medical Rx 2. HLD:  LDL 138 on labs 12/26/23 supposed to be on repatha  discussed with him 3. Obesity:  Discussed low carb diet and portion control Ozempic per primary if weight loss needed before TKR;s 4.  Bilateral varicose veins.  F/U [facility] post ablative Rx with chronic stasis no ulcers improved continue lasix   5. Ortho: post arthroscopic surgery on left with improvement f/u Dr IVAR [name] Still may need TKRls  Last cortisone injections 04/09/24  6. Smoking:  lung cancer CT 01/13/20 with 8 mm lesion LLL, positive PET now post LLL lobectomy 03/09/20  f/u pulmonary and oncology CT 11/12/23 no recurrent cancer stable small 4 mm nodule in LUL Has albuterol  inhaler.   Medication Adjustments/Labs and Tests Ordered: Current medicines are reviewed at length with the patient today.  Concerns regarding medicines are outlined above.   Tests Ordered:  None   Medication Changes: No orders of the defined types were  placed in this encounter.    Disposition:  Follow up in a year   Signed, [name] Emmer, MD  07/14/2024 1:50 PM    [facility]

## 2024-07-27 ENCOUNTER — Ambulatory Visit: Attending: Cardiovascular Disease | Admitting: Cardiovascular Disease

## 2024-07-27 ENCOUNTER — Encounter: Payer: Self-pay | Admitting: Cardiovascular Disease

## 2024-07-27 VITALS — BP 136/88 | HR 64 | Ht 73.0 in | Wt 311.4 lb

## 2024-07-27 DIAGNOSIS — I259 Chronic ischemic heart disease, unspecified: Secondary | ICD-10-CM

## 2024-07-27 DIAGNOSIS — I2510 Atherosclerotic heart disease of native coronary artery without angina pectoris: Secondary | ICD-10-CM | POA: Diagnosis not present

## 2024-07-27 DIAGNOSIS — I10 Essential (primary) hypertension: Secondary | ICD-10-CM | POA: Diagnosis not present

## 2024-07-27 DIAGNOSIS — E785 Hyperlipidemia, unspecified: Secondary | ICD-10-CM | POA: Diagnosis not present

## 2024-07-27 NOTE — Patient Instructions (Signed)
 Medication Instructions:  Your physician recommends that you continue on your current medications as directed. Please refer to the Current Medication list given to you today.  *If you need a refill on your cardiac medications before your next appointment, please call your pharmacy*  Lab Work: NONE ordered at this time of appointment   Testing/Procedures: NONE ordered at this time of appointment   Follow-Up: At [facility], you and your health needs are our priority.  As part of our continuing mission to provide you with exceptional heart care, our providers are all part of one team.  This team includes your primary Cardiologist (physician) and Advanced Practice Providers or APPs (Physician Assistants and Nurse Practitioners) who all work together to provide you with the care you need, when you need it.  Your next appointment:   1 year(s)  Provider:   Maude Emmer, MD    We recommend signing up for the patient portal called MyChart.  Sign up information is provided on this After Visit Summary.  MyChart is used to connect with patients for Virtual Visits (Telemedicine).  Patients are able to view lab/test results, encounter notes, upcoming appointments, etc.  Non-urgent messages can be sent to your provider as well.   To learn more about what you can do with MyChart, go to [email or web address].

## 2024-08-06 ENCOUNTER — Ambulatory Visit: Admitting: Family Medicine

## 2024-08-06 ENCOUNTER — Telehealth: Payer: Self-pay | Admitting: Pharmacy Technician

## 2024-08-06 ENCOUNTER — Other Ambulatory Visit (HOSPITAL_COMMUNITY): Payer: Self-pay

## 2024-08-06 ENCOUNTER — Encounter: Payer: Self-pay | Admitting: Family Medicine

## 2024-08-06 VITALS — BP 140/76 | HR 75 | Temp 97.6°F | Ht 73.0 in | Wt 311.0 lb

## 2024-08-06 DIAGNOSIS — G4733 Obstructive sleep apnea (adult) (pediatric): Secondary | ICD-10-CM

## 2024-08-06 DIAGNOSIS — G8929 Other chronic pain: Secondary | ICD-10-CM

## 2024-08-06 DIAGNOSIS — M25562 Pain in left knee: Secondary | ICD-10-CM | POA: Diagnosis not present

## 2024-08-06 DIAGNOSIS — M25561 Pain in right knee: Secondary | ICD-10-CM | POA: Diagnosis not present

## 2024-08-06 MED ORDER — TRIAMCINOLONE ACETONIDE 40 MG/ML IJ SUSP
80.0000 mg | Freq: Once | INTRAMUSCULAR | Status: AC
  Administered 2024-08-06: 80 mg via INTRA_ARTICULAR

## 2024-08-06 MED ORDER — TIRZEPATIDE-WEIGHT MANAGEMENT 2.5 MG/0.5ML ~~LOC~~ SOLN: 2.5000 mg | SUBCUTANEOUS | 3 refills | Status: AC

## 2024-08-06 NOTE — Telephone Encounter (Signed)
 Pharmacy Patient Advocate Encounter   Received notification from Onbase that prior authorization for Zepbound 2.5MG /0.5ML pen-injectors  is required/requested.   Insurance verification completed.   The patient is insured through [facility].   Per test claim: PA required; PA started via CoverMyMeds. KEY [id number] . Waiting for clinical questions to populate.

## 2024-08-06 NOTE — Addendum Note (Signed)
 Addended by: ANGELENA RONAL BRADLEY K on: 08/06/2024 11:29 AM   Modules accepted: Orders

## 2024-08-06 NOTE — Progress Notes (Signed)
 Subjective:    Patient ID: [name], male    DOB: August 07, 1956, 68 y.o.   MRN: [id number] Patient has bilateral knee pain.  He has end-stage osteoarthritis of both knees.  He has been receiving cortisone injections.  Last injection of both knees was in 04/09/24.  His knees are causing constant pain.  He is requesting a cortisone injection in both knees.  He is unable to take NSAIDs due to his history with coronary artery disease and antiplatelet therapy.   Past Medical History:  Diagnosis Date   Arthritis of knee    seeing Dr. Tess   Cancer [facility])    Lung   Coronary artery disease    Depression with anxiety    hx of   Exogenous obesity    History of sleep apnea    Hyperlipidemia    Hypertension    Panic attacks    hx of   Sleep apnea    Uses a Cpap   Smoker unmotivated to quit    Varicose veins    Past Surgical History:  Procedure Laterality Date   CARDIAC CATHETERIZATION  09/06/2008   stent to mid LAD and proximal LAD   COLONOSCOPY N/A 05/01/2013   Procedure: COLONOSCOPY;  Surgeon: Margo LITTIE Haddock, MD;  Location: [facility];  Service: Endoscopy;  Laterality: N/A;  8:30 AM   EYE SURGERY  2006   Lasik on both eyes   INTERCOSTAL NERVE BLOCK Left 03/09/2020   Procedure: INTERCOSTAL NERVE BLOCK;  Surgeon: Shyrl Linnie KIDD, MD;  Location: [facility] OR;  Service: Thoracic;  Laterality: Left;   LOBECTOMY  03/09/2020   XI ROBOTIC ASSISTED THORASCOPY - LOWER LEFT LOBE (LLL) WEDGE RESECTION AND LLL LOBECTOMY (Left)   LYMPH NODE DISSECTION N/A 03/09/2020   Procedure: LYMPH NODE DISSECTION;  Surgeon: Shyrl Linnie KIDD, MD;  Location: [facility] OR;  Service: Thoracic;  Laterality: N/A;   polyps of lung  2008   removed by [facility]   TONSILLECTOMY     VIDEO BRONCHOSCOPY N/A 03/09/2020   Procedure: VIDEO BRONCHOSCOPY;  Surgeon: Shyrl Linnie KIDD, MD;  Location: [facility] OR;  Service: Thoracic;  Laterality: N/A;   Current Outpatient Medications on File Prior to Visit  Medication Sig  Dispense Refill   albuterol  (VENTOLIN  HFA) 108 (90 Base) MCG/ACT inhaler Inhale 1-2 puffs into the lungs every 6 (six) hours as needed for wheezing or shortness of breath. 18 g 0   carboxymethylcellul-glycerin (OPTIVE) 0.5-0.9 % ophthalmic solution Place 1 drop into both eyes daily as needed for dry eyes. refresh     clopidogrel  (PLAVIX ) 75 MG tablet Take 1 tablet (75 mg total) by mouth daily. 90 tablet 1   fluticasone  (FLONASE ) 50 MCG/ACT nasal spray Place 2 sprays into both nostrils daily. 16 g 6   furosemide  (LASIX ) 20 MG tablet Take 1 tablet (20 mg total) by mouth daily as needed (shortness of breath). 30 tablet 1   levocetirizine (XYZAL  ALLERGY 24HR) 5 MG tablet Take 1 tablet (5 mg total) by mouth every evening. 30 tablet 3   losartan  (COZAAR ) 100 MG tablet Take 1 tablet (100 mg total) by mouth daily. 90 tablet 1   meclizine  (ANTIVERT ) 25 MG tablet Take 1 tablet (25 mg total) by mouth 3 (three) times daily as needed. 30 tablet 0   metoprolol  succinate (TOPROL -XL) 25 MG 24 hr tablet Take 0.5 tablets (12.5 mg total) by mouth daily. 45 tablet 1   nitroGLYCERIN  (NITROSTAT ) 0.4 MG SL tablet Place 1 tablet (0.4  mg total) under the tongue every 5 (five) minutes as needed for chest pain. 25 tablet 5   PARoxetine  (PAXIL ) 40 MG tablet TAKE (1) TABLET BY MOUTH ONCE DAILY. 90 tablet 1   REPATHA  SURECLICK 140 MG/ML SOAJ ADMINISTER 1 ML UNDER THE SKIN EVERY 14 DAYS. 2 mL 9   No current facility-administered medications on file prior to visit.   No Known Allergies Social History   Socioeconomic History   Marital status: Married    Spouse name: Not on file   Number of children: Not on file   Years of education: Not on file   Highest education level: Associate degree: occupational, scientist, product/process development, or vocational program  Occupational History   Occupation: [employer]  Tobacco Use   Smoking status: Every Day    Current packs/day: 0.50    Average packs/day: 0.5 packs/day for 52.9 years (26.5 ttl  pk-yrs)    Types: Cigarettes    Start date: 1973   Smokeless tobacco: Never   Tobacco comments:    1 PPD  Vaping Use   Vaping status: Never Used  Substance and Sexual Activity   Alcohol use: Yes    Alcohol/week: 0.0 standard drinks of alcohol    Comment: occasionally   Drug use: No   Sexual activity: Not on file  Other Topics Concern   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   Social Drivers of Health   Tobacco Use: High Risk (07/27/2024)   Patient History    Smoking Tobacco Use: Every Day    Smokeless Tobacco Use: Never    Passive Exposure: Not on file  Financial Resource Strain: Low Risk (07/13/2024)   Overall Financial Resource Strain (CARDIA)    Difficulty of Paying Living Expenses: Not very hard  Food Insecurity: Food Insecurity Present (07/13/2024)   Epic    Worried About Programme Researcher, Broadcasting/film/video in the Last Year: Sometimes true    Ran Out of Food in the Last Year: Never true  Transportation Needs: No Transportation Needs (07/13/2024)   Epic    Lack of Transportation (Medical): No    Lack of Transportation (Non-Medical): No  Physical Activity: Inactive (07/13/2024)   Exercise Vital Sign    Days of Exercise per Week: 0 days    Minutes of Exercise per Session: Not on file  Stress: No Stress Concern Present (07/13/2024)   [employer] of Occupational Health - Occupational Stress Questionnaire    Feeling of Stress: Not at all  Social Connections: Moderately Isolated (07/13/2024)   Social Connection and Isolation Panel    Frequency of Communication with Friends and Family: More than three times a week    Frequency of Social Gatherings with Friends and Family: Twice a week    Attends Religious Services: Never    Database Administrator or Organizations: No    Attends Engineer, Structural: Not on file    Marital Status: Married  Catering Manager Violence: Not At Risk (06/04/2023)   Received from [facility]   HITS     Over the last 12 months how often did your partner physically hurt you?: Never    Over the last 12 months how often did your partner insult you or talk down to you?: Never    Over the last 12 months how often did your partner threaten you with physical harm?: Never    Over the last 12 months how often did your partner scream  or curse at you?: Never  Depression (PHQ2-9): Low Risk (07/13/2024)   Depression (PHQ2-9)    PHQ-2 Score: 0  Alcohol Screen: Low Risk (03/27/2023)   Alcohol Screen    Last Alcohol Screening Score (AUDIT): 1  Housing: Unknown (07/13/2024)   Epic    Unable to Pay for Housing in the Last Year: No    Number of Times Moved in the Last Year: Not on file    Homeless in the Last Year: No  Utilities: Patient Declined (06/04/2023)   Received from [facility] Utilities    Threatened with loss of utilities: Patient declined  Health Literacy: Not on file       Review of Systems     Objective:   Physical Exam Vitals reviewed.  Constitutional:      General: He is not in acute distress.    Appearance: He is well-developed. He is not diaphoretic.  Cardiovascular:     Rate and Rhythm: Normal rate and regular rhythm.     Heart sounds: Normal heart sounds. No murmur heard. Pulmonary:     Effort: Pulmonary effort is normal. No respiratory distress.     Breath sounds: Normal breath sounds. No wheezing or rales.  Chest:     Chest wall: No tenderness.  Musculoskeletal:     Right knee: No effusion. Decreased range of motion. Tenderness present over the medial joint line and lateral joint line.     Left knee: No effusion. Decreased range of motion. Tenderness present over the medial joint line and lateral joint line.          Chronic pain of both knees  Obstructive sleep apnea syndrome    Using sterile technique, I injected the right knee with 2 cc of lidocaine , 2 cc of Marcaine , and 2 cc of 40 mg/mL Kenalog .  The patient tolerated this injection well  without complication.  I repeated the exact same injection in the left knee without complication.    Patient has a longstanding history of severe obstructive sleep apnea.  Patient had a sleep study in 2000 with an apnea hypopnea index above 45.  He would benefit significantly from weight loss due to the severe pain in both of his knees.  I will send in a prescription for Zepbound 2.5 mg subcu weekly and uptitrate as tolerated to try to help achieve weight loss for his sleep apnea as well as for his chronic bilateral knee pain

## 2024-08-07 NOTE — Telephone Encounter (Signed)
 Pharmacy Patient Advocate Encounter  Received notification from [facility] that Prior Authorization for Zepbound 2.5MG /0.5ML pen-injectors has been DENIED.  Full denial letter will be uploaded to the media tab. See denial reason below.  **The primary and only ICD 10 code used was G47.33 OSA and his plan still denied the PA**   PA #/Case ID/Reference #: [id number]

## 2024-08-10 ENCOUNTER — Other Ambulatory Visit (HOSPITAL_COMMUNITY): Payer: Self-pay

## 2024-08-10 ENCOUNTER — Ambulatory Visit: Admitting: Family Medicine

## 2024-08-27 ENCOUNTER — Other Ambulatory Visit: Payer: Self-pay | Admitting: Cardiovascular Disease

## 2024-08-27 DIAGNOSIS — I2510 Atherosclerotic heart disease of native coronary artery without angina pectoris: Secondary | ICD-10-CM

## 2024-08-27 DIAGNOSIS — E785 Hyperlipidemia, unspecified: Secondary | ICD-10-CM

## 2024-09-13 ENCOUNTER — Encounter: Payer: Self-pay | Admitting: Family Medicine

## 2024-09-15 ENCOUNTER — Other Ambulatory Visit: Payer: Self-pay | Admitting: Family Medicine

## 2024-09-15 MED ORDER — AMOXICILLIN 875 MG PO TABS: 875.0000 mg | ORAL_TABLET | Freq: Two times a day (BID) | ORAL | 0 refills | Status: AC

## 2024-09-30 ENCOUNTER — Other Ambulatory Visit: Payer: Self-pay | Admitting: Family Medicine

## 2024-09-30 DIAGNOSIS — F410 Panic disorder [episodic paroxysmal anxiety] without agoraphobia: Secondary | ICD-10-CM
